# Patient Record
Sex: Female | Born: 1960 | Race: Black or African American | Hispanic: No | Marital: Married | Smoking: Never smoker
Health system: Southern US, Community
[De-identification: ages and names within clinical notes are randomized; demographics above are authoritative.]

## PROBLEM LIST (undated history)

## (undated) DIAGNOSIS — K219 Gastro-esophageal reflux disease without esophagitis: Secondary | ICD-10-CM

## (undated) DIAGNOSIS — H18609 Keratoconus, unspecified, unspecified eye: Secondary | ICD-10-CM

## (undated) DIAGNOSIS — I10 Essential (primary) hypertension: Secondary | ICD-10-CM

## (undated) DIAGNOSIS — E7800 Pure hypercholesterolemia, unspecified: Secondary | ICD-10-CM

## (undated) DIAGNOSIS — B019 Varicella without complication: Secondary | ICD-10-CM

## (undated) DIAGNOSIS — H269 Unspecified cataract: Secondary | ICD-10-CM

## (undated) HISTORY — DX: Unspecified cataract: H26.9

## (undated) HISTORY — PX: CORNEAL TRANSPLANT: SHX108

## (undated) HISTORY — DX: Varicella without complication: B01.9

## (undated) HISTORY — DX: Gastro-esophageal reflux disease without esophagitis: K21.9

## (undated) HISTORY — PX: BREAST SURGERY: SHX581

## (undated) HISTORY — PX: EYE SURGERY: SHX253

## (undated) HISTORY — DX: Keratoconus, unspecified, unspecified eye: H18.609

---

## 2012-02-09 ENCOUNTER — Emergency Department (HOSPITAL_COMMUNITY)
Admission: EM | Admit: 2012-02-09 | Discharge: 2012-02-09 | Disposition: A | Discharge status: Home / Self Care | Payer: BC Managed Care – PPO | Attending: Emergency Medicine | Admitting: Emergency Medicine

## 2012-02-09 ENCOUNTER — Emergency Department (HOSPITAL_COMMUNITY): Payer: BC Managed Care – PPO

## 2012-02-09 ENCOUNTER — Encounter (HOSPITAL_COMMUNITY): Payer: Self-pay | Admitting: Emergency Medicine

## 2012-02-09 DIAGNOSIS — I10 Essential (primary) hypertension: Secondary | ICD-10-CM | POA: Insufficient documentation

## 2012-02-09 DIAGNOSIS — S0520XA Ocular laceration and rupture with prolapse or loss of intraocular tissue, unspecified eye, initial encounter: Secondary | ICD-10-CM | POA: Insufficient documentation

## 2012-02-09 DIAGNOSIS — E7800 Pure hypercholesterolemia, unspecified: Secondary | ICD-10-CM | POA: Insufficient documentation

## 2012-02-09 DIAGNOSIS — T07XXXA Unspecified multiple injuries, initial encounter: Secondary | ICD-10-CM

## 2012-02-09 DIAGNOSIS — IMO0002 Reserved for concepts with insufficient information to code with codable children: Secondary | ICD-10-CM | POA: Insufficient documentation

## 2012-02-09 DIAGNOSIS — Y92410 Unspecified street and highway as the place of occurrence of the external cause: Secondary | ICD-10-CM | POA: Insufficient documentation

## 2012-02-09 DIAGNOSIS — Z23 Encounter for immunization: Secondary | ICD-10-CM | POA: Insufficient documentation

## 2012-02-09 DIAGNOSIS — S0530XA Ocular laceration without prolapse or loss of intraocular tissue, unspecified eye, initial encounter: Secondary | ICD-10-CM

## 2012-02-09 DIAGNOSIS — S0510XA Contusion of eyeball and orbital tissues, unspecified eye, initial encounter: Secondary | ICD-10-CM | POA: Insufficient documentation

## 2012-02-09 DIAGNOSIS — Y939 Activity, unspecified: Secondary | ICD-10-CM | POA: Insufficient documentation

## 2012-02-09 DIAGNOSIS — S0512XA Contusion of eyeball and orbital tissues, left eye, initial encounter: Secondary | ICD-10-CM

## 2012-02-09 HISTORY — DX: Pure hypercholesterolemia, unspecified: E78.00

## 2012-02-09 HISTORY — DX: Essential (primary) hypertension: I10

## 2012-02-09 MED ORDER — ONDANSETRON 8 MG PO TBDP
8.0000 mg | ORAL_TABLET | Freq: Once | ORAL | Status: AC
  Administered 2012-02-09: 8 mg via ORAL
  Filled 2012-02-09: qty 1

## 2012-02-09 MED ORDER — MOXIFLOXACIN HCL IN NACL 400 MG/250ML IV SOLN
400.0000 mg | Freq: Once | INTRAVENOUS | Status: AC
  Administered 2012-02-09: 400 mg via INTRAVENOUS
  Filled 2012-02-09: qty 250

## 2012-02-09 MED ORDER — TETANUS-DIPHTH-ACELL PERTUSSIS 5-2.5-18.5 LF-MCG/0.5 IM SUSP
0.5000 mL | Freq: Once | INTRAMUSCULAR | Status: AC
  Administered 2012-02-09: 0.5 mL via INTRAMUSCULAR
  Filled 2012-02-09: qty 0.5

## 2012-02-09 MED ORDER — LABETALOL HCL 5 MG/ML IV SOLN
10.0000 mg | Freq: Once | INTRAVENOUS | Status: AC
  Administered 2012-02-09: 10 mg via INTRAVENOUS
  Filled 2012-02-09: qty 4

## 2012-02-09 MED ORDER — NON FORMULARY: 400.0000 mg | Freq: Once | Status: DC

## 2012-02-09 MED ORDER — HYDROMORPHONE HCL PF 1 MG/ML IJ SOLN
1.0000 mg | Freq: Once | INTRAMUSCULAR | Status: AC
  Administered 2012-02-09: 1 mg via INTRAMUSCULAR
  Filled 2012-02-09: qty 1

## 2012-02-09 NOTE — ED Provider Notes (Signed)
Medical screening examination/treatment/procedure(s) were conducted as a shared visit with non-physician practitioner(s) and myself.  I personally evaluated the patient during the encounter  Pt with rupture globe --she requests to be transferred to [facility]   Leota Jacobsen, MD 02/09/12 1934

## 2012-02-09 NOTE — ED Notes (Signed)
Hard eye shield placed to LT eye by Dr. Lucita Ferrara
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
## 2012-02-09 NOTE — ED Notes (Signed)
MD Stonecipher at bedside.
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
## 2012-02-09 NOTE — ED Notes (Signed)
Pt's friend, Geoffery Spruce [phone] has been updated on pt status.
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
## 2012-02-11 NOTE — ED Provider Notes (Signed)
Medical screening examination/treatment/procedure(s) were performed by non-physician practitioner and as supervising physician I was immediately available for consultation/collaboration.  Leota Jacobsen, MD 02/11/12 2216

## 2013-06-10 ENCOUNTER — Ambulatory Visit: Payer: BC Managed Care – PPO | Admitting: Cardiovascular Disease

## 2013-07-03 DIAGNOSIS — I10 Essential (primary) hypertension: Secondary | ICD-10-CM | POA: Insufficient documentation

## 2013-07-03 DIAGNOSIS — E7800 Pure hypercholesterolemia, unspecified: Secondary | ICD-10-CM | POA: Insufficient documentation

## 2013-07-03 DIAGNOSIS — E785 Hyperlipidemia, unspecified: Secondary | ICD-10-CM | POA: Insufficient documentation

## 2013-07-04 ENCOUNTER — Ambulatory Visit (INDEPENDENT_AMBULATORY_CARE_PROVIDER_SITE_OTHER): Payer: Medicaid Other | Admitting: Cardiovascular Disease

## 2013-07-04 ENCOUNTER — Encounter: Payer: Self-pay | Admitting: Cardiovascular Disease

## 2013-07-04 VITALS — BP 130/88 | HR 82 | Ht 62.0 in | Wt 145.0 lb

## 2013-07-04 DIAGNOSIS — I447 Left bundle-branch block, unspecified: Secondary | ICD-10-CM

## 2013-07-04 DIAGNOSIS — E7800 Pure hypercholesterolemia, unspecified: Secondary | ICD-10-CM

## 2013-07-04 DIAGNOSIS — I10 Essential (primary) hypertension: Secondary | ICD-10-CM

## 2013-07-04 DIAGNOSIS — R079 Chest pain, unspecified: Secondary | ICD-10-CM

## 2013-07-04 NOTE — Assessment & Plan Note (Signed)
Persistent Comes on with mental and physical stress  With LBBB needs adenosine myovue to r/o CAD

## 2013-07-04 NOTE — Patient Instructions (Signed)
Your physician wants you to follow-up in:  [place] will receive a reminder letter in the mail two months in advance. If you don't receive a letter, please call our office to schedule the follow-up appointment. Your physician recommends that you continue on your current medications as directed. Please refer to the Current Medication list given to you today. Your physician has requested that you have an adenosine myoview. For further information please visit [email or web address]. Please follow instruction sheet, as given. Your physician has requested that you have an echocardiogram. Echocardiography is a painless test that uses sound waves to create images of your heart. It provides your doctor with information about the size and shape of your heart and how well your heart's chambers and valves are working. This procedure takes approximately one hour. There are no restrictions for this procedure.

## 2013-07-04 NOTE — Assessment & Plan Note (Signed)
Seems chronic  Check echo to r/o DCM

## 2013-07-04 NOTE — Progress Notes (Signed)
Patient ID: Latoya Thompson, female   DOB: 08-29-1960, 53 y.o.   MRN: [id number]   53 yo of [name].  Referred for SSCP and LBBB.  She is legally blind She was evaluated in 2009 in [place] for ? Same with normal cath.  Has Rx HTN.  SSCP is intermitant  Possibly monthly.  Comes on with exertion or mental stress.  Lasts minutes. Goes away on its own Central crampy pain radiates to shoulders.  No rest pain Not positional or pleuritic.  Patient indicates stable pattern for a long time No dyspnea or history of DCM  LBBB "old" with no palpitations AV block or syncope  She needs to arrange transportation due to her poor vision.     ROS: Denies fever, malais, weight loss, blurry vision, decreased visual acuity, cough, sputum, SOB, hemoptysis, pleuritic pain, palpitaitons, heartburn, abdominal pain, melena, lower extremity edema, claudication, or rash.  All other systems reviewed and negative   General: Affect appropriate Healthy:  appears stated age 62: no lens in left eye  Neck supple with no adenopathy JVP normal no bruits no thyromegaly Lungs clear with no wheezing and good diaphragmatic motion Heart:  S1/S2 no murmur,rub, gallop or click PMI normal Abdomen: benighn, BS positve, no tenderness, no AAA no bruit.  No HSM or HJR Distal pulses intact with no bruits No edema Neuro non-focal Skin warm and dry No muscular weakness  Medications Current Outpatient Prescriptions  Medication Sig Dispense Refill  . enalapril (VASOTEC) 20 MG tablet Take 20 mg by mouth daily.      . Iron TABS Take by mouth.      . Tetrahydrozoline HCl (EYE DROPS OP) Apply to eye.      . verapamil (CALAN) 120 MG tablet Take 120 mg by mouth 3 (three) times daily.      . vitamin C (ASCORBIC ACID) 500 MG tablet Take 500 mg by mouth daily.       No current facility-administered medications for this visit.    Allergies Codeine  Family History: No family history on file.  Social History: History   Social  History  . Marital Status: Single    Spouse Name: N/A    Number of Children: N/A  . Years of Education: N/A   Occupational History  . Not on file.   Social History Main Topics  . Smoking status: Never Smoker   . Smokeless tobacco: Not on file  . Alcohol Use: Not on file  . Drug Use: Not on file  . Sexual Activity: Not on file   Other Topics Concern  . Not on file   Social History Narrative  . No narrative on file    Electrocardiogram:  SR rate 83 LBBB   Assessment and Plan

## 2013-07-04 NOTE — Assessment & Plan Note (Signed)
Well controlled.  Continue current medications and low sodium Dash type diet.    

## 2013-07-04 NOTE — Assessment & Plan Note (Signed)
Cholesterol is at goal.  Continue current dose of statin and diet Rx.  No myalgias or side effects.  F/U  LFT's in 6 months. No results found for this basename: Barrera  Labs done by Dr Pricilla Handler

## 2013-07-21 ENCOUNTER — Ambulatory Visit (HOSPITAL_COMMUNITY): Payer: Medicaid Other | Attending: Cardiology | Admitting: Radiology

## 2013-07-21 ENCOUNTER — Ambulatory Visit (HOSPITAL_BASED_OUTPATIENT_CLINIC_OR_DEPARTMENT_OTHER): Payer: Medicaid Other | Admitting: Radiology

## 2013-07-21 VITALS — BP 148/98 | HR 66 | Ht 62.0 in | Wt 144.0 lb

## 2013-07-21 DIAGNOSIS — I447 Left bundle-branch block, unspecified: Secondary | ICD-10-CM | POA: Insufficient documentation

## 2013-07-21 DIAGNOSIS — R079 Chest pain, unspecified: Secondary | ICD-10-CM

## 2013-07-21 DIAGNOSIS — I10 Essential (primary) hypertension: Secondary | ICD-10-CM | POA: Diagnosis not present

## 2013-07-21 DIAGNOSIS — R072 Precordial pain: Secondary | ICD-10-CM

## 2013-07-21 MED ORDER — ADENOSINE (DIAGNOSTIC) 3 MG/ML IV SOLN
0.5600 mg/kg | Freq: Once | INTRAVENOUS | Status: AC
  Administered 2013-07-21: 36.6 mg via INTRAVENOUS

## 2013-07-21 MED ORDER — TECHNETIUM TC 99M SESTAMIBI GENERIC - CARDIOLITE
11.0000 | Freq: Once | INTRAVENOUS | Status: AC | PRN
  Administered 2013-07-21: 11 via INTRAVENOUS

## 2013-07-21 MED ORDER — TECHNETIUM TC 99M SESTAMIBI GENERIC - CARDIOLITE
33.0000 | Freq: Once | INTRAVENOUS | Status: AC | PRN
  Administered 2013-07-21: 33 via INTRAVENOUS

## 2013-07-21 NOTE — Progress Notes (Signed)
[place] [facility] NUCLEAR MED [place], [place] [address] [phone]    Cardiology Nuclear Med Study  Latoya Thompson is a 53 y.o. female     MRN : [id number]     DOB: 02-21-1961  Procedure Date: 07/21/2013  Nuclear Med Background Indication for Stress Test:  Evaluation for Ischemia History:  No known CAD, ?Cath in [place] (ok per pt), Echo 07/2013 (pending) Cardiac Risk Factors: Hypertension, LBBB and Lipids  Symptoms:  Chest Pain   Nuclear Pre-Procedure Caffeine/Decaff Intake:  None > 12 hrs NPO After: 5:30pm   Lungs:  clear O2 Sat: 98% on room air. IV 0.9% NS with Angio Cath:  22g  IV Site: L Antecubital x 1, tolerated well IV Started by:  Irven Baltimore, RN  Chest Size (in):  36 Cup Size: DD  Height: 5' 2"  (1.575 m)  Weight:  144 lb (65.318 kg)  BMI:  Body mass index is 26.33 kg/(m^2). Tech Comments:  Patient took Enalapril, and Verapamil this am per patient Irven Baltimore, RN.    Nuclear Med Study 1 or 2 day study: 1 day  Stress Test Type:  Adenosine  Reading MD: N/A  Order Authorizing Provider:  Jenkins Rouge, MD  Resting Radionuclide: Technetium 47mSestamibi  Resting Radionuclide Dose: 11.0 mCi   Stress Radionuclide:  Technetium 940mestamibi  Stress Radionuclide Dose: 33.0 mCi           Stress Protocol Rest HR: 66 Stress HR: 90  Rest BP: 148/98 Stress BP: 129/87  Exercise Time (min): n/a METS: n/a           Dose of Adenosine (mg):  36.7 mg Dose of Lexiscan: n/a mg  Dose of Atropine (mg): n/a Dose of Dobutamine: n/a mcg/kg/min (at max HR)  Stress Test Technologist: ShGlade LloydBS-ES  Nuclear Technologist:  StCharlton AmorCNMT     Rest Procedure:  Myocardial perfusion imaging was performed at rest 45 minutes following the intravenous administration of Technetium 9929mstamibi. Rest ECG: NSR-LBBB  Stress Procedure:  The patient received IV adenosine at 140 mcg/kg/min for 4 minutes.  Technetium 23m29mtamibi was injected at the 2  minute mark and quantitative spect images were obtained after a 45 minute delay.  During the infusion of Adenosine, the patient complained of feeling "funny" and SOB.  These symptoms completely resolved in recovery.  Stress ECG: Uninteretable due to baseline LBBB  QPS Raw Data Images:  Normal; no motion artifact; normal heart/lung ratio. Stress Images:  Normal homogeneous uptake in all areas of the myocardium. Rest Images:  Normal homogeneous uptake in all areas of the myocardium. Subtraction (SDS):  No evidence of ischemia. Transient Ischemic Dilatation (Normal <1.22):  0.96 Lung/Heart Ratio (Normal <0.45):  0.28  Quantitative Gated Spect Images QGS EDV:  n/a ml QGS ESV:  n/a ml  Impression Exercise Capacity:  Adenosine study with no exercise. BP Response:  Normal blood pressure response. Clinical Symptoms:  No chest pain. ECG Impression:  Baseline:  LBBB.  EKG uninterpretable due to LBBB at rest and stress. Comparison with Prior Nuclear Study: No images to compare  Overall Impression:  Low risk stress nuclear study. No ischemia or scar. LBBB is present.  LV Ejection Fraction: Study not gated.  LV Wall Motion:  Study is not gated.   [name]

## 2013-07-21 NOTE — Progress Notes (Signed)
Echocardiogram performed.  

## 2013-07-22 ENCOUNTER — Telehealth: Payer: Self-pay | Admitting: *Deleted

## 2013-07-22 NOTE — Telephone Encounter (Signed)
PT AWARE OF MYOVIEW  RESULTS./CY   Dierdre Forth More Detail >>      Josue Hector, MD      Sent: Tue Jul 22, 2013 10:24 AM      To: Richmond Campbell, LPN              Message      Normal myovue               Results   Myocardial Perfusion Imaging (Order [id number])          Result Information      Status Provider Status        Final result (07/22/2013  9:06 AM) Reviewed                Nuclear Med Result Note      Darlin Coco, MD at 07/21/2013  8:43 AM      Status: [facility] [address] [place], [place] [address] [phone]        Cardiology Nuclear Med Study   Latoya Thompson is a 53 y.o. female     MRN : [id number]     DOB: 1960-10-04   Procedure Date: 07/21/2013   Nuclear Med Background Indication for Stress Test:  Evaluation for Ischemia History:  No known CAD, ?Cath in [place] (ok per pt), Echo 07/2013 (pending) Cardiac Risk Factors: Hypertension, LBBB and Lipids   Symptoms:  Chest Pain     Nuclear Pre-Procedure Caffeine/Decaff Intake:  None > 12 hrs  NPO After: 5:30pm    Lungs:  clear O2 Sat: 98% on room air.  IV 0.9% NS with Angio Cath:  22g   IV Site: L Antecubital x 1, tolerated well  IV Started by:  Irven Baltimore, RN   Chest Size (in):  36  Cup Size: DD   Height: 5' 2"  (1.575 m)   Weight:  144 lb (65.318 kg)   BMI:  Body mass index is 26.33 kg/(m^2).  Tech Comments:  Patient took Enalapril, and Verapamil this am per patient Irven Baltimore, RN.        Nuclear Med Study 1 or 2 day study: 1 day   Stress Test Type:  Adenosine   Reading MD: N/A   Order Authorizing Provider:  Jenkins Rouge, MD   Resting Radionuclide: Technetium 29mSestamibi   Resting Radionuclide Dose: 11.0 mCi    Stress Radionuclide:  Technetium 967mestamibi   Stress Radionuclide Dose: 33.0 mCi              Stress Protocol Rest HR: 66  Stress HR: 90   Rest BP: 148/98  Stress BP: 129/87   Exercise Time  (min): n/a  METS: n/a                 Dose of Adenosine (mg):  36.7 mg  Dose of Lexiscan: n/a mg   Dose of Atropine (mg): n/a  Dose of Dobutamine: n/a mcg/kg/min (at max HR)   Stress Test Technologist: ShGlade LloydBS-ES   Nuclear Technologist:  StCharlton AmorCNMT         Rest Procedure:  Myocardial perfusion imaging was performed at rest 45 minutes following the intravenous administration of Technetium 9918mstamibi. Rest ECG: NSR-LBBB   Stress Procedure:  The patient received IV adenosine at 140 mcg/kg/min for 4 minutes.  Technetium 90m69mtamibi was injected at the 2 minute mark  and quantitative spect images were obtained after a 45 minute delay.  During the infusion of Adenosine, the patient complained of feeling "funny" and SOB.  These symptoms completely resolved in recovery.   Stress ECG: Uninteretable due to baseline LBBB   QPS Raw Data Images:  Normal; no motion artifact; normal heart/lung ratio. Stress Images:  Normal homogeneous uptake in all areas of the myocardium. Rest Images:  Normal homogeneous uptake in all areas of the myocardium. Subtraction (SDS):  No evidence of ischemia. Transient Ischemic Dilatation (Normal <1.22):  0.96 Lung/Heart Ratio (Normal <0.45):  0.28   Quantitative Gated Spect Images QGS EDV:  n/a ml QGS ESV:  n/a ml   Impression Exercise Capacity:  Adenosine study with no exercise. BP Response:  Normal blood pressure response. Clinical Symptoms:  No chest pain. ECG Impression:  Baseline:  LBBB.  EKG uninterpretable due to LBBB at rest and stress. Comparison with Prior Nuclear Study: No images to compare   Overall Impression:  Low risk stress nuclear study. No ischemia or scar. LBBB is present.   LV Ejection Fraction: Study not gated.  LV Wall Motion:  Study is not gated.     Darlin Coco MD                     Revision History       Date/Time User Action      > 07/21/2013  5:37 PM Darlin Coco, MD  Sign        07/21/2013  2:23 PM Ines Bloomer Sign at close encounter        07/21/2013 12:00 PM Veronia Beets Sign at close encounter        07/21/2013  8:53 AM Patsy Tora Perches, RN Sign at close encounter                         Myocardial Perfusion Imaging  Status: Final result     Visible to patient: This result is not viewable by the patient.     Next appt: None     Dx: LBBB (left bundle branch block); Ches...              Last Resulted: 07/22/13  9:06 AM Order Details View Encounter Lab and Collection Details Routing Result History                 Reviewed by List      Josue Hector, MD on 07/22/2013 10:24 AM           Order   Myocardial Perfusion Imaging [[id number]] (Order [id number])          Order Providers      Authorizing Encounter Billing      Collier Salina Deal Josue Hector                    Original Order      Ordered On Ordered By        Fri Jul 04, 2013  9:49 AM Richmond Campbell, LPN                       Associated Diagnoses      LBBB (left bundle branch block) [426.3]  - Primary         Chest pain [786.50]                   Order Questions  Question Answer Comment      Where should this test be performed [facility])        Type of stress Adenosine        Patient weight in lbs 145                  Appointments for this Order      07/21/2013  8:45 AM  - 15 min [facility] Nuclear Med               Additional Information      Associated Agricultural consultant and Order Details

## 2014-07-01 ENCOUNTER — Telehealth: Payer: Self-pay | Admitting: *Deleted

## 2014-07-01 NOTE — Telephone Encounter (Signed)
LM TO CALL BACK  PT  DUE FOR YEARLY  APPT  WITH DR Johnsie Cancel   IN April   WILL  AWAIT  PT'S RETURN CALL./CY

## 2014-07-08 NOTE — Telephone Encounter (Signed)
REMINDER SENT  TO PT .Latoya Thompson

## 2014-08-25 ENCOUNTER — Ambulatory Visit: Payer: PPO | Admitting: Podiatry

## 2014-09-02 ENCOUNTER — Ambulatory Visit: Payer: PPO | Admitting: Podiatry

## 2014-09-07 ENCOUNTER — Ambulatory Visit (INDEPENDENT_AMBULATORY_CARE_PROVIDER_SITE_OTHER): Payer: PPO | Admitting: Podiatry

## 2014-09-07 ENCOUNTER — Encounter: Payer: Self-pay | Admitting: Podiatry

## 2014-09-07 VITALS — BP 128/85 | HR 60 | Resp 15

## 2014-09-07 DIAGNOSIS — L600 Ingrowing nail: Secondary | ICD-10-CM

## 2014-09-07 NOTE — Progress Notes (Signed)
Subjective:     Patient ID: Latoya Thompson, female   DOB: 11/06/1960, 54 y.o.   MRN: [id number]  HPI patient presents with a thick damaged second nail left that she's had removed previously and it regrew and continues to give her trouble and she cannot cut it and it's sore   Review of Systems  All other systems reviewed and are negative.      Objective:   Physical Exam  Constitutional: She is oriented to person, place, and time.  Cardiovascular: Intact distal pulses.   Musculoskeletal: Normal range of motion.  Neurological: She is oriented to person, place, and time.  Skin: Skin is warm.  Nursing note and vitals reviewed.  neurovascular status intact muscle strength adequate range of motion within normal limits. Patient's noted to have a damaged second nail left that's an abnormal position is hard and painful distally when palpated. Patient's noted to have good digital perfusion and is well oriented 3     Assessment:     Damaged second nail left with incurvated bed and pain when palpated    Plan:     H&P and condition reviewed. At this point I recommended removal of the nail and I explained the surgery and risk and patient wants procedure. Understands risk and wants procedure and today I infiltrated 60 mg Xylocaine Marcaine mixture removed the second nail exposed matrix and applied phenol for applications 30 seconds followed by alcohol lavage and sterile dressing. Gave instructions on soaks and reappoint

## 2014-09-07 NOTE — Patient Instructions (Signed)

## 2014-09-07 NOTE — Progress Notes (Signed)
   Subjective:    Patient ID: Latoya Thompson, female    DOB: [birth year], 54 y.o.   MRN: [id number]  HPI Pt presents with left 2nd toe nail fungus, she has tried having the entire nail removed but nail grew back thick and discolored again. Has not tried any otc topicals   Review of Systems  All other systems reviewed and are negative.      Objective:   Physical Exam        Assessment & Plan:

## 2014-09-09 ENCOUNTER — Telehealth: Payer: Self-pay | Admitting: *Deleted

## 2014-09-09 NOTE — Telephone Encounter (Signed)
Entered in error

## 2014-09-09 NOTE — Telephone Encounter (Signed)
Pt asked how many days to soak toes.  I left a message informing pt to soak for 4-6 weeks or until the area got a dry hard scab.

## 2014-09-17 ENCOUNTER — Ambulatory Visit: Payer: PPO | Admitting: Podiatry

## 2014-11-11 ENCOUNTER — Ambulatory Visit: Payer: Self-pay | Admitting: Cardiovascular Disease

## 2015-02-17 ENCOUNTER — Ambulatory Visit (INDEPENDENT_AMBULATORY_CARE_PROVIDER_SITE_OTHER): Payer: PPO | Admitting: Podiatry

## 2015-02-17 ENCOUNTER — Encounter: Payer: Self-pay | Admitting: Podiatry

## 2015-02-17 DIAGNOSIS — L600 Ingrowing nail: Secondary | ICD-10-CM | POA: Diagnosis not present

## 2015-02-17 NOTE — Patient Instructions (Signed)

## 2015-02-18 NOTE — Progress Notes (Signed)
Subjective:     Patient ID: Latoya Thompson, female   DOB: May 17, 1960, 54 y.o.   MRN: [id number]  HPI patient presents with painful ingrown toenail left big toe medial border and states that she's tried to trim it herself without relief   Review of Systems  All other systems reviewed and are negative.      Objective:   Physical Exam  Constitutional: She is oriented to person, place, and time.  Cardiovascular: Intact distal pulses.   Musculoskeletal: Normal range of motion.  Neurological: She is oriented to person, place, and time.  Skin: Skin is warm.  Nursing note and vitals reviewed.  neurovascular status intact muscle strength adequate range of motion within normal limits with patient noted to have an incurvated left hallux medial border that's painful when pressed and she states she cannot trim it herself with distal redness noted but no drainage. Good digital perfusion noted well oriented 3     Assessment:     Ingrown toenail deformity left hallux medial border with pain    Plan:     H&P reviewed and condition discussed and recommended removal of the nail border. Explained procedure and risk and she wants procedure and today I infiltrated 60 mg Xylocaine Marcaine mixture and under sterile conditions remove the medial border exposing the matrix and applying phenol 3 applications 30 seconds followed by alcohol lavage and sterile dressing. He instructions on soaks and reappoint

## 2015-02-22 ENCOUNTER — Telehealth: Payer: Self-pay | Admitting: *Deleted

## 2015-02-22 NOTE — Telephone Encounter (Signed)
Called patient at [phone]  (Home #) to check to see how they were doing from their ingrown toenail procedure that was performed on Wednesday, February 17, 2015. Pt stated, "Doing better, but still tender". Pt is soaking toe with some relief. I advised patient to take ibuprofen if they were having any pain. Pt stated they understood.

## 2015-03-01 ENCOUNTER — Telehealth: Payer: Self-pay | Admitting: *Deleted

## 2015-03-01 NOTE — Telephone Encounter (Signed)
Pt states she had left 1st toenail procedure two weeks ago and is having increase of pain and was wondering if she could come in.  I left message instructing pt to change from betadine or antibacterial soaks to epsom salt 1/2 C to 1 Qt water 2 times daily and cover area with lightly covered Neosporin bandaid, and could air dry if resting and not in enclosed shoes.  Told pt to make an appt.

## 2015-03-02 ENCOUNTER — Ambulatory Visit (INDEPENDENT_AMBULATORY_CARE_PROVIDER_SITE_OTHER): Payer: PPO | Admitting: Podiatry

## 2015-03-02 ENCOUNTER — Encounter: Payer: Self-pay | Admitting: Podiatry

## 2015-03-02 VITALS — BP 129/89 | HR 76 | Resp 12

## 2015-03-02 DIAGNOSIS — Z09 Encounter for follow-up examination after completed treatment for conditions other than malignant neoplasm: Secondary | ICD-10-CM

## 2015-03-02 MED ORDER — CEPHALEXIN 500 MG PO CAPS: 500.0000 mg | ORAL_CAPSULE | Freq: Four times a day (QID) | ORAL | Status: DC

## 2015-03-02 NOTE — Progress Notes (Signed)
Subjective:     Patient ID: Latoya Thompson, female   DOB: [birth year], 54 y.o.   MRN: [id number]  HPIThis patient presents to the office saying she is experiencing intense pain inside border left big toe.  She says she had surgery two weeks ago and has been soaking her toe twice a day for 2 weeks.  She says there is severe pain and she is having difficulty wearing her shoes.  She presents to the office for evaluation and treatment.   Review of Systems     Objective:   Physical Exam GENERAL APPEARANCE: Alert, conversant. Appropriately groomed. No acute distress.  VASCULAR: Pedal pulses palpable at  [facility] and PT bilateral.  Capillary refill time is immediate to all digits,  Normal temperature gradient.  Digital hair growth is present bilateral  NEUROLOGIC: sensation is normal to 5.07 monofilament at 5/5 sites bilateral.  Light touch is intact bilateral, Muscle strength normal.  MUSCULOSKELETAL: acceptable muscle strength, tone and stability bilateral.  Intrinsic muscluature intact bilateral.  Rectus appearance of foot and digits noted bilateral.   DERMATOLOGIC: skin color, texture, and turgor are within normal limits.  No preulcerative lesions or ulcers  are seen, no interdigital maceration noted.  No open lesions present. . No drainage noted.  NAILS  Pain redness and swelling along the medial border left great toenail.  There is swelling with dark discoloration at the proximal nail fold.      Assessment:     S/p nail surgery.    Plan:     ROV  Debride necrotic tissue along medial border left great toe.  Neosporin/DSD.  Prescribed cephalexin  # 28  1 QID.  RTC prn  Told her to stay out of work until Friday.   Gardiner Barefoot DPM

## 2015-03-19 DIAGNOSIS — I10 Essential (primary) hypertension: Secondary | ICD-10-CM | POA: Diagnosis not present

## 2015-03-19 DIAGNOSIS — K219 Gastro-esophageal reflux disease without esophagitis: Secondary | ICD-10-CM | POA: Diagnosis not present

## 2015-04-14 ENCOUNTER — Telehealth: Payer: Self-pay | Admitting: *Deleted

## 2015-04-14 NOTE — Telephone Encounter (Signed)
Pt asked if she could get a pedicure or trim her toenails after the ingrown procedure before Christmas.  I told pt she could trim her toenails, and the we did not recommend pedicures if there were any scabbing, open wounds, bleeding or drainage of the toes or feet.

## 2015-05-03 ENCOUNTER — Ambulatory Visit: Payer: Self-pay | Admitting: Cardiovascular Disease

## 2015-05-03 DIAGNOSIS — E669 Obesity, unspecified: Secondary | ICD-10-CM | POA: Diagnosis not present

## 2015-05-03 DIAGNOSIS — G8929 Other chronic pain: Secondary | ICD-10-CM | POA: Diagnosis not present

## 2015-05-03 DIAGNOSIS — K219 Gastro-esophageal reflux disease without esophagitis: Secondary | ICD-10-CM | POA: Diagnosis not present

## 2015-05-03 DIAGNOSIS — M545 Low back pain: Secondary | ICD-10-CM | POA: Diagnosis not present

## 2015-05-22 DIAGNOSIS — M79601 Pain in right arm: Secondary | ICD-10-CM | POA: Diagnosis not present

## 2015-05-22 DIAGNOSIS — M62838 Other muscle spasm: Secondary | ICD-10-CM | POA: Diagnosis not present

## 2015-05-22 DIAGNOSIS — M25511 Pain in right shoulder: Secondary | ICD-10-CM | POA: Diagnosis not present

## 2015-05-25 NOTE — Progress Notes (Signed)
Patient ID: Latoya Thompson, female   DOB: [birth year], 55 y.o.   MRN: [id number]   55 y.o. of Latoya Thompson.  Referred for SSCP and LBBB in 2015 .  She is legally blind She was evaluated in 2009 in [place] for ? Same with normal cath.  Has Rx HTN.  SSCP is intermitant Possibly monthly.  Comes on with exertion or mental stress.  Lasts minutes. Goes away on its own Central crampy pain radiates to shoulders.  No rest pain Not positional or pleuritic.  Patient indicates stable pattern for a long time No dyspnea or history of DCM  LBBB "old" with no palpitations AV block or syncope  She needs to arrange transportation due to her poor vision.    Echo 07/21/13 reviewed Study Conclusions  - Left ventricle: The cavity size was normal. There was mild focal basal hypertrophy of the septum. Systolic function was mildly reduced. The estimated ejection fraction was in the range of 45% to 50%. Diffuse hypokinesis. Doppler parameters are consistent with abnormal left ventricular relaxation (grade 1 diastolic dysfunction). - Ventricular septum: Septal motion showed abnormal function and dyssynergy. These changes are consistent with a left bundle branch block. - Mitral valve: Mild regurgitation.  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  All other systems reviewed and negative   General: Affect appropriate Healthy:  appears stated age 55: no lens in left eye  Neck supple with no adenopathy JVP normal no bruits no thyromegaly Lungs clear with no wheezing and good diaphragmatic motion Heart:  S1/S2 no murmur,rub, gallop or click PMI  normal Abdomen: benighn, BS positve, no tenderness, no AAA no bruit.  No HSM or HJR Distal pulses intact with no bruits No edema Neuro non-focal Skin warm and dry No muscular weakness  Medications Current Outpatient Prescriptions  Medication Sig Dispense Refill  . acetaminophen (TYLENOL) 500 MG tablet Take 1,000 mg by mouth daily as needed. For pain    . cyclobenzaprine (FLEXERIL) 5 MG tablet Take 1 tablet by mouth 3 (three) times daily as needed. For muscle spasms    . enalapril (VASOTEC) 20 MG tablet Take 20 mg by mouth daily.    Marland Kitchen ibuprofen (ADVIL,MOTRIN) 800 MG tablet Take 1 tablet by mouth daily as needed. For pain    . lovastatin (MEVACOR) 10 MG tablet Take 10 mg by mouth at bedtime.    . pantoprazole (PROTONIX) 40 MG tablet Take 40 mg by mouth daily.    . prednisoLONE acetate (PRED FORTE) 1 % ophthalmic suspension Apply 1 drop to eye 2 (two) times daily.    . verapamil (CALAN) 120 MG tablet Take 120 mg by mouth daily.    . vitamin C (ASCORBIC ACID) 500 MG tablet Take 500 mg by mouth daily.     No current facility-administered medications for this visit.    Allergies Codeine  Family History: History reviewed. No pertinent family history.  Social History: Social History   Social History  . Marital Status: Single    Spouse Name: N/A  . Number of Children: N/A  . Years of Education: N/A   Occupational History  . Not on file.   Social History Main  Topics  . Smoking status: Never Smoker   . Smokeless tobacco: Not on file  . Alcohol Use: Not on file  . Drug Use: Not on file  . Sexual Activity: Not on file   Other Topics Concern  . Not on file   Social History Narrative    Electrocardiogram:  SR rate 83 LBBB   05/26/15  SR rate 71 LBBB no change  Assessment and Plan  HTN: Well controlled.  Continue current medications and low sodium Dash type diet.   LBBB: chronic no change in ECG no AV block yearly ECG Chest Pain:  Resolved normal myovue 2015 Cholesterol  on statin labs with primary  DCM:  Mild f/u echo for EF Glucose Intolerance:  F/u primary for A1c low carb diet discussed Cornea:  F/u Opthalmology for contact fitting Still unable to drive    Latoya Thompson

## 2015-05-26 ENCOUNTER — Encounter: Payer: Self-pay | Admitting: Cardiovascular Disease

## 2015-05-26 ENCOUNTER — Ambulatory Visit (INDEPENDENT_AMBULATORY_CARE_PROVIDER_SITE_OTHER): Payer: BLUE CROSS/BLUE SHIELD | Admitting: Cardiovascular Disease

## 2015-05-26 VITALS — BP 130/90 | HR 71 | Ht 63.0 in | Wt 178.0 lb

## 2015-05-26 DIAGNOSIS — I447 Left bundle-branch block, unspecified: Secondary | ICD-10-CM | POA: Diagnosis not present

## 2015-05-26 DIAGNOSIS — I429 Cardiomyopathy, unspecified: Secondary | ICD-10-CM

## 2015-05-26 NOTE — Patient Instructions (Signed)

## 2015-06-17 DIAGNOSIS — T86841 Corneal transplant failure: Secondary | ICD-10-CM | POA: Diagnosis not present

## 2015-06-17 DIAGNOSIS — H18603 Keratoconus, unspecified, bilateral: Secondary | ICD-10-CM | POA: Diagnosis not present

## 2015-06-17 DIAGNOSIS — H52212 Irregular astigmatism, left eye: Secondary | ICD-10-CM | POA: Diagnosis not present

## 2015-06-17 DIAGNOSIS — Y830 Surgical operation with transplant of whole organ as the cause of abnormal reaction of the patient, or of later complication, without mention of misadventure at the time of the procedure: Secondary | ICD-10-CM | POA: Diagnosis not present

## 2015-06-17 DIAGNOSIS — H40052 Ocular hypertension, left eye: Secondary | ICD-10-CM | POA: Diagnosis not present

## 2015-07-01 DIAGNOSIS — G5601 Carpal tunnel syndrome, right upper limb: Secondary | ICD-10-CM | POA: Diagnosis not present

## 2015-08-27 DIAGNOSIS — R05 Cough: Secondary | ICD-10-CM | POA: Diagnosis not present

## 2015-08-27 DIAGNOSIS — J302 Other seasonal allergic rhinitis: Secondary | ICD-10-CM | POA: Diagnosis not present

## 2015-08-29 DIAGNOSIS — R52 Pain, unspecified: Secondary | ICD-10-CM | POA: Diagnosis not present

## 2015-08-29 DIAGNOSIS — J0140 Acute pansinusitis, unspecified: Secondary | ICD-10-CM | POA: Diagnosis not present

## 2015-08-29 DIAGNOSIS — R509 Fever, unspecified: Secondary | ICD-10-CM | POA: Diagnosis not present

## 2015-09-02 ENCOUNTER — Emergency Department (HOSPITAL_COMMUNITY)
Admission: EM | Admit: 2015-09-02 | Discharge: 2015-09-03 | Disposition: A | Discharge status: Home / Self Care | Payer: BLUE CROSS/BLUE SHIELD | Attending: Emergency Medicine | Admitting: Emergency Medicine

## 2015-09-02 ENCOUNTER — Encounter (HOSPITAL_COMMUNITY): Payer: Self-pay | Admitting: Nurse Practitioner

## 2015-09-02 ENCOUNTER — Emergency Department (HOSPITAL_COMMUNITY): Payer: BLUE CROSS/BLUE SHIELD

## 2015-09-02 ENCOUNTER — Other Ambulatory Visit: Payer: Self-pay

## 2015-09-02 DIAGNOSIS — B349 Viral infection, unspecified: Secondary | ICD-10-CM | POA: Insufficient documentation

## 2015-09-02 DIAGNOSIS — R059 Cough, unspecified: Secondary | ICD-10-CM

## 2015-09-02 DIAGNOSIS — I10 Essential (primary) hypertension: Secondary | ICD-10-CM | POA: Insufficient documentation

## 2015-09-02 DIAGNOSIS — Z7951 Long term (current) use of inhaled steroids: Secondary | ICD-10-CM | POA: Diagnosis not present

## 2015-09-02 DIAGNOSIS — R61 Generalized hyperhidrosis: Secondary | ICD-10-CM | POA: Insufficient documentation

## 2015-09-02 DIAGNOSIS — Z79899 Other long term (current) drug therapy: Secondary | ICD-10-CM | POA: Diagnosis not present

## 2015-09-02 DIAGNOSIS — R072 Precordial pain: Secondary | ICD-10-CM | POA: Diagnosis not present

## 2015-09-02 DIAGNOSIS — R05 Cough: Secondary | ICD-10-CM

## 2015-09-02 LAB — CBC
HEMATOCRIT: 42.2 % (ref 36.0–46.0)
HEMOGLOBIN: 14.4 g/dL (ref 12.0–15.0)
MCH: 27.5 pg (ref 26.0–34.0)
MCHC: 34.1 g/dL (ref 30.0–36.0)
MCV: 80.5 fL (ref 78.0–100.0)
Platelets: 168 10*3/uL (ref 150–400)
RBC: 5.24 MIL/uL — AB (ref 3.87–5.11)
RDW: 14.8 % (ref 11.5–15.5)
WBC: 5.9 10*3/uL (ref 4.0–10.5)

## 2015-09-02 LAB — BASIC METABOLIC PANEL
Anion gap: 8 (ref 5–15)
BUN: 10 mg/dL (ref 6–20)
CHLORIDE: 106 mmol/L (ref 101–111)
CO2: 23 mmol/L (ref 22–32)
Calcium: 8.8 mg/dL — ABNORMAL LOW (ref 8.9–10.3)
Creatinine, Ser: 0.95 mg/dL (ref 0.44–1.00)
GFR calc non Af Amer: >60 mL/min (ref >60)
Glucose, Bld: 101 mg/dL — ABNORMAL HIGH (ref 65–99)
POTASSIUM: 3.5 mmol/L (ref 3.5–5.1)
SODIUM: 137 mmol/L (ref 135–145)

## 2015-09-02 LAB — I-STAT TROPONIN, ED: Troponin i, poc: 0 ng/mL (ref 0.00–0.08)

## 2015-09-02 NOTE — ED Notes (Signed)
Pt reports chest pain, sustained fever and chills that has been ongoing for the last 7 days. Has been seen at urgent care for URI and symptoms have not resolved.

## 2015-09-03 ENCOUNTER — Encounter (HOSPITAL_COMMUNITY): Payer: Self-pay | Admitting: Emergency Medicine

## 2015-09-03 DIAGNOSIS — B349 Viral infection, unspecified: Secondary | ICD-10-CM | POA: Diagnosis not present

## 2015-09-03 LAB — I-STAT TROPONIN, ED: Troponin i, poc: 0 ng/mL (ref 0.00–0.08)

## 2015-09-03 MED ORDER — IBUPROFEN 800 MG PO TABS
800.0000 mg | ORAL_TABLET | Freq: Once | ORAL | Status: AC
  Administered 2015-09-03: 800 mg via ORAL
  Filled 2015-09-03: qty 1

## 2015-09-03 MED ORDER — IBUPROFEN 800 MG PO TABS
800.0000 mg | ORAL_TABLET | Freq: Three times a day (TID) | ORAL | Status: DC
Start: 2015-09-03 — End: 2016-05-01

## 2015-09-03 MED ORDER — BENZONATATE 100 MG PO CAPS
200.0000 mg | ORAL_CAPSULE | Freq: Once | ORAL | Status: AC
  Administered 2015-09-03: 200 mg via ORAL
  Filled 2015-09-03: qty 2

## 2015-09-03 NOTE — Discharge Instructions (Signed)
Cool Mist Vaporizers  Vaporizers may help relieve the symptoms of a cough and cold. They add moisture to the air, which helps mucus to become thinner and less sticky. This makes it easier to breathe and cough up secretions. Cool mist vaporizers do not cause serious burns like hot mist vaporizers, which may also be called steamers or humidifiers. Vaporizers have not been proven to help with colds. You should not use a vaporizer if you are allergic to mold.  HOME CARE INSTRUCTIONS  · Follow the package instructions for the vaporizer.  · Do not use anything other than distilled water in the vaporizer.  · Do not run the vaporizer all of the time. This can cause mold or bacteria to grow in the vaporizer.  · Clean the vaporizer after each time it is used.  · Clean and dry the vaporizer well before storing it.  · Stop using the vaporizer if worsening respiratory symptoms develop.     This information is not intended to replace advice given to you by your health care provider. Make sure you discuss any questions you have with your health care provider.     Document Released: 11/25/2003 Document Revised: 03/04/2013 Document Reviewed: 07/17/2012  Elsevier Interactive Patient Education ©2016 [employer]

## 2015-09-03 NOTE — ED Provider Notes (Addendum)
CSN: [id number]     Arrival date & time 09/02/15  2037 History  By signing my name below, I, Irene Pap, attest that this documentation has been prepared under the direction and in the presence of Jade Burkard, MD. Electronically Signed: Irene Pap, ED Scribe. 09/03/2015. 12:37 AM.    Chief Complaint  Patient presents with  . Chest Pain  . Cough   Patient is a 55 y.o. female presenting with chest pain and cough. The history is provided by the patient. No language interpreter was used.  Chest Pain Pain location:  Substernal area Pain quality: aching   Pain radiates to:  Does not radiate Pain radiates to the back: no   Pain severity:  Mild Onset quality:  Gradual Duration:  1 week Timing:  Constant Progression:  Worsening Chronicity:  New Context: not breathing   Relieved by:  Nothing Worsened by:  Nothing tried Ineffective treatments:  None tried Associated symptoms: cough, diaphoresis, fever and headache   Associated symptoms: no nausea and not vomiting   Cough Associated symptoms: chest pain, chills, diaphoresis, fever and headaches   Associated symptoms: no rash and no sore throat   HPI Comments: Tyshawn Keel is a 55 y.o. Female with a hx of HTN who presents to the Emergency Department complaining of intermittent chest pain onset 7 days ago. Pt reports associated waxing and waning fever tmax 102 F, chills, and diaphoresis. She reports headache secondary to cough. Pt was seen at [facility] last Friday and diagnosed with allergies. She continued to have fever and chills that were only temporarily relieved with Tylenol. She was seen at [facility] again on Sunday and diagnosed with a sinus infection. She was discharged with antibiotics to no relief. She denies recent tick bites, sick contacts, recent surgery, recent long travel, leg swelling, vomiting, diarrhea, nausea, dysuria, or rash.   Past Medical History  Diagnosis Date  . Hypertension   . Hypercholesteremia    Past  Surgical History  Procedure Laterality Date  . Corneal transplant     History reviewed. No pertinent family history. Social History  Substance Use Topics  . Smoking status: Never Smoker   . Smokeless tobacco: None  . Alcohol Use: None   OB History    No data available     Review of Systems  Constitutional: Positive for fever, chills and diaphoresis.  HENT: Negative for sore throat.   Respiratory: Positive for cough.   Cardiovascular: Positive for chest pain. Negative for leg swelling.  Gastrointestinal: Negative for nausea, vomiting and diarrhea.  Genitourinary: Negative for dysuria.  Skin: Negative for rash.  Neurological: Positive for headaches.  All other systems reviewed and are negative.  Allergies  Codeine  Home Medications   Prior to Admission medications   Medication Sig Start Date End Date Taking? Authorizing Provider  acetaminophen (TYLENOL) 500 MG tablet Take 1,000 mg by mouth daily as needed. For pain    Historical Provider, MD  enalapril (VASOTEC) 20 MG tablet Take 20 mg by mouth daily.    Historical Provider, MD  ibuprofen (ADVIL,MOTRIN) 800 MG tablet Take 1 tablet by mouth daily as needed. For pain    Historical Provider, MD  lovastatin (MEVACOR) 10 MG tablet Take 10 mg by mouth at bedtime.    Historical Provider, MD  pantoprazole (PROTONIX) 40 MG tablet Take 40 mg by mouth daily. 03/19/15 03/18/16  Historical Provider, MD  prednisoLONE acetate (PRED FORTE) 1 % ophthalmic suspension Apply 1 drop to eye 2 (two) times daily. 11/05/14  Historical Provider, MD  verapamil (CALAN) 120 MG tablet Take 120 mg by mouth daily.    Historical Provider, MD  vitamin C (ASCORBIC ACID) 500 MG tablet Take 500 mg by mouth daily.    Historical Provider, MD   BP 120/82 mmHg  Pulse 96  Temp(Src) 99.3 F (37.4 C) (Oral)  Resp 18  SpO2 95% Physical Exam  Constitutional: She is oriented to person, place, and time. She appears well-developed and well-nourished. No distress.  HENT:   Head: Normocephalic and atraumatic.  Mouth/Throat: Oropharynx is clear and moist. No oropharyngeal exudate.  Trachea midline  Eyes: Conjunctivae and EOM are normal. Pupils are equal, round, and reactive to light.  Neck: Trachea normal and normal range of motion. Neck supple. No JVD present. Carotid bruit is not present. No tracheal deviation present.  Cardiovascular: Normal rate and regular rhythm.  Exam reveals no gallop and no friction rub.   No murmur heard. Pulmonary/Chest: Effort normal and breath sounds normal. No stridor. No respiratory distress. She has no wheezes. She has no rales.  Abdominal: Soft. Bowel sounds are normal. She exhibits no mass. There is no tenderness. There is no rebound and no guarding.  Musculoskeletal: Normal range of motion. She exhibits no edema.  Lymphadenopathy:    She has no cervical adenopathy.  Neurological: She is alert and oriented to person, place, and time. She has normal reflexes. No cranial nerve deficit. She exhibits normal muscle tone. Coordination normal.  Cranial nerves 2-12 intact  Skin: Skin is warm and dry. No rash noted. She is not diaphoretic. No erythema. No pallor.  No splinter hemorrhages no janeway lesions no osler nodes.   No rashes   Psychiatric: She has a normal mood and affect. Her behavior is normal.  Nursing note and vitals reviewed.   ED Course  Procedures (including critical care time) DIAGNOSTIC STUDIES: Oxygen Saturation is 95% on RA, adequate by my interpretation.    COORDINATION OF CARE: 1:05 AM-Discussed treatment plan which includes labs and chest x-ray with pt at bedside and pt agreed to plan.    Labs Review Labs Reviewed  BASIC METABOLIC PANEL - Abnormal; Notable for the following:    Glucose, Bld 101 (*)    Calcium 8.8 (*)    All other components within normal limits  CBC - Abnormal; Notable for the following:    RBC 5.24 (*)    All other components within normal limits  Randolm Idol, ED    Imaging  Review Dg Chest 2 View  09/02/2015  CLINICAL DATA:  55 year old female with chest pain EXAM: CHEST  2 VIEW COMPARISON:  None. FINDINGS: Two views of the chest demonstrate minimal bibasilar atelectatic changes of the lungs. There is no focal consolidation, pleural effusion, or pneumothorax. The cardiac silhouette is within normal limits. No acute osseous pathology. IMPRESSION: No active cardiopulmonary disease. Electronically Signed   By: Anner Crete M.D.   On: 09/02/2015 22:41   I have personally reviewed and evaluated these images and lab results as part of my medical decision-making.   EKG Interpretation None      MDM   Filed Vitals:   09/02/15 2138  BP: 120/82  Pulse: 96  Temp: 99.3 F (37.4 C)  Resp: 18   Medications - No data to display  Results for orders placed or performed during the hospital encounter of 67/54/49  Basic metabolic panel  Result Value Ref Range   Sodium 137 135 - 145 mmol/L   Potassium 3.5 3.5 - 5.1  mmol/L   Chloride 106 101 - 111 mmol/L   CO2 23 22 - 32 mmol/L   Glucose, Bld 101 (H) 65 - 99 mg/dL   BUN 10 6 - 20 mg/dL   Creatinine, Ser 0.95 0.44 - 1.00 mg/dL   Calcium 8.8 (L) 8.9 - 10.3 mg/dL   GFR calc non Af Amer >60 >60 mL/min   GFR calc Af Amer >60 >60 mL/min   Anion gap 8 5 - 15  CBC  Result Value Ref Range   WBC 5.9 4.0 - 10.5 K/uL   RBC 5.24 (H) 3.87 - 5.11 MIL/uL   Hemoglobin 14.4 12.0 - 15.0 g/dL   HCT 42.2 36.0 - 46.0 %   MCV 80.5 78.0 - 100.0 fL   MCH 27.5 26.0 - 34.0 pg   MCHC 34.1 30.0 - 36.0 g/dL   RDW 14.8 11.5 - 15.5 %   Platelets 168 150 - 400 K/uL  I-stat troponin, ED  Result Value Ref Range   Troponin i, poc 0.00 0.00 - 0.08 ng/mL   Comment 3           Dg Chest 2 View  09/02/2015  CLINICAL DATA:  55 year old female with chest pain EXAM: CHEST  2 VIEW COMPARISON:  None. FINDINGS: Two views of the chest demonstrate minimal bibasilar atelectatic changes of the lungs. There is no focal consolidation, pleural effusion,  or pneumothorax. The cardiac silhouette is within normal limits. No acute osseous pathology. IMPRESSION: No active cardiopulmonary disease. Electronically Signed   By: Anner Crete M.D.   On: 09/02/2015 22:41    Final diagnoses:  None    Filed Vitals:   09/02/15 2138  BP: 120/82  Pulse: 96  Temp: 99.3 F (37.4 C)  Resp: 18   Results for orders placed or performed during the hospital encounter of 28/36/62  Basic metabolic panel  Result Value Ref Range   Sodium 137 135 - 145 mmol/L   Potassium 3.5 3.5 - 5.1 mmol/L   Chloride 106 101 - 111 mmol/L   CO2 23 22 - 32 mmol/L   Glucose, Bld 101 (H) 65 - 99 mg/dL   BUN 10 6 - 20 mg/dL   Creatinine, Ser 0.95 0.44 - 1.00 mg/dL   Calcium 8.8 (L) 8.9 - 10.3 mg/dL   GFR calc non Af Amer >60 >60 mL/min   GFR calc Af Amer >60 >60 mL/min   Anion gap 8 5 - 15  CBC  Result Value Ref Range   WBC 5.9 4.0 - 10.5 K/uL   RBC 5.24 (H) 3.87 - 5.11 MIL/uL   Hemoglobin 14.4 12.0 - 15.0 g/dL   HCT 42.2 36.0 - 46.0 %   MCV 80.5 78.0 - 100.0 fL   MCH 27.5 26.0 - 34.0 pg   MCHC 34.1 30.0 - 36.0 g/dL   RDW 14.8 11.5 - 15.5 %   Platelets 168 150 - 400 K/uL  I-stat troponin, ED  Result Value Ref Range   Troponin i, poc 0.00 0.00 - 0.08 ng/mL   Comment 3          I-stat troponin, ED  Result Value Ref Range   Troponin i, poc 0.00 0.00 - 0.08 ng/mL   Comment 3           Dg Chest 2 View  09/02/2015  CLINICAL DATA:  55 year old female with chest pain EXAM: CHEST  2 VIEW COMPARISON:  None. FINDINGS: Two views of the chest demonstrate minimal bibasilar atelectatic changes of the lungs.  There is no focal consolidation, pleural effusion, or pneumothorax. The cardiac silhouette is within normal limits. No acute osseous pathology. IMPRESSION: No active cardiopulmonary disease. Electronically Signed   By: Anner Crete M.D.   On: 09/02/2015 22:41    EKG Interpretation  Date/Time:  Thursday September 02 2015 21:59:55 EDT Ventricular Rate:  93 PR Interval:     QRS Duration: 141 QT Interval:  394 QTC Calculation: 491 R Axis:   8 Text Interpretation:  Sinus rhythm Left bundle branch block Confirmed by [facility]  MD, Lonny Eisen ([id number]) on 09/03/2015 1:09:50 AM      LBBB seen on or before 01/2012 Well appearing with benign vitals and exam.  No travel nor tick exposure.  No camping.  Already on antibiotics for sinusitis.  Still having fevers.  A.  Has not been on antibiotics long enough if it is a bacterial infections.  B. I suspect this is an always has been a viral infection.    Alternate tylenol and ibuprofen.  Follow up with your PMD for recheck and further testing.  Strict return precautions    I personally performed the services described in this documentation, which was scribed in my presence. The recorded information has been reviewed and is accurate.      Veatrice Kells, MD 09/03/15 0240  Cigi Bega, MD 09/03/15 0246

## 2015-09-07 DIAGNOSIS — R509 Fever, unspecified: Secondary | ICD-10-CM | POA: Diagnosis not present

## 2015-09-07 DIAGNOSIS — R748 Abnormal levels of other serum enzymes: Secondary | ICD-10-CM | POA: Diagnosis not present

## 2015-09-07 DIAGNOSIS — R1011 Right upper quadrant pain: Secondary | ICD-10-CM | POA: Diagnosis not present

## 2015-09-07 DIAGNOSIS — R7989 Other specified abnormal findings of blood chemistry: Secondary | ICD-10-CM | POA: Diagnosis not present

## 2015-09-10 DIAGNOSIS — R1011 Right upper quadrant pain: Secondary | ICD-10-CM | POA: Diagnosis not present

## 2015-09-10 DIAGNOSIS — D1803 Hemangioma of intra-abdominal structures: Secondary | ICD-10-CM | POA: Diagnosis not present

## 2015-09-10 DIAGNOSIS — N393 Stress incontinence (female) (male): Secondary | ICD-10-CM | POA: Diagnosis not present

## 2015-09-16 DIAGNOSIS — R1011 Right upper quadrant pain: Secondary | ICD-10-CM | POA: Diagnosis not present

## 2015-09-16 DIAGNOSIS — R748 Abnormal levels of other serum enzymes: Secondary | ICD-10-CM | POA: Diagnosis not present

## 2015-09-16 DIAGNOSIS — N393 Stress incontinence (female) (male): Secondary | ICD-10-CM | POA: Diagnosis not present

## 2015-09-16 DIAGNOSIS — D1803 Hemangioma of intra-abdominal structures: Secondary | ICD-10-CM | POA: Diagnosis not present

## 2015-09-20 DIAGNOSIS — R7989 Other specified abnormal findings of blood chemistry: Secondary | ICD-10-CM | POA: Diagnosis not present

## 2015-09-20 DIAGNOSIS — Z1211 Encounter for screening for malignant neoplasm of colon: Secondary | ICD-10-CM | POA: Diagnosis not present

## 2015-09-20 DIAGNOSIS — K769 Liver disease, unspecified: Secondary | ICD-10-CM | POA: Diagnosis not present

## 2015-09-21 DIAGNOSIS — D1803 Hemangioma of intra-abdominal structures: Secondary | ICD-10-CM | POA: Diagnosis not present

## 2015-09-21 DIAGNOSIS — K219 Gastro-esophageal reflux disease without esophagitis: Secondary | ICD-10-CM | POA: Diagnosis not present

## 2015-09-28 DIAGNOSIS — E785 Hyperlipidemia, unspecified: Secondary | ICD-10-CM | POA: Diagnosis not present

## 2015-09-28 DIAGNOSIS — K219 Gastro-esophageal reflux disease without esophagitis: Secondary | ICD-10-CM | POA: Diagnosis not present

## 2015-10-04 DIAGNOSIS — Z0000 Encounter for general adult medical examination without abnormal findings: Secondary | ICD-10-CM | POA: Diagnosis not present

## 2015-10-04 DIAGNOSIS — K6289 Other specified diseases of anus and rectum: Secondary | ICD-10-CM | POA: Diagnosis not present

## 2015-10-04 DIAGNOSIS — D120 Benign neoplasm of cecum: Secondary | ICD-10-CM | POA: Diagnosis not present

## 2015-10-04 DIAGNOSIS — K900 Celiac disease: Secondary | ICD-10-CM | POA: Diagnosis not present

## 2015-10-04 DIAGNOSIS — Z1211 Encounter for screening for malignant neoplasm of colon: Secondary | ICD-10-CM | POA: Diagnosis not present

## 2015-10-04 DIAGNOSIS — D125 Benign neoplasm of sigmoid colon: Secondary | ICD-10-CM | POA: Diagnosis not present

## 2015-10-04 DIAGNOSIS — D126 Benign neoplasm of colon, unspecified: Secondary | ICD-10-CM | POA: Diagnosis not present

## 2015-10-14 DIAGNOSIS — H52212 Irregular astigmatism, left eye: Secondary | ICD-10-CM | POA: Diagnosis not present

## 2015-10-14 DIAGNOSIS — Z947 Corneal transplant status: Secondary | ICD-10-CM | POA: Diagnosis not present

## 2015-10-14 DIAGNOSIS — H18613 Keratoconus, stable, bilateral: Secondary | ICD-10-CM | POA: Diagnosis not present

## 2015-11-30 DIAGNOSIS — D1809 Hemangioma of other sites: Secondary | ICD-10-CM | POA: Diagnosis not present

## 2015-11-30 DIAGNOSIS — K769 Liver disease, unspecified: Secondary | ICD-10-CM | POA: Diagnosis not present

## 2015-11-30 DIAGNOSIS — N281 Cyst of kidney, acquired: Secondary | ICD-10-CM | POA: Diagnosis not present

## 2015-12-22 DIAGNOSIS — H18603 Keratoconus, unspecified, bilateral: Secondary | ICD-10-CM | POA: Diagnosis not present

## 2016-01-03 DIAGNOSIS — K219 Gastro-esophageal reflux disease without esophagitis: Secondary | ICD-10-CM | POA: Diagnosis not present

## 2016-01-03 DIAGNOSIS — R32 Unspecified urinary incontinence: Secondary | ICD-10-CM | POA: Diagnosis not present

## 2016-01-03 DIAGNOSIS — K7581 Nonalcoholic steatohepatitis (NASH): Secondary | ICD-10-CM | POA: Diagnosis not present

## 2016-01-25 ENCOUNTER — Telehealth: Payer: Self-pay | Admitting: Cardiovascular Disease

## 2016-01-25 NOTE — Telephone Encounter (Signed)
Closed encounter °

## 2016-01-28 ENCOUNTER — Other Ambulatory Visit (HOSPITAL_COMMUNITY): Payer: BLUE CROSS/BLUE SHIELD

## 2016-02-22 DIAGNOSIS — N3946 Mixed incontinence: Secondary | ICD-10-CM | POA: Diagnosis not present

## 2016-04-13 DIAGNOSIS — Z9889 Other specified postprocedural states: Secondary | ICD-10-CM | POA: Diagnosis not present

## 2016-04-13 DIAGNOSIS — H52211 Irregular astigmatism, right eye: Secondary | ICD-10-CM | POA: Diagnosis not present

## 2016-04-13 DIAGNOSIS — H18603 Keratoconus, unspecified, bilateral: Secondary | ICD-10-CM | POA: Diagnosis not present

## 2016-04-20 ENCOUNTER — Ambulatory Visit: Payer: BLUE CROSS/BLUE SHIELD | Admitting: Cardiovascular Disease

## 2016-04-20 ENCOUNTER — Other Ambulatory Visit (HOSPITAL_COMMUNITY): Payer: BLUE CROSS/BLUE SHIELD

## 2016-04-24 ENCOUNTER — Encounter: Payer: Self-pay | Admitting: Physician Assistant

## 2016-04-29 NOTE — Progress Notes (Signed)
Cardiology Office Note    Date:  05/01/2016   ID:  Ruben Pyka, DOB 01/19/1961, MRN [id number]  PCP:  Darden Amber, PA  Cardiologist:  Dr. Johnsie Cancel  CC: annual follow up   History of Present Illness:  Latoya Thompson is a 56 y.o. female with a history of LBBB, legal blindness s/p corneal transplant, HTN, mild LV dysfunction, HLD, and glucose intolerance who presents to clinic for follow up.   She has a history of SSCP. 2D ECHO in 2015 showed EF 45-50%, G1DD, mild MR. Myoview at that time with low risk with no ischemia. She was last seen by Dr. Johnsie Cancel in the office in 05/2015. 2D ECHO was ordered to assess LV function but never completed (done today).  Today she presents to clinic for follow up. Recently she has noted some worsening SOB with activity (like exercising or walking up stairs) or when lying flat. No LE edema, abdominal distension. Has gained weight. Sometimes she gets dizzy when bending over and standing up but no syncope. No palpitations. No blood in stool or urine. She recently bought a house which was stressful. She hasn't been exercising as much recently because of this.    Past Medical History:  Diagnosis Date  . Hypercholesteremia   . Hypertension     Past Surgical History:  Procedure Laterality Date  . CORNEAL TRANSPLANT      Current Medications: Outpatient Medications Prior to Visit  Medication Sig Dispense Refill  . enalapril (VASOTEC) 20 MG tablet Take 20 mg by mouth daily.    Marland Kitchen lovastatin (MEVACOR) 10 MG tablet Take 10 mg by mouth at bedtime.    . montelukast (SINGULAIR) 10 MG tablet Take 10 mg by mouth at bedtime.    . pantoprazole (PROTONIX) 40 MG tablet Take 40 mg by mouth daily.    . prednisoLONE acetate (PRED FORTE) 1 % ophthalmic suspension Apply 1 drop to eye 2 (two) times daily.    . verapamil (CALAN) 120 MG tablet Take 120 mg by mouth daily.    . vitamin C (ASCORBIC ACID) 500 MG tablet Take 500 mg by mouth daily.    Marland Kitchen acetaminophen (TYLENOL) 500  MG tablet Take 1,000 mg by mouth daily as needed. For pain    . amoxicillin-clavulanate (AUGMENTIN) 875-125 MG tablet Take 1 tablet by mouth 2 (two) times daily.    . fluticasone (FLONASE) 50 MCG/ACT nasal spray Place 2 sprays into both nostrils daily.    Marland Kitchen HYDROcodone-homatropine (HYCODAN) 5-1.5 MG/5ML syrup Take 5 mLs by mouth 2 (two) times daily as needed for cough.    Marland Kitchen ibuprofen (ADVIL,MOTRIN) 800 MG tablet Take 1 tablet by mouth daily as needed. For pain    . ibuprofen (ADVIL,MOTRIN) 800 MG tablet Take 1 tablet (800 mg total) by mouth 3 (three) times daily. (Patient not taking: Reported on 05/01/2016) 21 tablet 0   No facility-administered medications prior to visit.      Allergies:   Codeine   Social History   Social History  . Marital status: Married    Spouse name: N/A  . Number of children: N/A  . Years of education: N/A   Social History Main Topics  . Smoking status: Never Smoker  . Smokeless tobacco: Never Used  . Alcohol use No  . Drug use: No  . Sexual activity: Not Asked   Other Topics Concern  . None   Social History Narrative  . None     Family History:  The patient's mother had heart  problems. Her M aunt had a MI in late 39s.    ROS:   Please see the history of present illness.    ROS All other systems reviewed and are negative.   PHYSICAL EXAM:   VS:  BP 134/72   Pulse 70   Ht 5' 3"  (1.6 m)   Wt 180 lb (81.6 kg)   BMI 31.89 kg/m    GEN: Well nourished, well developed, in no acute distress  HEENT: normal  Neck: no JVD, carotid bruits, or masses Cardiac: RRR; no murmurs, rubs, or gallops,no edema  Respiratory:  clear to auscultation bilaterally, normal work of breathing GI: soft, nontender, nondistended, + BS MS: no deformity or atrophy  Skin: warm and dry, no rash Neuro:  Alert and Oriented x 3, Strength and sensation are intact Psych: euthymic mood, full affect    Wt Readings from Last 3 Encounters:  05/01/16 180 lb (81.6 kg)  05/26/15  178 lb (80.7 kg)  07/21/13 144 lb (65.3 kg)      Studies/Labs Reviewed:   EKG:  EKG is ordered today.  The ekg ordered today demonstrates NSR, HR 70, LAD, LBBB  Recent Labs: 09/02/2015: BUN 10; Creatinine, Ser 0.95; Hemoglobin 14.4; Platelets 168; Potassium 3.5; Sodium 137   Lipid Panel No results found for: CHOL, TRIG, HDL, CHOLHDL, VLDL, LDLCALC, LDLDIRECT  Additional studies/ records that were reviewed today include:  Echo 07/21/13 reviewed Study Conclusions - Left ventricle: The cavity size was normal. There was mild focal basal hypertrophy of the septum. Systolic function was mildly reduced. The estimated ejection fraction was in the range of 45% to 50%. Diffuse hypokinesis. Doppler parameters are consistent with abnormal left ventricular relaxation (grade 1 diastolic dysfunction). - Ventricular septum: Septal motion showed abnormal function and dyssynergy. These changes are consistent with a left bundle branch block. - Mitral valve: Mild regurgitation.  07/22/13 Myoview: Normal non ischemic myovue     ASSESSMENT & PLAN:   SOB: does not appear volume overloaded. Will check a BNP to assess for occult CHF. 2D ECHO pending. If both normal, I will do stress test for further work up  HTN: BP well controlled today. continue ARB, BMET done today at PCP, will have faxed here.   LBBB: stable.   LV dysfunction: 2D ECHO completed today but not read yet  HLD: continue statin. Followed by PCP. Labs to be faxed   Medication Adjustments/Labs and Tests Ordered: Current medicines are reviewed at length with the patient today.  Concerns regarding medicines are outlined above.  Medication changes, Labs and Tests ordered today are listed in the Patient Instructions below. Patient Instructions  Medication Instructions:  Your physician recommends that you continue on your current medications as directed. Please refer to the Current Medication list given to you  today.   Labwork: TODAY:  BNP   Testing/Procedures: None ordered  Follow-Up: Your physician wants you to follow-up in: [place], PA-C You will receive a reminder letter in the mail two months in advance. If you don't receive a letter, please call our office to schedule the follow-up appointment.    Any Other Special Instructions Will Be Listed Below (If Applicable).   If you need a refill on your cardiac medications before your next appointment, please call your pharmacy.      Signed, Angelena Form, PA-C  05/01/2016 3:07 PM    [facility] [address], [place], [place]  [address] Phone: [phone]; Fax: [phone]

## 2016-05-01 ENCOUNTER — Other Ambulatory Visit: Payer: Self-pay

## 2016-05-01 ENCOUNTER — Encounter: Payer: Self-pay | Admitting: Physician Assistant

## 2016-05-01 ENCOUNTER — Encounter (INDEPENDENT_AMBULATORY_CARE_PROVIDER_SITE_OTHER): Payer: Self-pay

## 2016-05-01 ENCOUNTER — Ambulatory Visit (INDEPENDENT_AMBULATORY_CARE_PROVIDER_SITE_OTHER): Payer: BLUE CROSS/BLUE SHIELD | Admitting: Physician Assistant

## 2016-05-01 ENCOUNTER — Ambulatory Visit (HOSPITAL_COMMUNITY): Payer: BLUE CROSS/BLUE SHIELD | Attending: Cardiology

## 2016-05-01 VITALS — BP 134/72 | HR 70 | Ht 63.0 in | Wt 180.0 lb

## 2016-05-01 DIAGNOSIS — J309 Allergic rhinitis, unspecified: Secondary | ICD-10-CM | POA: Diagnosis not present

## 2016-05-01 DIAGNOSIS — R7303 Prediabetes: Secondary | ICD-10-CM | POA: Diagnosis not present

## 2016-05-01 DIAGNOSIS — R0602 Shortness of breath: Secondary | ICD-10-CM | POA: Diagnosis not present

## 2016-05-01 DIAGNOSIS — I071 Rheumatic tricuspid insufficiency: Secondary | ICD-10-CM | POA: Insufficient documentation

## 2016-05-01 DIAGNOSIS — I429 Cardiomyopathy, unspecified: Secondary | ICD-10-CM | POA: Diagnosis not present

## 2016-05-01 DIAGNOSIS — R069 Unspecified abnormalities of breathing: Secondary | ICD-10-CM | POA: Diagnosis not present

## 2016-05-01 DIAGNOSIS — I447 Left bundle-branch block, unspecified: Secondary | ICD-10-CM | POA: Diagnosis not present

## 2016-05-01 DIAGNOSIS — E669 Obesity, unspecified: Secondary | ICD-10-CM | POA: Diagnosis not present

## 2016-05-01 DIAGNOSIS — E785 Hyperlipidemia, unspecified: Secondary | ICD-10-CM

## 2016-05-01 DIAGNOSIS — I10 Essential (primary) hypertension: Secondary | ICD-10-CM | POA: Diagnosis not present

## 2016-05-01 DIAGNOSIS — Z01818 Encounter for other preprocedural examination: Secondary | ICD-10-CM

## 2016-05-01 DIAGNOSIS — M722 Plantar fascial fibromatosis: Secondary | ICD-10-CM | POA: Diagnosis not present

## 2016-05-01 DIAGNOSIS — H6123 Impacted cerumen, bilateral: Secondary | ICD-10-CM | POA: Diagnosis not present

## 2016-05-01 LAB — ECHOCARDIOGRAM COMPLETE

## 2016-05-01 MED ORDER — PERFLUTREN LIPID MICROSPHERE
1.0000 mL | INTRAVENOUS | Status: AC | PRN
  Administered 2016-05-01: 2 mL via INTRAVENOUS

## 2016-05-01 NOTE — Patient Instructions (Addendum)
Medication Instructions:  Your physician recommends that you continue on your current medications as directed. Please refer to the Current Medication list given to you today.   Labwork: TODAY:  BNP   Testing/Procedures: None ordered  Follow-Up: Your physician wants you to follow-up in: [place], PA-C You will receive a reminder letter in the mail two months in advance. If you don't receive a letter, please call our office to schedule the follow-up appointment.    Any Other Special Instructions Will Be Listed Below (If Applicable).   If you need a refill on your cardiac medications before your next appointment, please call your pharmacy.

## 2016-05-02 ENCOUNTER — Telehealth: Payer: Self-pay | Admitting: *Deleted

## 2016-05-02 DIAGNOSIS — H6123 Impacted cerumen, bilateral: Secondary | ICD-10-CM | POA: Diagnosis not present

## 2016-05-02 DIAGNOSIS — R0602 Shortness of breath: Secondary | ICD-10-CM

## 2016-05-02 DIAGNOSIS — I10 Essential (primary) hypertension: Secondary | ICD-10-CM | POA: Diagnosis not present

## 2016-05-02 LAB — PRO B NATRIURETIC PEPTIDE: NT-Pro BNP: 24 pg/mL (ref 0–287)

## 2016-05-02 NOTE — Telephone Encounter (Signed)
-----   Message from Eileen Stanford, PA-C sent at 05/02/2016  6:14 AM EST ----- BNP totally normal and heart still very mildly weak with no changes from previous. Lets get her set up for a ETT myoview. If this is normal, SOB not coming from her heart.

## 2016-05-02 NOTE — Telephone Encounter (Signed)
Pt has been made aware of her lab results and that we recommend ex myoview for sob. Pt agreeable with this plan and verbalized understanding. She is aware we will order the ex myoview and someone from our scheduling dept will contact her to get it scheduled Pt will pick up instruction sheet at front desk.

## 2016-05-04 DIAGNOSIS — I10 Essential (primary) hypertension: Secondary | ICD-10-CM | POA: Diagnosis not present

## 2016-05-04 DIAGNOSIS — J101 Influenza due to other identified influenza virus with other respiratory manifestations: Secondary | ICD-10-CM | POA: Diagnosis not present

## 2016-05-04 DIAGNOSIS — H6691 Otitis media, unspecified, right ear: Secondary | ICD-10-CM | POA: Diagnosis not present

## 2016-05-07 ENCOUNTER — Emergency Department (HOSPITAL_COMMUNITY)
Admission: EM | Admit: 2016-05-07 | Discharge: 2016-05-07 | Disposition: A | Discharge status: Home / Self Care | Payer: BLUE CROSS/BLUE SHIELD | Attending: Emergency Medicine | Admitting: Emergency Medicine

## 2016-05-07 ENCOUNTER — Encounter (HOSPITAL_COMMUNITY): Payer: Self-pay | Admitting: Emergency Medicine

## 2016-05-07 DIAGNOSIS — B0221 Postherpetic geniculate ganglionitis: Secondary | ICD-10-CM | POA: Diagnosis not present

## 2016-05-07 DIAGNOSIS — Z79899 Other long term (current) drug therapy: Secondary | ICD-10-CM | POA: Insufficient documentation

## 2016-05-07 DIAGNOSIS — R29810 Facial weakness: Secondary | ICD-10-CM | POA: Diagnosis not present

## 2016-05-07 DIAGNOSIS — H02402 Unspecified ptosis of left eyelid: Secondary | ICD-10-CM | POA: Diagnosis not present

## 2016-05-07 DIAGNOSIS — R51 Headache: Secondary | ICD-10-CM | POA: Diagnosis not present

## 2016-05-07 DIAGNOSIS — I10 Essential (primary) hypertension: Secondary | ICD-10-CM | POA: Insufficient documentation

## 2016-05-07 MED ORDER — PREDNISONE 20 MG PO TABS
60.0000 mg | ORAL_TABLET | Freq: Once | ORAL | Status: AC
  Administered 2016-05-07: 60 mg via ORAL
  Filled 2016-05-07: qty 3

## 2016-05-07 MED ORDER — VALACYCLOVIR HCL 1 G PO TABS: 1000.0000 mg | ORAL_TABLET | Freq: Three times a day (TID) | ORAL | 0 refills | Status: AC

## 2016-05-07 MED ORDER — PREDNISONE 10 MG PO TABS: 20.0000 mg | ORAL_TABLET | Freq: Two times a day (BID) | ORAL | 0 refills | Status: DC

## 2016-05-07 MED ORDER — VALACYCLOVIR HCL 500 MG PO TABS
1000.0000 mg | ORAL_TABLET | Freq: Once | ORAL | Status: AC
  Administered 2016-05-07: 1000 mg via ORAL
  Filled 2016-05-07: qty 2

## 2016-05-07 MED ORDER — ACETAMINOPHEN 325 MG PO TABS
650.0000 mg | ORAL_TABLET | Freq: Once | ORAL | Status: AC
  Administered 2016-05-07: 650 mg via ORAL
  Filled 2016-05-07: qty 2

## 2016-05-07 NOTE — ED Provider Notes (Signed)
[facility] DEPT Provider Note   CSN: [id number] Arrival date & time: 05/07/16  1431     History   Chief Complaint Chief Complaint  Patient presents with  . Facial Swelling  . Facial Pain  . Eye Problem  . Otalgia    HPI Latoya Thompson is a 56 y.o. female.   She complains of right-sided headache, facial swelling, and ear pain for 3 days.  She was seen by her PCP and placed on both an oral antibiotic and antiviral medication.  She continues to have the same symptoms.  Yesterday she contacted a nurse referral who encouraged her to come here for evaluation of a stroke.  Today she went to an urgent care who again recommended that she come to be seen for a stroke.  She is able to eat but notices that that was drained from the right side of her mouth.  She states that food taste normally.  She denies trouble walking.  Her headache is mild.  No prior similar problems.  No history of shingles.  She does have a history of chickenpox as a child.  She denies chest pain shortness of breath nausea vomiting fever or chills.  There are no other no modifying factors.  HPI  Past Medical History:  Diagnosis Date  . Hypercholesteremia   . Hypertension     Patient Active Problem List   Diagnosis Date Noted  . LBBB (left bundle branch block) 07/04/2013  . Chest pain 07/04/2013  . Hypertension   . Hypercholesteremia     Past Surgical History:  Procedure Laterality Date  . CORNEAL TRANSPLANT      OB History    No data available       Home Medications    Prior to Admission medications   Medication Sig Start Date End Date Taking? Authorizing Provider  enalapril (VASOTEC) 20 MG tablet Take 20 mg by mouth daily.    Historical Provider, MD  lovastatin (MEVACOR) 10 MG tablet Take 10 mg by mouth at bedtime.    Historical Provider, MD  montelukast (SINGULAIR) 10 MG tablet Take 10 mg by mouth at bedtime.    Historical Provider, MD  pantoprazole (PROTONIX) 40 MG tablet Take 40 mg by mouth  daily. 03/19/15 05/01/16  Historical Provider, MD  prednisoLONE acetate (PRED FORTE) 1 % ophthalmic suspension Apply 1 drop to eye 2 (two) times daily. 11/05/14   Historical Provider, MD  verapamil (CALAN) 120 MG tablet Take 120 mg by mouth daily.    Historical Provider, MD  vitamin C (ASCORBIC ACID) 500 MG tablet Take 500 mg by mouth daily.    Historical Provider, MD  vitamin E 400 UNIT capsule Take 400 Units by mouth daily.    Historical Provider, MD    Family History No family history on file.  Social History Social History  Substance Use Topics  . Smoking status: Never Smoker  . Smokeless tobacco: Never Used  . Alcohol use No     Allergies   Codeine   Review of Systems Review of Systems  All other systems reviewed and are negative.    Physical Exam Updated Vital Signs BP 148/93 (BP Location: Left Arm)   Pulse 88   Temp 98.5 F (36.9 C) (Oral)   Resp 16   Ht 5' 3"  (1.6 m)   Wt 180 lb (81.6 kg)   SpO2 100%   BMI 31.89 kg/m   Physical Exam  Constitutional: She is oriented to person, place, and time. She appears well-developed  and well-nourished. No distress.  HENT:  Head: Normocephalic and atraumatic.  Right TM opaque and somewhat distorted.  Eyes: Conjunctivae and EOM are normal. Pupils are equal, round, and reactive to light.  Neck: Normal range of motion and phonation normal. Neck supple.  Cardiovascular: Normal rate and regular rhythm.   Pulmonary/Chest: Effort normal and breath sounds normal. She exhibits no tenderness.  Abdominal: Soft. She exhibits no distension. There is no tenderness. There is no guarding.  Musculoskeletal: Normal range of motion.  Neurological: She is alert and oriented to person, place, and time. She exhibits normal muscle tone.  No dysarthria or aphasia or nystagmus.  Moderate right facial droop including forehead cheek and upper neck.  She is able to almost completely close the right eye voluntarily.  No discoordination.  No pronator  drift.  Normal strength and sensation arms and legs bilaterally.  Skin: Skin is warm and dry.  Vesicular rash on a red base right ear inner helix, confluent area approximately 1.5 x 3 cm.  Scattered redness without clear vesicles of the right tympanic membrane.  No rash visualized on the right scalp.  Psychiatric: She has a normal mood and affect. Her behavior is normal. Judgment and thought content normal.  Nursing note and vitals reviewed.    ED Treatments / Results  Labs (all labs ordered are listed, but only abnormal results are displayed) Labs Reviewed - No data to display  EKG  EKG Interpretation None       Radiology No results found.  Procedures Procedures (including critical care time)  Medications Ordered in ED Medications  valACYclovir (VALTREX) tablet 1,000 mg (not administered)  predniSONE (DELTASONE) tablet 60 mg (not administered)     Initial Impression / Assessment and Plan / ED Course  I have reviewed the triage vital signs and the nursing notes.  Pertinent labs & imaging results that were available during my care of the patient were reviewed by me and considered in my medical decision making (see chart for details).     Medications  valACYclovir (VALTREX) tablet 1,000 mg (not administered)  predniSONE (DELTASONE) tablet 60 mg (not administered)    Patient Vitals for the past 24 hrs:  BP Temp Temp src Pulse Resp SpO2 Height Weight  05/07/16 1438 - - - - - - 5' 3"  (1.6 m) 180 lb (81.6 kg)  05/07/16 1437 148/93 98.5 F (36.9 C) Oral 88 16 100 % - -    At D/C Reevaluation with update and discussion. After initial assessment and treatment, an updated evaluation reveals no change in clinical status.  Findings discussed with patient and husband, all questions were answered. Nikesh Teschner L    Final Clinical Impressions(s) / ED Diagnoses   Final diagnoses:  None   Right facial weakness with right ear rash, consistent with shingles and Ramsay-Hunt  syndrome.  Doubt bacterial sinusitis.  Doubt influenza.  Doubt CVA, or TIA.  Patient is nontoxic.  She is able to get her right eye nearly fully closed.  Nursing Notes Reviewed/ Care Coordinated Applicable Imaging Reviewed Interpretation of Laboratory Data incorporated into ED treatment  The patient appears reasonably screened and/or stabilized for discharge and I doubt any other medical condition or other [facility] requiring further screening, evaluation, or treatment in the ED at this time prior to discharge.  Plan: Home Medications-stop amoxicillin and Tamiflu, ibuprofen for pain; Home Treatments-rest, fluids; return here if the recommended treatment, does not improve the symptoms; Recommended follow up-PCP checkup in 1 week consider neurology referral for  persistent right facial weakness    New Prescriptions New Prescriptions   No medications on file     Daleen Bo, MD 05/07/16 1718

## 2016-05-07 NOTE — ED Triage Notes (Signed)
Since Thursday

## 2016-05-07 NOTE — ED Triage Notes (Signed)
Pt. Stated, I went to doctor on Tuesday for an ear infection and a bunch of ear wax in my ear. Given tamiflu and said I had the flu.  Since then My face has started swelling n the right side and my balance feels off.

## 2016-05-07 NOTE — Discharge Instructions (Signed)
The pain in your ear and right facial weakness appears to be secondary to a shingles activation in the right facial nerve.  We are starting medications Valtrex and prednisone to hopefully speed recovery and limit the amount of long-term symptoms which you have.  There is no reason to continue taking the Tamiflu and amoxicillin at this time  It is important to follow-up with your primary care doctor in 1 week to see how you are doing.

## 2016-05-07 NOTE — ED Notes (Signed)
ED Provider at bedside. 

## 2016-05-17 DIAGNOSIS — Z947 Corneal transplant status: Secondary | ICD-10-CM | POA: Diagnosis not present

## 2016-05-17 NOTE — Progress Notes (Signed)
HPI:   [name] is a 56 y.o. female, who is here today with her husband to establish care.  Former PCP: Dr Latoya Thompson. Last preventive routine visit: 2-3 years ago.   She was evaluated in the ER recently Dx with Ramsay Hunt syndrome right side. According to pt a week earlier she was diagnosed with influenza (05/04/16), she was still having intense right earache. Treated with Prednisone and Valtrex. In general symptoms are improving. Still some numbness on right side of her tongue,mild decreased taste, and mild intermittent shooting right earache. Hearing is "better"after ear lavage done when symptoms first started.  Occasionally dizziness, spinning sensation. Dull headache right parietal and frontal, improved.She was also having occipital headache and this has resolved. No visual changes, nausea, or vomiting.  Chronic medical problems: HTN,keratoconus (follows with ophthalmologists),HLD, LBBB, and GERD among some. She followed with cornea specialist yesterday.   Also Hx of fatty liver,she follows with GI,Dr  Latoya Thompson seen 12/2015.She is supposed to follow annually. HCV NR 08/17/14 HIV NR HBsAg negative.  08/2015: Alk Phosphatase 183 (185,190,166) AST 119 (40,19,26) ALT 164 (86,76,72)  She denies hx of alcohol abuse.   Hypertension:   Currently on Enalapril 20 mg daily and Verapamil 120 mg daily.   Home BP's: 120's/70's.  She is taking medications as instructed, no side effects reported.  She has not noted unusual headache, visual changes, exertional chest pain, dyspnea, or edema.  05/01/16: Lab Results  Component Value    CREATININE 0.95    BUN 14    NA 146    K 4.2    CL 107    CO2 21      HLD:  She is currently on Lovastatin 20 mg daily. She is tolerating medication well, no side effects reported.  She has not been consistent with a healthy diet and she is not exercising regularly.  05/01/16: TC 216,HDL 45,TG 78, and LDL 155.    Review  of Systems  Constitutional: Negative for activity change, appetite change, fatigue, fever and unexpected weight change.  HENT: Positive for ear pain. Negative for facial swelling, mouth sores, nosebleeds, sore throat, trouble swallowing and voice change.   Eyes: Negative for pain, redness and visual disturbance.  Respiratory: Negative for cough, shortness of breath and wheezing.   Cardiovascular: Negative for chest pain, palpitations and leg swelling.  Gastrointestinal: Negative for abdominal pain, nausea and vomiting.       Negative for changes in bowel habits.  Genitourinary: Negative for decreased urine volume and hematuria.  Musculoskeletal: Negative for gait problem and myalgias.  Skin: Negative for rash.  Neurological: Positive for weakness (R facial) and numbness. Negative for seizures, syncope and headaches.  Psychiatric/Behavioral: Negative for confusion and sleep disturbance. The patient is not nervous/anxious.       Current Outpatient Prescriptions on File Prior to Visit  Medication Sig Dispense Refill  . enalapril (VASOTEC) 20 MG tablet Take 20 mg by mouth daily.    . montelukast (SINGULAIR) 10 MG tablet Take 10 mg by mouth at bedtime.    . prednisoLONE acetate (PRED FORTE) 1 % ophthalmic suspension Place 1 drop into the left eye daily.     . verapamil (CALAN) 120 MG tablet Take 120 mg by mouth daily.    . vitamin C (ASCORBIC ACID) 500 MG tablet Take 500 mg by mouth daily.    . vitamin E 400 UNIT capsule Take 400 Units by mouth daily.     No current facility-administered medications  on file prior to visit.      Past Medical History:  Diagnosis Date  . Chicken pox   . Hypercholesteremia   . Hypertension   . Keratoconus    Left   Allergies  Allergen Reactions  . Codeine Itching    Family History  Problem Relation Age of Onset  . Hyperlipidemia Mother   . Heart disease Mother   . Stroke Mother   . Hypertension Mother   . Diabetes Mother   . Hyperlipidemia  Father   . Heart disease Father   . Stroke Father   . Hypertension Father   . Diabetes Father     Social History   Social History  . Marital status: Married    Spouse name: N/A  . Number of children: N/A  . Years of education: N/A   Social History Main Topics  . Smoking status: Never Smoker  . Smokeless tobacco: Never Used  . Alcohol use No  . Drug use: No  . Sexual activity: Not Asked   Other Topics Concern  . None   Social History Narrative  . None    Vitals:   05/18/16 1402  BP: 128/70  Pulse: 82  Resp: 12   O2 sat at RA 96% Body mass index is 32.97 kg/m.  Physical Exam  Nursing note and vitals reviewed. Constitutional: She is oriented to person, place, and time. She appears well-developed. No distress.  HENT:  Head: Atraumatic.  Right Ear: Tympanic membrane is not erythematous and not bulging.  Left Ear: Tympanic membrane, external ear and ear canal normal.  Mouth/Throat: Oropharynx is clear and moist and mucous membranes are normal.  Right ear canal mild local erythema, no edema,I do not appreciate papular or vesicular lesions.No tenderness upon movement of auricula. No periauricular rash appreciated.  Eyes: Conjunctivae and EOM are normal. Left eye exhibits discharge (epiphora). No scleral icterus. Right pupil is round and reactive.  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 Right eye lid with minimal weakness, able to close and open eye.  Cardiovascular: Normal rate and regular rhythm.   No murmur heard. Pulses:      Dorsalis pedis pulses are 2+ on the right side, and 2+ on the left side.  Respiratory: Effort normal and breath sounds normal. No respiratory distress.  GI: Soft. She exhibits no mass. There is no hepatomegaly. There is no tenderness.  Musculoskeletal: She exhibits no edema.  Lymphadenopathy:    She has no cervical adenopathy.  Neurological: She is alert and oriented to person, place, and time. She has normal  strength. A cranial nerve deficit (R facial nerve distribution,rest negative) is present. Coordination and gait normal.  Skin: Skin is warm. No rash noted. No erythema.  Psychiatric: She has a normal mood and affect.  Well groomed, good eye contact.      ASSESSMENT AND PLAN:   Latoya Thompson was seen today for establish care.  Diagnoses and all orders for this visit:   Herpes zoster oticus  Improving. We discussed Dx criteria and prognosis. Monitor for warning signs. F/U as needed.  NASH (nonalcoholic steatohepatitis)  Further recommendations will be given according to labs results. Continue currently treatment. Keep f/u appt with GI.   -     Hepatic function panel  Essential hypertension  Adequately controlled. No changes in current management. DASH diet recommended.  F/U in 6 months, before if needed.  Hypercholesteremia  Stable. No changes in current management. F/U in 12 months.   BMI 32.0-32.9,adult  We discussed benefits of wt loss as well as adverse effects of obesity. Consistency with healthy diet and physical activity recommended. Daily brisk walking for 15-30 min as tolerated.    Jackston Oaxaca G. Martinique, MD  [facility] [place] office.

## 2016-05-18 ENCOUNTER — Ambulatory Visit (INDEPENDENT_AMBULATORY_CARE_PROVIDER_SITE_OTHER): Payer: BLUE CROSS/BLUE SHIELD | Admitting: Family Medicine

## 2016-05-18 ENCOUNTER — Encounter: Payer: Self-pay | Admitting: Family Medicine

## 2016-05-18 VITALS — BP 128/70 | HR 82 | Resp 12 | Ht 63.0 in | Wt 186.1 lb

## 2016-05-18 DIAGNOSIS — Z6832 Body mass index (BMI) 32.0-32.9, adult: Secondary | ICD-10-CM | POA: Diagnosis not present

## 2016-05-18 DIAGNOSIS — K7581 Nonalcoholic steatohepatitis (NASH): Secondary | ICD-10-CM | POA: Insufficient documentation

## 2016-05-18 DIAGNOSIS — E663 Overweight: Secondary | ICD-10-CM | POA: Insufficient documentation

## 2016-05-18 DIAGNOSIS — E7800 Pure hypercholesterolemia, unspecified: Secondary | ICD-10-CM | POA: Diagnosis not present

## 2016-05-18 DIAGNOSIS — I10 Essential (primary) hypertension: Secondary | ICD-10-CM | POA: Diagnosis not present

## 2016-05-18 DIAGNOSIS — B0221 Postherpetic geniculate ganglionitis: Secondary | ICD-10-CM

## 2016-05-18 LAB — HEPATIC FUNCTION PANEL
ALBUMIN: 4 g/dL (ref 3.5–5.2)
ALT: 15 U/L (ref 0–35)
AST: 15 U/L (ref 0–37)
Alkaline Phosphatase: 155 U/L — ABNORMAL HIGH (ref 39–117)
BILIRUBIN TOTAL: 0.4 mg/dL (ref 0.2–1.2)
Bilirubin, Direct: 0.1 mg/dL (ref 0.0–0.3)
Total Protein: 6.9 g/dL (ref 6.0–8.3)

## 2016-05-18 NOTE — Patient Instructions (Addendum)
A few things to remember from today's visit:   Essential hypertension  NASH (nonalcoholic steatohepatitis) - Plan: Hepatic function panel  Hypercholesteremia   Please be sure medication list is accurate. If a new problem present, please set up appointment sooner than planned today.

## 2016-05-18 NOTE — Progress Notes (Signed)
Pre visit review using our clinic review tool, if applicable. No additional management support is needed unless otherwise documented below in the visit note. 

## 2016-06-01 ENCOUNTER — Telehealth (HOSPITAL_COMMUNITY): Payer: Self-pay

## 2016-06-01 NOTE — Telephone Encounter (Signed)
Encounter complete. 

## 2016-06-06 ENCOUNTER — Inpatient Hospital Stay (HOSPITAL_COMMUNITY): Admission: RE | Admit: 2016-06-06 | Payer: BLUE CROSS/BLUE SHIELD | Source: Ambulatory Visit

## 2016-06-07 ENCOUNTER — Telehealth (HOSPITAL_COMMUNITY): Payer: Self-pay | Admitting: Physician Assistant

## 2016-06-07 NOTE — Telephone Encounter (Signed)
Pt cancelled on 3/27. Latoya Thompson Date/time: 06/07/2016 10:09 AM   Comment: Called pt and spoke with her and she voiced that she can not take anymore time off of work and will call back at a later date.   Context: [place] Schedule Orders/Appt Requests Outcome: Completed  Phone number: [phone] Phone Type: Mobile  Comm. type: Telephone Call type: Outgoing  Contact: Corliss Blacker A Relation to patient: Self  Letter:       She will be removed until she is able to find an appropriate date to get off of work.

## 2016-07-11 ENCOUNTER — Telehealth: Payer: Self-pay

## 2016-07-11 NOTE — Telephone Encounter (Signed)
Patient called to state that since this morning she has been very dizzy, nauseous, chest pain and elevated BP at 152/95. She felt so bad that she had to leave work. She is concerned about her symptoms. Advised pt that she needs to be seen in ED as s/s may be cardiac related given health history. Pt agreed and does have someone to drive her to ED.   Dr. Martinique - FYI. Thanks!

## 2016-07-13 ENCOUNTER — Emergency Department (HOSPITAL_COMMUNITY)
Admission: EM | Admit: 2016-07-13 | Discharge: 2016-07-13 | Disposition: A | Discharge status: Home / Self Care | Payer: BLUE CROSS/BLUE SHIELD | Attending: Emergency Medicine | Admitting: Emergency Medicine

## 2016-07-13 ENCOUNTER — Emergency Department (HOSPITAL_COMMUNITY): Payer: BLUE CROSS/BLUE SHIELD

## 2016-07-13 ENCOUNTER — Other Ambulatory Visit: Payer: Self-pay

## 2016-07-13 DIAGNOSIS — R0789 Other chest pain: Secondary | ICD-10-CM | POA: Insufficient documentation

## 2016-07-13 DIAGNOSIS — I10 Essential (primary) hypertension: Secondary | ICD-10-CM | POA: Diagnosis not present

## 2016-07-13 DIAGNOSIS — R42 Dizziness and giddiness: Secondary | ICD-10-CM

## 2016-07-13 DIAGNOSIS — Z79899 Other long term (current) drug therapy: Secondary | ICD-10-CM | POA: Diagnosis not present

## 2016-07-13 DIAGNOSIS — R079 Chest pain, unspecified: Secondary | ICD-10-CM | POA: Diagnosis not present

## 2016-07-13 LAB — CBC
HCT: 45.8 % (ref 36.0–46.0)
Hemoglobin: 15.5 g/dL — ABNORMAL HIGH (ref 12.0–15.0)
MCH: 28.2 pg (ref 26.0–34.0)
MCHC: 33.8 g/dL (ref 30.0–36.0)
MCV: 83.4 fL (ref 78.0–100.0)
PLATELETS: 195 10*3/uL (ref 150–400)
RBC: 5.49 MIL/uL — AB (ref 3.87–5.11)
RDW: 15.4 % (ref 11.5–15.5)
WBC: 7.1 10*3/uL (ref 4.0–10.5)

## 2016-07-13 LAB — BASIC METABOLIC PANEL
Anion gap: 7 (ref 5–15)
BUN: 12 mg/dL (ref 6–20)
CALCIUM: 9.4 mg/dL (ref 8.9–10.3)
CO2: 27 mmol/L (ref 22–32)
CREATININE: 0.98 mg/dL (ref 0.44–1.00)
Chloride: 107 mmol/L (ref 101–111)
Glucose, Bld: 91 mg/dL (ref 65–99)
Potassium: 3.9 mmol/L (ref 3.5–5.1)
SODIUM: 141 mmol/L (ref 135–145)

## 2016-07-13 LAB — I-STAT TROPONIN, ED
TROPONIN I, POC: 0 ng/mL (ref 0.00–0.08)
Troponin i, poc: 0 ng/mL (ref 0.00–0.08)

## 2016-07-13 MED ORDER — MECLIZINE HCL 25 MG PO TABS: 25.0000 mg | ORAL_TABLET | Freq: Three times a day (TID) | ORAL | 0 refills | Status: DC | PRN

## 2016-07-13 NOTE — ED Triage Notes (Signed)
Pt reports sternal chest pain that radiates to her back associated with dizziness, headache and nausea that started two days ago. She states she recently started working out.

## 2016-07-13 NOTE — ED Notes (Signed)
Patient Alert and oriented X4. Stable and ambulatory. Patient verbalized understanding of the discharge instructions.  Patient belongings were taken by the patient.  

## 2016-07-13 NOTE — ED Provider Notes (Signed)
[facility] DEPT Provider Note   CSN: [id number] Arrival date & time: 07/13/16  7412     History   Chief Complaint Chief Complaint  Patient presents with  . Chest Pain  . Dizziness    HPI Latoya Thompson is a 56 y.o. female.  HPI complains of anterior chest pain, nonradiating onset 2 days ago accompanied by dizziness meaning sensation of room spinning mild headache and nausea. Chest pain occurs when dizziness occurs. It is not exacerbated by walking or exertion. No shortness of breath. No visual changes noted trouble with speech or gait. No other associated symptoms. Chest pain is intermittent and lasts 30 minutes at a time, Past Medical History:  Diagnosis Date  . Chicken pox   . Hypercholesteremia   . Hypertension   . Keratoconus    Left    Patient Active Problem List   Diagnosis Date Noted  . NASH (nonalcoholic steatohepatitis) 05/18/2016  . BMI 32.0-32.9,adult 05/18/2016  . 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 LBBB (left bundle branch block) 07/04/2013  . Chest pain 07/04/2013  . Hypertension   . Hypercholesteremia     Past Surgical History:  Procedure Laterality Date  . BREAST SURGERY     biopsy  . CORNEAL TRANSPLANT      OB History    No data available       Home Medications    Prior to Admission medications   Medication Sig Start Date End Date Taking? Authorizing Provider  enalapril (VASOTEC) 20 MG tablet Take 20 mg by mouth daily.    Historical Provider, MD  lovastatin (MEVACOR) 20 MG tablet Take 20 mg by mouth daily.    Historical Provider, MD  montelukast (SINGULAIR) 10 MG tablet Take 10 mg by mouth at bedtime.    Historical Provider, MD  pantoprazole (PROTONIX) 20 MG tablet Take 20 mg by mouth daily.    Historical Provider, MD  prednisoLONE acetate (PRED FORTE) 1 % ophthalmic suspension Place 1 drop into the left eye daily.  11/05/14   Historical Provider, MD  verapamil (CALAN) 120 MG tablet Take 120 mg by mouth daily.    Historical  Provider, MD  vitamin C (ASCORBIC ACID) 500 MG tablet Take 500 mg by mouth daily.    Historical Provider, MD  vitamin E 400 UNIT capsule Take 400 Units by mouth daily.    Historical Provider, MD    Family History Family History  Problem Relation Age of Onset  . Hyperlipidemia Mother   . Heart disease Mother   . Stroke Mother   . Hypertension Mother   . Diabetes Mother   . Hyperlipidemia Father   . Heart disease Father   . Stroke Father   . Hypertension Father   . Diabetes Father    No history of MI in parents or immediate family members per patient  Social History Social History  Substance Use Topics  . Smoking status: Never Smoker  . Smokeless tobacco: Never Used  . Alcohol use No     Allergies   Codeine   Review of Systems Review of Systems  Constitutional: Negative.   HENT: Negative.   Respiratory: Positive for chest tightness.   Cardiovascular: Negative.   Gastrointestinal: Positive for nausea.  Musculoskeletal: Negative.   Skin: Negative.   Neurological: Positive for dizziness.  Psychiatric/Behavioral: Negative.   All other systems reviewed and are negative.    Physical Exam Updated Vital Signs BP (!) 145/82 (BP Location: Left Arm)   Pulse 73   Temp  98.1 F (36.7 C) (Oral)   Resp 17   SpO2 96%   Physical Exam  Constitutional: She is oriented to person, place, and time. She appears well-developed and well-nourished. No distress.  HENT:  Head: Normocephalic and atraumatic.  Eyes: Conjunctivae are normal. Pupils are equal, round, and reactive to light.  Neck: Neck supple. No tracheal deviation present. No thyromegaly present.  Cardiovascular: Normal rate and regular rhythm.   No murmur heard. Pulmonary/Chest: Effort normal and breath sounds normal.  Abdominal: Soft. Bowel sounds are normal. She exhibits no distension. There is no tenderness.  Musculoskeletal: Normal range of motion. She exhibits no edema or tenderness.  Neurological: She is alert  and oriented to person, place, and time. Coordination normal.  Gait normal Romberg normal pronator drift normal finger to nose normal nerves II through XII grossly intact  Skin: Skin is warm and dry. No rash noted.  Psychiatric: She has a normal mood and affect.  Nursing note and vitals reviewed.    ED Treatments / Results  Labs (all labs ordered are listed, but only abnormal results are displayed) Labs Reviewed  CBC - Abnormal; Notable for the following:       Result Value   RBC 5.49 (*)    Hemoglobin 15.5 (*)    All other components within normal limits  BASIC METABOLIC PANEL  I-STAT TROPOININ, ED    EKG  EKG Interpretation  Date/Time:  Thursday Jul 13 2016 17:49:32 EDT Ventricular Rate:  73 PR Interval:  176 QRS Duration: 142 QT Interval:  442 QTC Calculation: 486 R Axis:   174 Text Interpretation:  Normal sinus rhythm Right axis deviation Non-specific intra-ventricular conduction block Cannot rule out Anteroseptal infarct , age undetermined T wave abnormality, consider inferolateral ischemia Abnormal ECG No significant change since last tracing Confirmed by Winfred Leeds  MD, Jaquann Guarisco [phone]) on 07/13/2016 8:14:15 PM      Results for orders placed or performed during the hospital encounter of 55/73/22  Basic metabolic panel  Result Value Ref Range   Sodium 141 135 - 145 mmol/L   Potassium 3.9 3.5 - 5.1 mmol/L   Chloride 107 101 - 111 mmol/L   CO2 27 22 - 32 mmol/L   Glucose, Bld 91 65 - 99 mg/dL   BUN 12 6 - 20 mg/dL   Creatinine, Ser 0.98 0.44 - 1.00 mg/dL   Calcium 9.4 8.9 - 10.3 mg/dL   GFR calc non Af Amer >60 >60 mL/min   GFR calc Af Amer >60 >60 mL/min   Anion gap 7 5 - 15  CBC  Result Value Ref Range   WBC 7.1 4.0 - 10.5 K/uL   RBC 5.49 (H) 3.87 - 5.11 MIL/uL   Hemoglobin 15.5 (H) 12.0 - 15.0 g/dL   HCT 45.8 36.0 - 46.0 %   MCV 83.4 78.0 - 100.0 fL   MCH 28.2 26.0 - 34.0 pg   MCHC 33.8 30.0 - 36.0 g/dL   RDW 15.4 11.5 - 15.5 %   Platelets 195 150 - 400 K/uL   I-stat troponin, ED  Result Value Ref Range   Troponin i, poc 0.00 0.00 - 0.08 ng/mL   Comment 3          I-stat troponin, ED  Result Value Ref Range   Troponin i, poc 0.00 0.00 - 0.08 ng/mL   Comment 3           Dg Chest 2 View  Result Date: 07/13/2016 CLINICAL DATA:  56 y/o  F;  chest pain. EXAM: CHEST  2 VIEW COMPARISON:  09/02/2015 chest radiograph. FINDINGS: Stable heart size and mediastinal contours are within normal limits. Both lungs are clear. Mild reverse S curvature of the spine. IMPRESSION: No active cardiopulmonary disease. Electronically Signed   By: Kristine Garbe M.D.   On: 07/13/2016 18:52   Chest xray viewed by me Radiology Dg Chest 2 View  Result Date: 07/13/2016 CLINICAL DATA:  56 y/o  F; chest pain. EXAM: CHEST  2 VIEW COMPARISON:  09/02/2015 chest radiograph. FINDINGS: Stable heart size and mediastinal contours are within normal limits. Both lungs are clear. Mild reverse S curvature of the spine. IMPRESSION: No active cardiopulmonary disease. Electronically Signed   By: Kristine Garbe M.D.   On: 07/13/2016 18:52    Procedures Procedures (including critical care time)  Medications Ordered in ED Medications - No data to display   Initial Impression / Assessment and Plan / ED Course  I have reviewed the triage vital signs and the nursing notes.  Pertinent labs & imaging results that were available during my care of the patient were reviewed by me and considered in my medical decision making (see chart for details).     11:10 PM patient resting comfortably. Atypical sounding story for acute coronary syndrome. Heart score equals 3. Vertigo likely peripheral in etiology with nonfocal neurologic exam Plan prescription meclizine. Follow-up with Dr. Martinique, patient's PMD if vertigo persists in 2 or 3 days. Follow-up with [facility] to schedule stress test   Final Clinical Impressions(s) / ED Diagnoses  Diagnosis #1 vertigo  #2  atypical chest pain  Final diagnoses:  None    New Prescriptions New Prescriptions   No medications on file     Orlie Dakin, MD 07/13/16 2359

## 2016-07-13 NOTE — Discharge Instructions (Signed)
Take the medication prescribed as needed for dizziness. No driving while dizzy or while taking the medication prescribed. Call Dr. Martinique to arrange to be seen in the office if you continue to have dizziness by next week. Call Dr. Kyla Balzarine office tomorrow to schedule a stress test. Return if concern for any reason

## 2016-07-20 DIAGNOSIS — H18603 Keratoconus, unspecified, bilateral: Secondary | ICD-10-CM | POA: Diagnosis not present

## 2016-07-20 DIAGNOSIS — H52211 Irregular astigmatism, right eye: Secondary | ICD-10-CM | POA: Diagnosis not present

## 2016-07-20 DIAGNOSIS — T85612A Breakdown (mechanical) of permanent sutures, initial encounter: Secondary | ICD-10-CM | POA: Diagnosis not present

## 2016-07-20 DIAGNOSIS — Z947 Corneal transplant status: Secondary | ICD-10-CM | POA: Diagnosis not present

## 2016-07-24 NOTE — Progress Notes (Signed)
HPI:   [name] is a 56 y.o. female, who is here today to follow on recent ER visit.   She was seen on 07/13/16 because 2 days of dizziness and chest pain. Chest pain has resolved. Still having dizzy spells, sensation of  room moving, while she stands up, feels like she is walking toward the sides. She has no symptoms while she is in bed or with head movements.  Mild associated headache and nausea. She denies associated visual changes, hearing loss, tinnitus, dyspnea, palpitation, abdominal pain, vomiting, or focal weakness.  Meclizine has helped some, wonders if headache is caused by this medication. She also states that she had headache with dizziness. Frontal dull headache, mild.  She was treated for Zoster infection in 05/2016, which affected right ear; she was reporting occasional dizziness last OV (05/18/16).  CXR negative for cardiopulmonary disease.   Lab Results  Component Value Date   WBC 7.1 07/13/2016   HGB 15.5 (H) 07/13/2016   HCT 45.8 07/13/2016   MCV 83.4 07/13/2016   PLT 195 07/13/2016   Lab Results  Component Value Date   CREATININE 0.98 07/13/2016   BUN 12 07/13/2016   NA 141 07/13/2016   K 3.9 07/13/2016   CL 107 07/13/2016   CO2 27 07/13/2016   07/2013 myocardiac perfusion imaging negative. She follows with Dr Latoya Thompson, cardiologists. Hx of LBBB, HTN,and HLD.  She denies alcohol abuse, drinks alcohol during weekends, a glass of wine.  She wonders if her abnormal LFT's is causing symptoms.  Lab Results  Component Value Date   ALT 15 05/18/2016   AST 15 05/18/2016   ALKPHOS 155 (H) 05/18/2016   BILITOT 0.4 05/18/2016    Denies abdominal pain, nausea, vomiting, changes in bowel habits, color changes in stool,jaundance, or abnormal wt loss.   Review of Systems  Constitutional: Negative for activity change, appetite change, fatigue, fever and unexpected weight change.  HENT: Negative for congestion, hearing loss, mouth sores,  nosebleeds, sore throat, tinnitus and trouble swallowing.   Eyes: Negative for photophobia and pain.  Respiratory: Negative for cough, shortness of breath and wheezing.   Cardiovascular: Negative for chest pain, palpitations and leg swelling.  Gastrointestinal: Positive for nausea. Negative for abdominal pain and vomiting.       No changes in bowel habits.  Endocrine: Negative for polydipsia and polyphagia.  Genitourinary: Negative for decreased urine volume and dysuria.  Musculoskeletal: Positive for gait problem. Negative for myalgias.  Skin: Negative for color change and rash.  Allergic/Immunologic: Positive for environmental allergies.  Neurological: Positive for dizziness and headaches. Negative for tremors, syncope, speech difficulty, weakness and numbness.  Hematological: Negative for adenopathy. Does not bruise/bleed easily.  Psychiatric/Behavioral: Negative for confusion and sleep disturbance. The patient is not nervous/anxious.       Current Outpatient Prescriptions on File Prior to Visit  Medication Sig Dispense Refill  . enalapril (VASOTEC) 20 MG tablet Take 20 mg by mouth daily.    Marland Kitchen lovastatin (MEVACOR) 20 MG tablet Take 20 mg by mouth daily.    . montelukast (SINGULAIR) 10 MG tablet Take 10 mg by mouth at bedtime.    . pantoprazole (PROTONIX) 20 MG tablet Take 20 mg by mouth daily.    . prednisoLONE acetate (PRED FORTE) 1 % ophthalmic suspension Place 1 drop into the left eye daily.     . verapamil (CALAN) 120 MG tablet Take 120 mg by mouth daily.    . vitamin C (ASCORBIC ACID)  500 MG tablet Take 500 mg by mouth daily.    . vitamin E 400 UNIT capsule Take 400 Units by mouth daily.     No current facility-administered medications on file prior to visit.      Past Medical History:  Diagnosis Date  . Chicken pox   . Hypercholesteremia   . Hypertension   . Keratoconus    Left   Allergies  Allergen Reactions  . Codeine Itching    Social History   Social  History  . Marital status: Married    Spouse name: N/A  . Number of children: N/A  . Years of education: N/A   Social History Main Topics  . Smoking status: Never Smoker  . Smokeless tobacco: Never Used  . Alcohol use No  . Drug use: No  . Sexual activity: Not Asked   Other Topics Concern  . None   Social History Narrative  . None    Vitals:   07/25/16 1532  BP: 120/82  Pulse: 82  Resp: 12   Body mass index is 32.99 kg/m.   Physical Exam  Nursing note and vitals reviewed. Constitutional: She is oriented to person, place, and time. She appears well-developed. She does not appear ill. No distress.  HENT:  Head: Atraumatic.  Right Ear: Hearing, tympanic membrane, external ear and ear canal normal.  Left Ear: Hearing, tympanic membrane, external ear and ear canal normal.  Mouth/Throat: Oropharynx is clear and moist and mucous membranes are normal.  Apley maneuver neg Nystagmus neg  Eyes: Conjunctivae are normal. Left pupil is not round and not reactive.  Neck: Carotid bruit is not present. No tracheal deviation present. No thyromegaly present.  Cardiovascular: Normal rate and regular rhythm.   No murmur heard. Pulses:      Dorsalis pedis pulses are 2+ on the right side, and 2+ on the left side.  Respiratory: Effort normal and breath sounds normal. No respiratory distress.  GI: Soft. She exhibits no mass. There is no hepatomegaly. There is no tenderness.  Musculoskeletal: She exhibits no edema or tenderness.  Lymphadenopathy:    She has no cervical adenopathy.  Neurological: She is alert and oriented to person, place, and time. She has normal strength. No cranial nerve deficit or sensory deficit. Coordination and gait normal.  Reflex Scores:      Patellar reflexes are 2+ on the right side and 2+ on the left side. Romberg ? Pos towards right. Pronator drift negative.  Skin: Skin is warm. No rash noted. No cyanosis or erythema.  Psychiatric: She has a normal mood and  affect. Cognition and memory are normal.  Well groomed, good eye contact.    ASSESSMENT AND PLAN:  Latoya Thompson was seen today for hospitalization follow-up.  Diagnoses and all orders for this visit:  Dizziness, nonspecific  We discussed possible causes of dizziness including cardiac,neurologic,vertigo among some. Because on examination today question of Romberg test positive, tilted towards right and lost balance. Rest of neurologic examination normal, neuro evaluation was recommended. Fall precautions discussed. Some side effects of Meclizine discussed. Instructed about warning signs.  -     Ambulatory referral to Neurology  -     meclizine (ANTIVERT) 25 MG tablet; Take 1 tablet (25 mg total) by mouth 3 (three) times daily as needed for dizziness or nausea.  Ramsay Hunt auricular syndrome  05/2016, explained that this could also cause some vestibular abnormalities. On examination skin lesions or TM abnormalities were appreciated. No hearing loss or tinnitus.  Chest pain, unspecified type  Resolved. Instructed about warning signs. Keep next appt with cardiologists.   NASH (nonalcoholic steatohepatitis)  I do not think dizziness she is reporting today is related to this problem. We reviewed prior hepatic labs, alk phosphatase still elevated but improved. 08/2015 alk phos 183 (185),AST 119, and ALT 164 (38). Continue Vit E and wt loss as well as low fat diet      Shalaine Payson G. Martinique, MD  [facility]. [place] office.

## 2016-07-25 ENCOUNTER — Encounter: Payer: Self-pay | Admitting: Family Medicine

## 2016-07-25 ENCOUNTER — Ambulatory Visit (INDEPENDENT_AMBULATORY_CARE_PROVIDER_SITE_OTHER): Payer: BLUE CROSS/BLUE SHIELD | Admitting: Family Medicine

## 2016-07-25 VITALS — BP 120/82 | HR 82 | Resp 12 | Ht 63.0 in | Wt 186.2 lb

## 2016-07-25 DIAGNOSIS — R42 Dizziness and giddiness: Secondary | ICD-10-CM | POA: Diagnosis not present

## 2016-07-25 DIAGNOSIS — B0221 Postherpetic geniculate ganglionitis: Secondary | ICD-10-CM

## 2016-07-25 DIAGNOSIS — K7581 Nonalcoholic steatohepatitis (NASH): Secondary | ICD-10-CM

## 2016-07-25 DIAGNOSIS — R079 Chest pain, unspecified: Secondary | ICD-10-CM

## 2016-07-25 MED ORDER — MECLIZINE HCL 25 MG PO TABS: 25.0000 mg | ORAL_TABLET | Freq: Three times a day (TID) | ORAL | 0 refills | Status: DC | PRN

## 2016-07-25 MED ORDER — MECLIZINE HCL 25 MG PO TABS: 25.0000 mg | ORAL_TABLET | Freq: Three times a day (TID) | ORAL | 0 refills | Status: AC | PRN

## 2016-07-25 NOTE — Patient Instructions (Signed)
A few things to remember from today's visit:   Dizziness, nonspecific - Plan: Ambulatory referral to Neurology, meclizine (ANTIVERT) 25 MG tablet  Fall precautions.   Dizziness is a perception of movement, it is sometimes difficult to describe and can be  caused by different problems, most benign but others can be life threaten.  Vertigo is the most common cause of dizziness, usually related with inner ear and can be associated with nausea, vomiting, and unbalance sensation. It can be complicated by falls due to lose of balance; so fall precautions are very important.    Sometimes blood work is ordered to evaluate for other possible causes.  Dizziness can also be caused by certain medications, dehydration, migraines, and strokes.  Medication prescribed for vertigo, Meclizine, causes drowsiness/sleepiness, so frequently I recommended taking it at bedtime.   Seek immediate medical attention if: New severe headache, dobble vision, fever (100 F or more), associated numbness/tingling, focal weakness, persistent vomiting, not able to walk, or sudden worsening symptoms.  Please be sure medication list is accurate. If a new problem present, please set up appointment sooner than planned today.

## 2016-07-27 ENCOUNTER — Encounter: Payer: Self-pay | Admitting: Neurology

## 2016-10-09 ENCOUNTER — Other Ambulatory Visit: Payer: Self-pay

## 2016-10-09 MED ORDER — MONTELUKAST SODIUM 10 MG PO TABS: 10.0000 mg | ORAL_TABLET | Freq: Every day | ORAL | 2 refills | Status: DC

## 2016-10-09 MED ORDER — PANTOPRAZOLE SODIUM 20 MG PO TBEC: 20.0000 mg | DELAYED_RELEASE_TABLET | Freq: Every day | ORAL | 1 refills | Status: DC

## 2016-10-10 ENCOUNTER — Encounter: Payer: Self-pay | Admitting: Neurology

## 2016-10-16 ENCOUNTER — Ambulatory Visit: Payer: PPO | Admitting: Neurology

## 2016-10-26 ENCOUNTER — Ambulatory Visit: Payer: PPO | Admitting: Neurology

## 2016-10-31 DIAGNOSIS — H5712 Ocular pain, left eye: Secondary | ICD-10-CM | POA: Diagnosis not present

## 2016-10-31 DIAGNOSIS — T1502XA Foreign body in cornea, left eye, initial encounter: Secondary | ICD-10-CM | POA: Diagnosis not present

## 2016-11-03 DIAGNOSIS — T1502XA Foreign body in cornea, left eye, initial encounter: Secondary | ICD-10-CM | POA: Diagnosis not present

## 2016-11-14 NOTE — Progress Notes (Signed)
HPI:   Latoya Thompson is a 56 y.o. female, who is here today to follow on HTN.  Last seen on 07/25/16  for hospital follow-up.  Hypertension:    Currently on Enalapril 20 mg and Verapamil 120 mg daily.  Home BP readings: 120's/80's. No side effects reported. Last eye exam earlier this year, Hx of left eye keratoconus, blind.  She is following low salt diet.  She has not noted unusual headache, visual changes, exertional chest pain, dyspnea,  focal weakness, or edema.   Lab Results  Component Value Date   CREATININE 0.98 07/13/2016   BUN 12 07/13/2016   NA 141 07/13/2016   K 3.9 07/13/2016   CL 107 07/13/2016   CO2 27 07/13/2016    She has an appt with neuro because vertigo, which has resolved.   She does not have concerns today.  Hx of NASH. She denies abdominal pain, nausea,or vomiting.  05/2016 transaminases normal, still mildly elevated alk phosphatase. Hep B and A vaccination was recommended, she has not had neither one. She had acute hep work up in 2016 but I could not find HBVsAb. She is not sure about prior vaccination.  HLD: She is on Mevacor 20 mg daily 04/2016 TC 216,LDL 155, HDL 45,and TG 78.  She is following low fat diet. Tolerating medication well, no side effects reported.  She has been exercising regularly (3 times per week walking treadmill) and eating healthier. She has noted wt loss and feeling more energetic.   Review of Systems  Constitutional: Negative for activity change, appetite change, fatigue and fever.  HENT: Negative for mouth sores, nosebleeds, sore throat and trouble swallowing.   Eyes: Negative for redness and visual disturbance.  Respiratory: Negative for cough, shortness of breath and wheezing.   Cardiovascular: Negative for chest pain, palpitations and leg swelling.  Gastrointestinal: Negative for abdominal pain, nausea and vomiting.       Negative for changes in bowel habits.  Endocrine: Negative for cold  intolerance, heat intolerance, polydipsia, polyphagia and polyuria.  Genitourinary: Negative for decreased urine volume and hematuria.  Musculoskeletal: Negative for gait problem and myalgias.  Neurological: Negative for dizziness, syncope, weakness and headaches.  Psychiatric/Behavioral: Negative for confusion. The patient is not nervous/anxious.       Current Outpatient Prescriptions on File Prior to Visit  Medication Sig Dispense Refill  . aspirin EC 81 MG tablet Take 81 mg by mouth daily.    . montelukast (SINGULAIR) 10 MG tablet Take 1 tablet (10 mg total) by mouth at bedtime. 90 tablet 2  . pantoprazole (PROTONIX) 20 MG tablet Take 1 tablet (20 mg total) by mouth daily. 180 tablet 1  . prednisoLONE acetate (PRED FORTE) 1 % ophthalmic suspension Place 1 drop into the left eye daily.     . vitamin C (ASCORBIC ACID) 500 MG tablet Take 500 mg by mouth daily.    . vitamin E 400 UNIT capsule Take 400 Units by mouth daily.     No current facility-administered medications on file prior to visit.      Past Medical History:  Diagnosis Date  . Chicken pox   . Hypercholesteremia   . Hypertension   . Keratoconus    Left   Allergies  Allergen Reactions  . Codeine Itching    Social History   Social History  . Marital status: Married    Spouse name: N/A  . Number of children: N/A  . Years of education: N/A  Social History Main Topics  . Smoking status: Never Smoker  . Smokeless tobacco: Never Used  . Alcohol use No  . Drug use: No  . Sexual activity: Not Asked   Other Topics Concern  . None   Social History Narrative  . None    Vitals:   11/15/16 1503  BP: 118/80  Pulse: 87  Resp: 12  SpO2: 97%   Body mass index is 29.58 kg/m.  Wt Readings from Last 3 Encounters:  11/15/16 167 lb (75.8 kg)  07/25/16 186 lb 4 oz (84.5 kg)  05/18/16 186 lb 2 oz (84.4 kg)     Physical Exam  Nursing note and vitals reviewed. Constitutional: She is oriented to person,  place, and time. She appears well-developed. No distress.  HENT:  Head: Normocephalic and atraumatic.  Mouth/Throat: Oropharynx is clear and moist and mucous membranes are normal.  Eyes: Conjunctivae are normal. Left pupil is not round and not reactive. Pupils are unequal.  Cardiovascular: Normal rate and regular rhythm.   No murmur heard. Pulses:      Dorsalis pedis pulses are 2+ on the right side, and 2+ on the left side.  Respiratory: Effort normal and breath sounds normal. No respiratory distress.  GI: Soft. She exhibits no mass. There is no hepatomegaly. There is no tenderness.  Musculoskeletal: She exhibits no edema or tenderness.  Lymphadenopathy:    She has no cervical adenopathy.  Neurological: She is alert and oriented to person, place, and time. She has normal strength. Coordination and gait normal.  Skin: Skin is warm. No erythema.  Psychiatric: She has a normal mood and affect.  Well groomed, good eye contact.    ASSESSMENT AND PLAN:   Latoya Thompson was seen today for follow-up.  Diagnoses and all orders for this visit:  Lab Results  Component Value Date   ALT 16 11/15/2016   AST 22 11/15/2016   ALKPHOS 164 (H) 11/15/2016   BILITOT 0.5 11/15/2016   Lab Results  Component Value Date   CREATININE 0.99 11/15/2016   BUN 14 11/15/2016   NA 141 11/15/2016   K 4.0 11/15/2016   CL 107 11/15/2016   CO2 26 11/15/2016    Essential hypertension  Adequately controlled. No changes in current management. DASH-low salt diet recommended. Eye exam current. F/U in 6 months, before if needed.  -     Comprehensive metabolic panel -     enalapril (VASOTEC) 20 MG tablet; Take 1 tablet (20 mg total) by mouth daily. -     verapamil (CALAN) 120 MG tablet; Take 1 tablet (120 mg total) by mouth daily.  Overweight with body mass index (BMI) 25.0-29.9  Prior BMI 32, lost about 9 lb sine 07/2016. We discussed benefits of wt loss as well as adverse effects of obesity. Consistency  with healthy diet and physical activity encouraged.   NASH (nonalcoholic steatohepatitis)  Further recommendations will be given according to lab results.  -     Comprehensive metabolic panel -     Cancel: Hepatitis B surface antibody -     Hep B Surface Antibody  Hypercholesteremia  Will plan in checking FLP next OV, she is not fasting today. No changes in Lovastatin dose. Continue low fat diet.  -     lovastatin (MEVACOR) 20 MG tablet; Take 1 tablet (20 mg total) by mouth daily.    Vertigo has resolved, still recommend keeping neuro appt on 12/06/16.   -Latoya. ARDYS HATAWAY was advised to return sooner  than planned today if new concerns arise.       Amarisa Wilinski G. Martinique, MD  [facility]. [place] office.

## 2016-11-15 ENCOUNTER — Ambulatory Visit (INDEPENDENT_AMBULATORY_CARE_PROVIDER_SITE_OTHER): Payer: BLUE CROSS/BLUE SHIELD | Admitting: Family Medicine

## 2016-11-15 ENCOUNTER — Ambulatory Visit: Payer: PPO | Admitting: Family Medicine

## 2016-11-15 ENCOUNTER — Encounter: Payer: Self-pay | Admitting: Family Medicine

## 2016-11-15 VITALS — BP 118/80 | HR 87 | Resp 12 | Ht 63.0 in | Wt 167.0 lb

## 2016-11-15 DIAGNOSIS — E663 Overweight: Secondary | ICD-10-CM | POA: Diagnosis not present

## 2016-11-15 DIAGNOSIS — I10 Essential (primary) hypertension: Secondary | ICD-10-CM | POA: Diagnosis not present

## 2016-11-15 DIAGNOSIS — K7581 Nonalcoholic steatohepatitis (NASH): Secondary | ICD-10-CM | POA: Diagnosis not present

## 2016-11-15 DIAGNOSIS — E7800 Pure hypercholesterolemia, unspecified: Secondary | ICD-10-CM | POA: Diagnosis not present

## 2016-11-15 MED ORDER — LOVASTATIN 20 MG PO TABS: 20.0000 mg | ORAL_TABLET | Freq: Every day | ORAL | 3 refills | Status: DC

## 2016-11-15 MED ORDER — ENALAPRIL MALEATE 20 MG PO TABS: 20.0000 mg | ORAL_TABLET | Freq: Every day | ORAL | 2 refills | Status: DC

## 2016-11-15 MED ORDER — VERAPAMIL HCL 120 MG PO TABS: 120.0000 mg | ORAL_TABLET | Freq: Every day | ORAL | 2 refills | Status: DC

## 2016-11-15 NOTE — Patient Instructions (Signed)
A few things to remember from today's visit:   Essential hypertension - Plan: Comprehensive metabolic panel  BMI 93.2-41.9,RVACQ  NASH (nonalcoholic steatohepatitis) - Plan: Comprehensive metabolic panel, Hepatitis B surface antibody   Please be sure medication list is accurate. If a new problem present, please set up appointment sooner than planned today.

## 2016-11-16 LAB — COMPREHENSIVE METABOLIC PANEL
ALK PHOS: 164 U/L — AB (ref 39–117)
ALT: 16 U/L (ref 0–35)
AST: 22 U/L (ref 0–37)
Albumin: 4.2 g/dL (ref 3.5–5.2)
BILIRUBIN TOTAL: 0.5 mg/dL (ref 0.2–1.2)
BUN: 14 mg/dL (ref 6–23)
CALCIUM: 9.7 mg/dL (ref 8.4–10.5)
CO2: 26 mEq/L (ref 19–32)
Chloride: 107 mEq/L (ref 96–112)
Creatinine, Ser: 0.99 mg/dL (ref 0.40–1.20)
GFR: 74.49 mL/min (ref >60.00)
GLUCOSE: 93 mg/dL (ref 70–99)
POTASSIUM: 4 meq/L (ref 3.5–5.1)
Sodium: 141 mEq/L (ref 135–145)
TOTAL PROTEIN: 7.1 g/dL (ref 6.0–8.3)

## 2016-11-16 LAB — HEPATITIS B SURFACE ANTIBODY,QUALITATIVE: Hep B S Ab: BORDERLINE — AB

## 2016-11-20 ENCOUNTER — Ambulatory Visit: Payer: PPO | Admitting: Family Medicine

## 2016-11-29 ENCOUNTER — Ambulatory Visit (INDEPENDENT_AMBULATORY_CARE_PROVIDER_SITE_OTHER): Payer: BLUE CROSS/BLUE SHIELD | Admitting: *Deleted

## 2016-11-29 DIAGNOSIS — Z111 Encounter for screening for respiratory tuberculosis: Secondary | ICD-10-CM

## 2016-11-29 DIAGNOSIS — Z23 Encounter for immunization: Secondary | ICD-10-CM | POA: Diagnosis not present

## 2016-11-29 NOTE — Patient Instructions (Signed)
Patient was advised to return to office Friday 12/01/2016 to have PPD read.  Patient also has form given to Dr. Martinique, she will pick up Friday as well.

## 2016-12-01 ENCOUNTER — Ambulatory Visit: Payer: BLUE CROSS/BLUE SHIELD

## 2016-12-01 LAB — TB SKIN TEST
INDURATION: 0 mm
TB Skin Test: NEGATIVE

## 2016-12-06 ENCOUNTER — Ambulatory Visit: Payer: PPO | Admitting: Neurology

## 2017-03-12 ENCOUNTER — Other Ambulatory Visit: Payer: Self-pay | Admitting: *Deleted

## 2017-03-12 DIAGNOSIS — I10 Essential (primary) hypertension: Secondary | ICD-10-CM

## 2017-03-12 MED ORDER — VERAPAMIL HCL 120 MG PO TABS: 120.0000 mg | ORAL_TABLET | Freq: Every day | ORAL | 2 refills | Status: DC

## 2017-03-14 ENCOUNTER — Ambulatory Visit (INDEPENDENT_AMBULATORY_CARE_PROVIDER_SITE_OTHER): Payer: PPO | Admitting: Neurology

## 2017-03-14 ENCOUNTER — Encounter: Payer: Self-pay | Admitting: Neurology

## 2017-03-14 VITALS — Ht 63.0 in | Wt 156.2 lb

## 2017-03-14 DIAGNOSIS — H81399 Other peripheral vertigo, unspecified ear: Secondary | ICD-10-CM

## 2017-03-14 NOTE — Patient Instructions (Signed)
I think the vertigo was peripheral, either crystals in the inner ear or symptoms of the nerve in the ear due to the [place].  I do not think it was a stroke or anything involving the brain.  No further workup is needed.  Follow up as needed.

## 2017-03-14 NOTE — Progress Notes (Signed)
NEUROLOGY CONSULTATION NOTE  Latoya Thompson MRN: [id number] DOB: 18-Sep-1960  Referring provider: Dr. Martinique Primary care provider: Dr. Martinique  Reason for consult:  dizziness  HISTORY OF PRESENT ILLNESS: Latoya Thompson is a 57 year old female with hypertension, hyperlipidemia, blindness in left eye, left bundle branch block and history of Ramsay-Hunt syndrome who presents for dizziness.  History supplemented by ED and PCP notes.  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  Soon afterward, she developed dizziness, described as spinning sensation.  It was severe, lasting just a couple of minutes.  It was not associated with nausea, vomiting, double vision, slurred speech, tinnitus, hearing loss or hyperacusis or unilateral numbness or weakness.  She would feel unsteady on her feet.  It would occur spontaneously while just standing but it was not positional.  It occurred twice a week for about 3 months and then resolved.  She hasn't had a spell in about 6 months.  She took meclizine for it, which helped somewhat with symptoms.  11/15/16 CMP:  Na 141, K 4, Cl 107, CO2 26, glucose 93, BUN 14, Cr 0.99, t bili 0.5, ALP 164, AST 22, ALT 16.  PAST MEDICAL HISTORY: Past Medical History:  Diagnosis Date  . Chicken pox   . Hypercholesteremia   . Hypertension   . Keratoconus    Left    PAST SURGICAL HISTORY: Past Surgical History:  Procedure Laterality Date  . BREAST SURGERY     biopsy  . CORNEAL TRANSPLANT      MEDICATIONS: Current Outpatient Medications on File Prior to Visit  Medication Sig Dispense Refill  . aspirin EC 81 MG tablet Take 81 mg by mouth daily.    . enalapril (VASOTEC) 20 MG tablet Take 1 tablet (20 mg total) by mouth daily. 90 tablet 2  . lovastatin (MEVACOR) 20 MG tablet Take 1 tablet (20 mg total) by mouth daily. 90 tablet 3  . montelukast (SINGULAIR) 10 MG tablet Take 1 tablet (10 mg total)  by mouth at bedtime. 90 tablet 2  . pantoprazole (PROTONIX) 20 MG tablet Take 1 tablet (20 mg total) by mouth daily. 180 tablet 1  . prednisoLONE acetate (PRED FORTE) 1 % ophthalmic suspension Place 1 drop into the left eye daily.     . verapamil (CALAN) 120 MG tablet Take 1 tablet (120 mg total) by mouth daily. 90 tablet 2  . vitamin C (ASCORBIC ACID) 500 MG tablet Take 500 mg by mouth daily.    . vitamin E 400 UNIT capsule Take 400 Units by mouth daily.     No current facility-administered medications on file prior to visit.     ALLERGIES: Allergies  Allergen Reactions  . Codeine Itching    FAMILY HISTORY: Family History  Problem Relation Age of Onset  . Hyperlipidemia Mother   . Heart disease Mother   . Stroke Mother   . Hypertension Mother   . Diabetes Mother   . Hyperlipidemia Father   . Heart disease Father   . Stroke Father   . Hypertension Father   . Diabetes Father     SOCIAL HISTORY: Social History   Socioeconomic History  . Marital status: Married    Spouse name: Eddie Dibbles  . Number of children: 0  . Years of education: Not on file  . Highest education level: Bachelor's degree (e.g., BA, AB, BS)  Social Needs  . Financial resource strain: Not on file  .  Food insecurity - worry: Not on file  . Food insecurity - inability: Not on file  . Transportation needs - medical: Not on file  . Transportation needs - non-medical: Not on file  Occupational History  . Occupation: Educator  Tobacco Use  . Smoking status: Never Smoker  . Smokeless tobacco: Never Used  Substance and Sexual Activity  . Alcohol use: No  . Drug use: No  . Sexual activity: Not on file  Other Topics Concern  . Not on file  Social History Narrative   Married, lives with husband Eddie Dibbles in a 2 story home. Drinks 1/2 cup of coffee a day, rare soda or tea. Exercise regularly for at least 60 minutes.    REVIEW OF SYSTEMS: Constitutional: No fevers, chills, or sweats, no generalized fatigue,  change in appetite Eyes: No visual changes, double vision, eye pain Ear, nose and throat: No hearing loss, ear pain, nasal congestion, sore throat Cardiovascular: No chest pain, palpitations Respiratory:  No shortness of breath at rest or with exertion, wheezes GastrointestinaI: No nausea, vomiting, diarrhea, abdominal pain, fecal incontinence Genitourinary:  No dysuria, urinary retention or frequency Musculoskeletal:  No neck pain, back pain Integumentary: No rash, pruritus, skin lesions Neurological: as above Psychiatric: No depression, insomnia, anxiety Endocrine: No palpitations, fatigue, diaphoresis, mood swings, change in appetite, change in weight, increased thirst Hematologic/Lymphatic:  No purpura, petechiae. Allergic/Immunologic: no itchy/runny eyes, nasal congestion, recent allergic reactions, rashes  PHYSICAL EXAM: Vitals:   03/14/17 0809  SpO2: 98%   General: No acute distress.  Patient appears well-groomed.  Head:  Normocephalic/atraumatic Eyes:  fundi examined but not visualized Neck: supple, no paraspinal tenderness, full range of motion Back: No paraspinal tenderness Heart: regular rate and rhythm Lungs: Clear to auscultation bilaterally. Vascular: No carotid bruits. Neurological Exam: Mental status: alert and oriented to person, place, and time, recent and remote memory intact, fund of knowledge intact, attention and concentration intact, speech fluent and not dysarthric, language intact. Cranial nerves: CN I: not tested CN II: pupils equal, round and reactive to light, blind in left eye CN III, IV, VI:  full range of motion, no nystagmus, no ptosis CN V: facial sensation intact CN VII: Widened right palpebral fissure CN VIII: hearing intact CN IX, X: gag intact, uvula midline CN XI: sternocleidomastoid and trapezius muscles intact CN XII: tongue midline Bulk & Tone: normal, no fasciculations. Motor:  5/5 throughout Sensation: temperature and vibration  sensation intact. Deep Tendon Reflexes:  2+ throughout, toes downgoing.  Finger to nose testing:  Bilateral intention tremor.  Without dysmetria. Heel to shin:  Without dysmetria.  Gait:  Normal station and stride.  Able to turn and tandem walk. Romberg negative.  IMPRESSION: Right sided peripheral vertigo, likely sequelae of Ramsay Hunt syndrome, since resolved.   PLAN: No further workup warranted.  Thank you for allowing me to take part in the care of this patient.  Metta Clines, DO  CC:  Betty Martinique, MD

## 2017-03-16 ENCOUNTER — Ambulatory Visit: Payer: BLUE CROSS/BLUE SHIELD | Admitting: Neurology

## 2017-03-26 IMAGING — CR DG CHEST 2V
2 series · 2 of 2 positions shown · non-contrast
Comparison: None.

CLINICAL DATA: 55-year-old female with chest pain

EXAM:
CHEST  2 VIEW

[w chest pa]
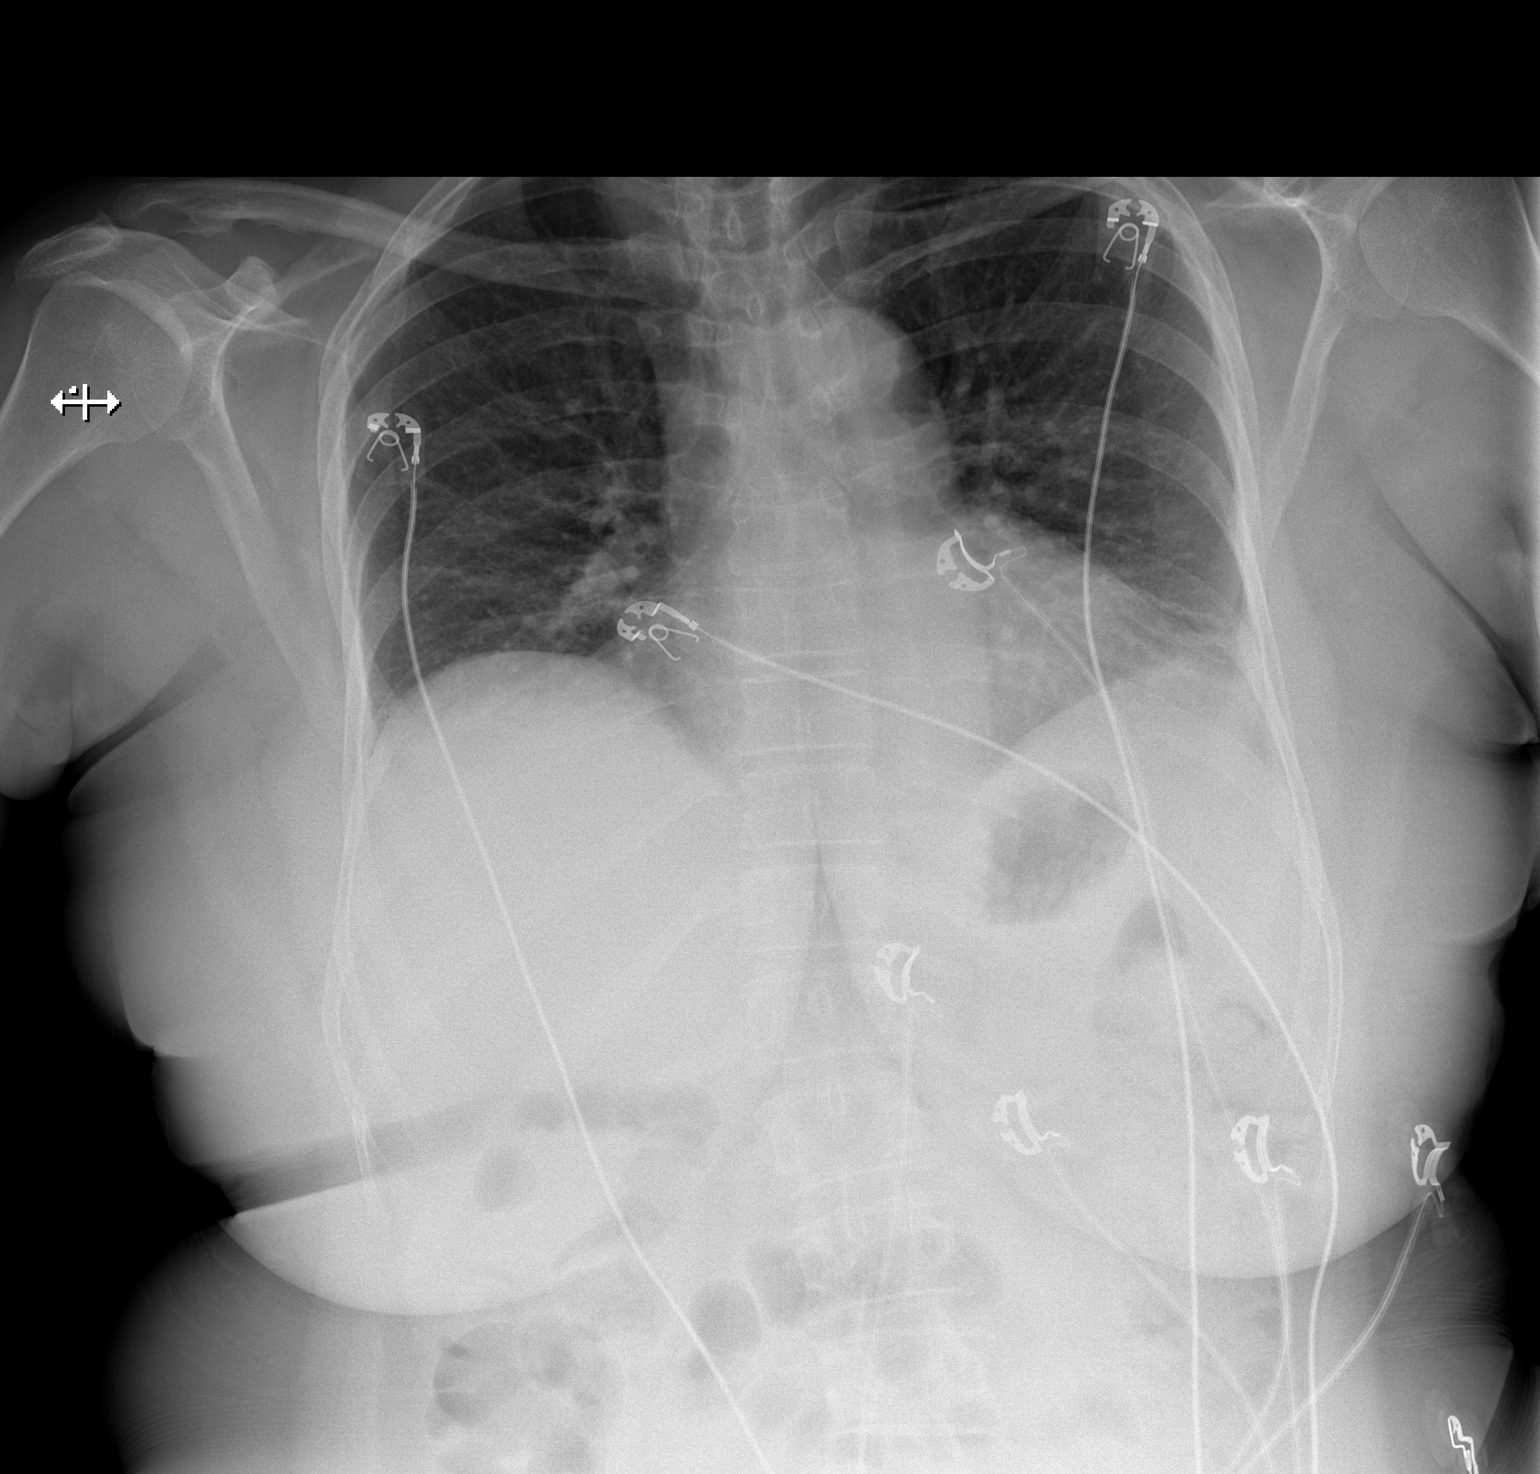

[w chest lat]
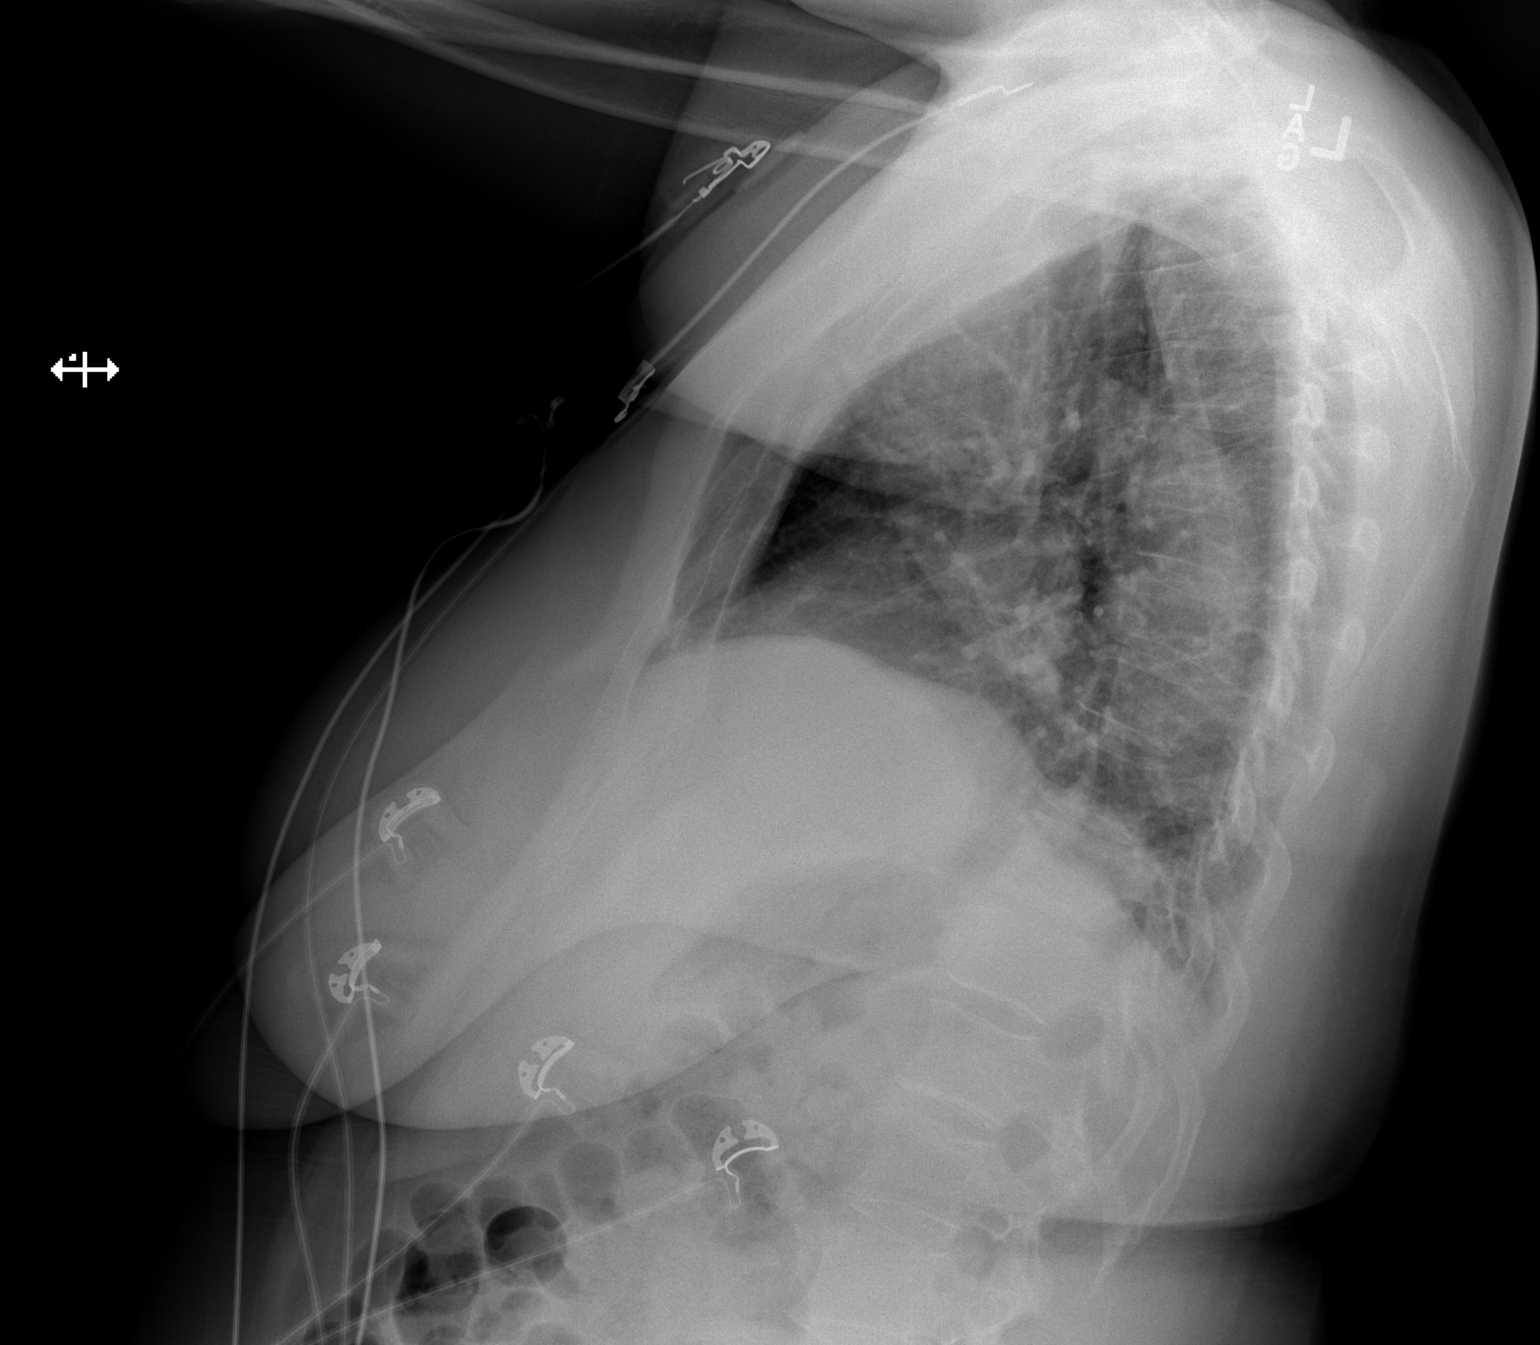

[2 of 2 positions shown; findings below may reference images not displayed]

FINDINGS: Two views of the chest demonstrate minimal bibasilar atelectatic
changes of the lungs. There is no focal consolidation, pleural
effusion, or pneumothorax. The cardiac silhouette is within normal
limits. No acute osseous pathology.
IMPRESSION: No active cardiopulmonary disease.

## 2017-04-04 ENCOUNTER — Encounter: Payer: Self-pay | Admitting: Family Medicine

## 2017-04-04 ENCOUNTER — Ambulatory Visit (INDEPENDENT_AMBULATORY_CARE_PROVIDER_SITE_OTHER): Payer: PPO | Admitting: Family Medicine

## 2017-04-04 VITALS — BP 120/76 | HR 76 | Temp 98.0°F | Resp 12 | Wt 157.4 lb

## 2017-04-04 DIAGNOSIS — M25561 Pain in right knee: Secondary | ICD-10-CM

## 2017-04-04 NOTE — Progress Notes (Signed)
ACUTE VISIT   HPI:  Chief Complaint  Patient presents with  . Right knee pain    has been going on for a while    [name] is a 57 y.o. female, who is here today complaining of 6 months of intermittent right knee pain, medial aspect. Pain is exacerbated by running on the treadmill and alleviated by rest.  She does not have any pain if she walks, going up or down stairs, or with knee ROM. Sometimes mild pain with palpation of area.  Achy pain, 5/10. She started going to the gym about 6 months ago, around the time pain started.  She denies any injury. She has no noted local edema or erythema and she has no limitation of range of motion. No prior history of knee pain and no pain of other joints.  She has not taking any OTC medication.   Review of Systems  Constitutional: Negative for chills, fatigue and fever.  Cardiovascular: Negative for leg swelling.  Musculoskeletal: Positive for arthralgias. Negative for back pain, gait problem and joint swelling.  Skin: Negative for rash and wound.  Neurological: Negative for weakness and numbness.      Current Outpatient Medications on File Prior to Visit  Medication Sig Dispense Refill  . aspirin EC 81 MG tablet Take 81 mg by mouth daily.    . enalapril (VASOTEC) 20 MG tablet Take 1 tablet (20 mg total) by mouth daily. 90 tablet 2  . lovastatin (MEVACOR) 20 MG tablet Take 1 tablet (20 mg total) by mouth daily. 90 tablet 3  . montelukast (SINGULAIR) 10 MG tablet Take 1 tablet (10 mg total) by mouth at bedtime. 90 tablet 2  . pantoprazole (PROTONIX) 20 MG tablet Take 1 tablet (20 mg total) by mouth daily. 180 tablet 1  . prednisoLONE acetate (PRED FORTE) 1 % ophthalmic suspension Place 1 drop into the left eye daily.     . verapamil (CALAN) 120 MG tablet Take 1 tablet (120 mg total) by mouth daily. 90 tablet 2  . vitamin C (ASCORBIC ACID) 500 MG tablet Take 500 mg by mouth daily.    . vitamin E 400 UNIT capsule Take  400 Units by mouth daily.     No current facility-administered medications on file prior to visit.      Past Medical History:  Diagnosis Date  . Chicken pox   . Hypercholesteremia   . Hypertension   . Keratoconus    Left   Allergies  Allergen Reactions  . Codeine Itching    Social History   Socioeconomic History  . Marital status: Married    Spouse name: Latoya Thompson  . Number of children: 0  . Years of education: None  . Highest education level: Bachelor's degree (e.g., BA, AB, BS)  Social Needs  . Financial resource strain: None  . Food insecurity - worry: None  . Food insecurity - inability: None  . Transportation needs - medical: None  . Transportation needs - non-medical: None  Occupational History  . Occupation: Educator  Tobacco Use  . Smoking status: Never Smoker  . Smokeless tobacco: Never Used  Substance and Sexual Activity  . Alcohol use: No  . Drug use: No  . Sexual activity: None  Other Topics Concern  . None  Social History Narrative   Married, lives with husband Latoya Thompson in a 2 story home. Drinks 1/2 cup of coffee a day, rare soda or tea. Exercise regularly for at least 60  minutes.    Vitals:   04/04/17 1625  BP: 120/76  Pulse: 76  Resp: 12  Temp: 98 F (36.7 C)  SpO2: 97%   Body mass index is 27.88 kg/m.   Physical Exam  Nursing note and vitals reviewed. Constitutional: She is oriented to person, place, and time. She appears well-developed and well-nourished. She does not appear ill. No distress.  HENT:  Head: Normocephalic and atraumatic.  Cardiovascular: Normal rate and regular rhythm.  Pulses:      Dorsalis pedis pulses are 2+ on the right side.       Posterior tibial pulses are 2+ on the right side.     Respiratory: Effort normal. No respiratory distress.  Musculoskeletal: She exhibits no edema.       Right lower leg: She exhibits no tenderness and no edema.  Right knee: on inspection no effusion, erythema, or deformities. Valgus and  varus stress normal, anterior and posterior drawer test negative. Patellar apprehension test negative. Mild tenderness upon palpation of medial interarticular line. Normal ROM, no pain elicited. Mild crepitus with movement.   Neurological: She is alert and oriented to person, place, and time. She has normal strength. Gait normal.  Skin: Skin is warm. No rash noted. No erythema.  Psychiatric: She has a normal mood and affect.  Well groomed, good eye contact.    ASSESSMENT AND PLAN:  [name] was seen today for right knee pain.  Diagnoses and all orders for this visit:  Right knee pain, unspecified chronicity   We discussed possible etiologies, including knee sprain on OA. She just has pain when she runs the treadmill, she does not have any issue walking the treadmill.So I recommend avoiding identify trigger factor. She can use OTC topical icy hot or Aspercreme. Since there is no history of trauma I do not think imaging is needed today. Follow-up as needed.   -[name] was advised to seek immediate medical attention if sudden worsening symptoms or to follow if they persist or if new concerns arise.       Latoya Thompson G. Martinique, MD  [facility]. [place] office.

## 2017-04-04 NOTE — Patient Instructions (Signed)
A few things to remember from today's visit:   Right knee pain, unspecified chronicity  Avoid trigger factors. Walk instead running.  Please be sure medication list is accurate. If a new problem present, please set up appointment sooner than planned today.

## 2017-05-15 ENCOUNTER — Ambulatory Visit: Payer: BLUE CROSS/BLUE SHIELD | Admitting: Family Medicine

## 2017-05-23 DIAGNOSIS — T1502XS Foreign body in cornea, left eye, sequela: Secondary | ICD-10-CM | POA: Diagnosis not present

## 2017-06-28 DIAGNOSIS — H52211 Irregular astigmatism, right eye: Secondary | ICD-10-CM | POA: Diagnosis not present

## 2017-06-28 DIAGNOSIS — H18603 Keratoconus, unspecified, bilateral: Secondary | ICD-10-CM | POA: Diagnosis not present

## 2017-06-28 DIAGNOSIS — Z9889 Other specified postprocedural states: Secondary | ICD-10-CM | POA: Diagnosis not present

## 2017-07-02 NOTE — Progress Notes (Signed)
HPI:   [name] is a 57 y.o. female, who is here today for 6 months follow up.   She was last seen on 04/04/17 for right knee pain.  She is reporting today that her knee pain has resolved.  Hypertension:   Currently on Enalapril 20 mg daily and Verapamil 120 mg daily.  BP at home 120's/70's.  Last eye exam 06/28/17.  She is taking medications as instructed, no side effects reported.  She has not noted unusual headache, visual changes, exertional chest pain, dyspnea,  focal weakness, or edema.   Lab Results  Component Value Date   CREATININE 0.99 11/15/2016   BUN 14 11/15/2016   NA 141 11/15/2016   K 4.0 11/15/2016   CL 107 11/15/2016   CO2 26 11/15/2016     Hyperlipidemia:  Currently on Lovastatin 20 mg daily.  Following a low fat diet: Yes.  She has not noted side effects with medication.      Elevated alk phosphatase: History of Latoya Thompson (nonalcoholic steatohepatitis), she was following with GI, hepatitis work-up negative.  She completed hepatitis B and a vaccination. Currently she is on OTC vitamin D.   Lab Results  Component Value Date   ALT 16 11/15/2016   AST 22 11/15/2016   ALKPHOS 164 (H) 11/15/2016   BILITOT 0.5 11/15/2016     Review of Systems  Constitutional: Negative for activity change, appetite change, fatigue and fever.  HENT: Negative for mouth sores, nosebleeds and trouble swallowing.   Eyes: Negative for redness and visual disturbance.  Respiratory: Negative for cough, shortness of breath and wheezing.   Cardiovascular: Negative for chest pain, palpitations and leg swelling.  Gastrointestinal: Negative for abdominal pain, nausea and vomiting.       Negative for changes in bowel habits.  Genitourinary: Negative for decreased urine volume and hematuria.  Musculoskeletal: Negative for gait problem and myalgias.  Skin: Negative for rash.  Neurological: Negative for syncope, weakness and headaches.     Current Outpatient  Medications on File Prior to Visit  Medication Sig Dispense Refill  . aspirin EC 81 MG tablet Take 81 mg by mouth daily.    . enalapril (VASOTEC) 20 MG tablet Take 1 tablet (20 mg total) by mouth daily. 90 tablet 2  . lovastatin (MEVACOR) 20 MG tablet Take 1 tablet (20 mg total) by mouth daily. 90 tablet 3  . montelukast (SINGULAIR) 10 MG tablet Take 1 tablet (10 mg total) by mouth at bedtime. 90 tablet 2  . pantoprazole (PROTONIX) 20 MG tablet Take 1 tablet (20 mg total) by mouth daily. 180 tablet 1  . prednisoLONE acetate (PRED FORTE) 1 % ophthalmic suspension Place 1 drop into the left eye daily.     . verapamil (CALAN) 120 MG tablet Take 1 tablet (120 mg total) by mouth daily. 90 tablet 2  . vitamin C (ASCORBIC ACID) 500 MG tablet Take 500 mg by mouth daily.    . vitamin E 400 UNIT capsule Take 400 Units by mouth daily.     No current facility-administered medications on file prior to visit.      Past Medical History:  Diagnosis Date  . Chicken pox   . Hypercholesteremia   . Hypertension   . Keratoconus    Left   Allergies  Allergen Reactions  . Codeine Itching    Social History   Socioeconomic History  . Marital status: Married    Spouse name: Eddie Dibbles  . Number of children:  0  . Years of education: Not on file  . Highest education level: Bachelor's degree (e.g., BA, AB, BS)  Occupational History  . Occupation: Tourist information centre manager  Social Needs  . Financial resource strain: Not on file  . Food insecurity:    Worry: Not on file    Inability: Not on file  . Transportation needs:    Medical: Not on file    Non-medical: Not on file  Tobacco Use  . Smoking status: Never Smoker  . Smokeless tobacco: Never Used  Substance and Sexual Activity  . Alcohol use: No  . Drug use: No  . Sexual activity: Not on file  Lifestyle  . Physical activity:    Days per week: Not on file    Minutes per session: Not on file  . Stress: Not on file  Relationships  . Social connections:    Talks  on phone: Not on file    Gets together: Not on file    Attends religious service: Not on file    Active member of club or organization: Not on file    Attends meetings of clubs or organizations: Not on file    Relationship status: Not on file  Other Topics Concern  . Not on file  Social History Narrative   Married, lives with husband Eddie Dibbles in a 2 story home. Drinks 1/2 cup of coffee a day, rare soda or tea. Exercise regularly for at least 60 minutes.    Vitals:   07/03/17 1032  BP: 124/76  Pulse: 81  Temp: 98.5 F (36.9 C)  SpO2: 97%   Body mass index is 27.3 kg/m.   Physical Exam  Nursing note reviewed. Constitutional: She is oriented to person, place, and time. She appears well-developed. No distress.  HENT:  Head: Normocephalic and atraumatic.  Mouth/Throat: Oropharynx is clear and moist and mucous membranes are normal.  Eyes: Conjunctivae are normal. Left pupil is not round and not reactive. Pupils are unequal.  Cardiovascular: Normal rate and regular rhythm.  No murmur heard. Pulses:      Dorsalis pedis pulses are 2+ on the right side, and 2+ on the left side.  Respiratory: Effort normal and breath sounds normal. No respiratory distress.  GI: Soft. She exhibits no mass. There is no hepatomegaly. There is no tenderness.  Musculoskeletal: She exhibits no edema.  Lymphadenopathy:    She has no cervical adenopathy.  Neurological: She is alert and oriented to person, place, and time. She has normal strength. Gait normal.  Skin: Skin is warm. No erythema.  Psychiatric: She has a normal mood and affect.  Well groomed, good eye contact.      ASSESSMENT AND PLAN:   [name] was seen today for 6 months follow-up.  Orders Placed This Encounter  Procedures  . Comprehensive metabolic panel  . Lipid panel    Hypertension Adequately controlled. No changes in current management. DASH-low salt diet to continue. Eye exam is current. F/U in 6 months, before  if needed.   Hypercholesteremia No changes in current management, will follow labs done today and will give further recommendations accordingly. F/U in 6-12 months.   NASH (nonalcoholic steatohepatitis) Continue Vit E OTC and Lovastatin. Hep vaccination completed. Further recommendations will be given according to labs results.    Elevated alkaline phosphatase level  I do not see a GGT done in the past but given her history of Latoya Thompson, I assume this is related to liver disease. We will continue following.    -  [name] was advised to return sooner than planned today if new concerns arise.       Amara Justen G. Martinique, MD  [facility]. [place] office.

## 2017-07-03 ENCOUNTER — Ambulatory Visit: Payer: PPO | Admitting: Family Medicine

## 2017-07-03 ENCOUNTER — Ambulatory Visit (INDEPENDENT_AMBULATORY_CARE_PROVIDER_SITE_OTHER): Payer: PPO | Admitting: Family Medicine

## 2017-07-03 ENCOUNTER — Encounter: Payer: Self-pay | Admitting: Family Medicine

## 2017-07-03 VITALS — BP 124/76 | HR 81 | Temp 98.5°F | Resp 12 | Ht 63.0 in | Wt 154.1 lb

## 2017-07-03 DIAGNOSIS — E7800 Pure hypercholesterolemia, unspecified: Secondary | ICD-10-CM | POA: Diagnosis not present

## 2017-07-03 DIAGNOSIS — I10 Essential (primary) hypertension: Secondary | ICD-10-CM | POA: Diagnosis not present

## 2017-07-03 DIAGNOSIS — R748 Abnormal levels of other serum enzymes: Secondary | ICD-10-CM | POA: Diagnosis not present

## 2017-07-03 DIAGNOSIS — K7581 Nonalcoholic steatohepatitis (NASH): Secondary | ICD-10-CM | POA: Diagnosis not present

## 2017-07-03 LAB — COMPREHENSIVE METABOLIC PANEL
ALK PHOS: 168 U/L — AB (ref 39–117)
ALT: 18 U/L (ref 0–35)
AST: 24 U/L (ref 0–37)
Albumin: 4.2 g/dL (ref 3.5–5.2)
BUN: 13 mg/dL (ref 6–23)
CO2: 30 meq/L (ref 19–32)
Calcium: 9.6 mg/dL (ref 8.4–10.5)
Chloride: 105 mEq/L (ref 96–112)
Creatinine, Ser: 0.93 mg/dL (ref 0.40–1.20)
GFR: 79.89 mL/min (ref >60.00)
GLUCOSE: 74 mg/dL (ref 70–99)
Potassium: 4.1 mEq/L (ref 3.5–5.1)
SODIUM: 140 meq/L (ref 135–145)
TOTAL PROTEIN: 7.4 g/dL (ref 6.0–8.3)
Total Bilirubin: 0.7 mg/dL (ref 0.2–1.2)

## 2017-07-03 LAB — LIPID PANEL
CHOL/HDL RATIO: 4
Cholesterol: 207 mg/dL — ABNORMAL HIGH (ref 0–200)
HDL: 51.1 mg/dL (ref >39.00)
LDL CALC: 137 mg/dL — AB (ref 0–99)
NONHDL: 155.98
Triglycerides: 96 mg/dL (ref 0.0–149.0)
VLDL: 19.2 mg/dL (ref 0.0–40.0)

## 2017-07-03 NOTE — Assessment & Plan Note (Signed)
Continue Vit E OTC and Lovastatin. Hep vaccination completed. Further recommendations will be given according to labs results.

## 2017-07-03 NOTE — Patient Instructions (Signed)
A few things to remember from today's visit:   Essential hypertension - Plan: Comprehensive metabolic panel  Hypercholesteremia - Plan: Comprehensive metabolic panel, Lipid panel  NASH (nonalcoholic steatohepatitis)  Elevated alkaline phosphatase level - Plan: Comprehensive metabolic panel  No changes today.    Please be sure medication list is accurate. If a new problem present, please set up appointment sooner than planned today.

## 2017-07-03 NOTE — Assessment & Plan Note (Signed)
No changes in current management, will follow labs done today and will give further recommendations accordingly. F/U in 6-12 months.

## 2017-07-03 NOTE — Assessment & Plan Note (Signed)
Adequately controlled. No changes in current management. DASH-low salt diet to continue. Eye exam is current. F/U in 6 months, before if needed.

## 2017-08-01 DIAGNOSIS — H18603 Keratoconus, unspecified, bilateral: Secondary | ICD-10-CM | POA: Diagnosis not present

## 2017-08-01 DIAGNOSIS — Z947 Corneal transplant status: Secondary | ICD-10-CM | POA: Diagnosis not present

## 2017-08-03 ENCOUNTER — Ambulatory Visit: Payer: PPO | Admitting: Family Medicine

## 2017-08-09 ENCOUNTER — Ambulatory Visit (INDEPENDENT_AMBULATORY_CARE_PROVIDER_SITE_OTHER): Payer: PPO | Admitting: Family Medicine

## 2017-08-09 ENCOUNTER — Encounter: Payer: Self-pay | Admitting: Family Medicine

## 2017-08-09 VITALS — BP 110/80 | HR 73 | Temp 98.3°F | Resp 12 | Wt 158.2 lb

## 2017-08-09 DIAGNOSIS — E663 Overweight: Secondary | ICD-10-CM

## 2017-08-09 DIAGNOSIS — H9311 Tinnitus, right ear: Secondary | ICD-10-CM | POA: Diagnosis not present

## 2017-08-09 DIAGNOSIS — R0989 Other specified symptoms and signs involving the circulatory and respiratory systems: Secondary | ICD-10-CM | POA: Diagnosis not present

## 2017-08-09 DIAGNOSIS — H6122 Impacted cerumen, left ear: Secondary | ICD-10-CM | POA: Diagnosis not present

## 2017-08-09 MED ORDER — LOVASTATIN 40 MG PO TABS: 40.0000 mg | ORAL_TABLET | Freq: Every day | ORAL | 2 refills | Status: DC

## 2017-08-09 NOTE — Progress Notes (Signed)
ume impa       ACUTE VISIT  HPI:  Chief Complaint  Patient presents with  . Otalgia    right  . Tinnitus    Latoya Thompson is a 57 y.o.female here today complaining of 2 weeks of intermittent right earache and tinnitus, "buzzing" noise. No recent URI or travel. She has not noted hearing loss in right ear. No history of trauma. She has not had tinnitus in 2 days.  She has not identified exacerbating or alleviating factors.   Otalgia   There is pain in the right ear. This is a new problem. The current episode started 1 to 4 weeks ago. The problem occurs every few hours. The problem has been waxing and waning. There has been no fever. The pain is mild. Pertinent negatives include no abdominal pain, coughing, ear discharge, headaches, hearing loss, neck pain, rash, rhinorrhea, sore throat or vomiting.    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  Occasionally after taking a shower she had left hearing loss for a few days. No associated earache or drainage.  Mild rhinorrhea and post nasal drainage.   No Hx of recent travel. No sick contact. No known insect bite.  Hx of allergies: Yes  OTC medications for this problem: None    -She is also concerning about weight variations despite of regular physical activity and a healthy diet. She has been losing weight consistently and her clothes fit better, still sometimes her weight goes up so she wonders if this is related to building muscle.    Review of Systems  Constitutional: Negative for activity change, appetite change, fatigue and fever.  HENT: Positive for ear pain and tinnitus. Negative for ear discharge, hearing loss, mouth sores, rhinorrhea, sore throat, trouble swallowing and voice change.   Eyes: Negative for redness and visual disturbance.  Respiratory: Negative for cough, shortness of breath and wheezing.   Gastrointestinal: Negative for abdominal pain, nausea and vomiting.       No changes in bowel habits.    Musculoskeletal: Negative for gait problem, myalgias and neck pain.  Skin: Negative for rash.  Allergic/Immunologic: Positive for environmental allergies.  Neurological: Negative for syncope, weakness and headaches.  Hematological: Negative for adenopathy. Does not bruise/bleed easily.      Current Outpatient Medications on File Prior to Visit  Medication Sig Dispense Refill  . aspirin EC 81 MG tablet Take 81 mg by mouth daily.    . enalapril (VASOTEC) 20 MG tablet Take 1 tablet (20 mg total) by mouth daily. 90 tablet 2  . montelukast (SINGULAIR) 10 MG tablet Take 1 tablet (10 mg total) by mouth at bedtime. 90 tablet 2  . pantoprazole (PROTONIX) 20 MG tablet Take 1 tablet (20 mg total) by mouth daily. 180 tablet 1  . prednisoLONE acetate (PRED FORTE) 1 % ophthalmic suspension Place 1 drop into the left eye daily.     . verapamil (CALAN) 120 MG tablet Take 1 tablet (120 mg total) by mouth daily. 90 tablet 2  . vitamin C (ASCORBIC ACID) 500 MG tablet Take 500 mg by mouth daily.    . vitamin E 400 UNIT capsule Take 400 Units by mouth daily.     No current facility-administered medications on file prior to visit.      Past Medical History:  Diagnosis Date  . Chicken pox   . Hypercholesteremia   . Hypertension   . Keratoconus    Left   Allergies  Allergen Reactions  . Codeine Itching  Social History   Socioeconomic History  . Marital status: Married    Spouse name: Eddie Dibbles  . Number of children: 0  . Years of education: Not on file  . Highest education level: Bachelor's degree (e.g., BA, AB, BS)  Occupational History  . Occupation: Tourist information centre manager  Social Needs  . Financial resource strain: Not on file  . Food insecurity:    Worry: Not on file    Inability: Not on file  . Transportation needs:    Medical: Not on file    Non-medical: Not on file  Tobacco Use  . Smoking status: Never Smoker  . Smokeless tobacco: Never Used  Substance and Sexual Activity  . Alcohol use:  No  . Drug use: No  . Sexual activity: Not on file  Lifestyle  . Physical activity:    Days per week: Not on file    Minutes per session: Not on file  . Stress: Not on file  Relationships  . Social connections:    Talks on phone: Not on file    Gets together: Not on file    Attends religious service: Not on file    Active member of club or organization: Not on file    Attends meetings of clubs or organizations: Not on file    Relationship status: Not on file  Other Topics Concern  . Not on file  Social History Narrative   Married, lives with husband Eddie Dibbles in a 2 story home. Drinks 1/2 cup of coffee a day, rare soda or tea. Exercise regularly for at least 60 minutes.    Vitals:   08/09/17 1012  BP: 110/80  Pulse: 73  Resp: 12  Temp: 98.3 F (36.8 C)  SpO2: 95%   Body mass index is 28.02 kg/m.   Wt Readings from Last 3 Encounters:  08/09/17 158 lb 3.2 oz (71.8 kg)  07/03/17 154 lb 2 oz (69.9 kg)  04/04/17 157 lb 6 oz (71.4 kg)    Physical Exam  Nursing note and vitals reviewed. Constitutional: She is oriented to person, place, and time. She appears well-developed. She does not appear ill. No distress.  HENT:  Head: Normocephalic and atraumatic.  Right Ear: External ear and ear canal normal. No mastoid tenderness. Tympanic membrane is not erythematous and not bulging. A middle ear effusion is present.  Left Ear: External ear normal.  Mouth/Throat: Oropharynx is clear and moist and mucous membranes are normal.  Cerumen excess left ear canal, cannot see TM.  Eyes: Conjunctivae are normal.  Neck: No muscular tenderness present. Carotid bruit is present. No edema and no erythema present.  Cardiovascular: Normal rate and regular rhythm.  No murmur heard. Respiratory: Effort normal and breath sounds normal. No respiratory distress.  Lymphadenopathy:       Head (right side): No submandibular adenopathy present.       Head (left side): No submandibular adenopathy present.     She has no cervical adenopathy.  Neurological: She is alert and oriented to person, place, and time. She has normal strength. Gait normal.  Skin: Skin is warm. No rash noted. No erythema.  Psychiatric: She has a normal mood and affect.  Well groomed, good eye contact.     ASSESSMENT AND PLAN:   Ms. Reesha was seen today for otalgia and tinnitus.  Diagnoses and all orders for this visit:  Tinnitus of right ear  New onset,unilateral. For now we decided to hold on ENT evaluation since she has not had any for 2  days.  Autoinflation maneuvers may help with small right ear effusion.  Instructed about warning signs.  -     VAS US CAROTID; Future  Hearing loss of left ear due to cerumen impaction  Intermittent,not symptomatic now but still could like ear lavage. No complications,she tolerated procedure well. TM with no erythema.  Carotid bruit, unspecified laterality  Bilateral. Continue Lovastatin. Further recommendations will be given according to [place] results.  -     VAS US CAROTID; Future   Overweight (BMI 25.0-29.9)  BMI decreased from 32 to 28. Continue healthy diet and regular exercise.  Other orders -     lovastatin (MEVACOR) 40 MG tablet; Take 1 tablet (40 mg total) by mouth at bedtime.     Betty G. [place], MD  [facility]. [place] office.

## 2017-08-09 NOTE — Patient Instructions (Signed)
A few things to remember from today's visit:   Hearing loss of left ear due to cerumen impaction  Tinnitus of right ear - Plan: VAS US CAROTID  Carotid bruit, unspecified laterality - Plan: VAS US CAROTID  Tinnitus Tinnitus refers to hearing a sound when there is no actual source for that sound. This is often described as ringing in the ears. However, people with this condition may hear a variety of noises. A person may hear the sound in one ear or in both ears. The sounds of tinnitus can be soft, loud, or somewhere in between. Tinnitus can last for a few seconds or can be constant for days. It may go away without treatment and come back at various times. When tinnitus is constant or happens often, it can lead to other problems, such as trouble sleeping and trouble concentrating. Almost everyone experiences tinnitus at some point. Tinnitus that is long-lasting (chronic) or comes back often is a problem that may require medical attention. What are the causes? The cause of tinnitus is often not known. In some cases, it can result from other problems or conditions, including:  Exposure to loud noises from machinery, music, or other sources.  Hearing loss.  Ear or sinus infections.  Earwax buildup.  A foreign object in the ear.  Use of certain medicines.  Use of alcohol and caffeine.  High blood pressure.  Heart diseases.  Anemia.  Allergies.  Meniere disease.  Thyroid problems.  Tumors.  An enlarged part of a weakened blood vessel (aneurysm).  What are the signs or symptoms? The main symptom of tinnitus is hearing a sound when there is no source for that sound. It may sound like:  Buzzing.  Roaring.  Ringing.  Blowing air, similar to the sound heard when you listen to a seashell.  Hissing.  Whistling.  Sizzling.  Humming.  Running water.  A sustained musical note.  How is this diagnosed? Tinnitus is diagnosed based on your symptoms. Your health care  provider will do a physical exam. A comprehensive hearing exam (audiologic exam) will be done if your tinnitus:  Affects only one ear (unilateral).  Causes hearing difficulties.  Lasts 6 months or longer.  You may also need to see a health care provider who specializes in hearing disorders (audiologist). You may be asked to complete a questionnaire to determine the severity of your tinnitus. Tests may be done to help determine the cause and to rule out other conditions. These can include:  Imaging studies of your head and brain, such as: ? A CT scan. ? An MRI.  An imaging study of your blood vessels (angiogram).  How is this treated? Treating an underlying medical condition can sometimes make tinnitus go away. If your tinnitus continues, other treatments may include:  Medicines, such as certain antidepressants or sleeping aids.  Sound generators to mask the tinnitus. These include: ? Tabletop sound machines that play relaxing sounds to help you fall asleep. ? Wearable devices that fit in your ear and play sounds or music. ? A small device that uses headphones to deliver a signal embedded in music (acoustic neural stimulation). In time, this may change the pathways of your brain and make you less sensitive to tinnitus. This device is used for very severe cases when no other treatment is working.  Therapy and counseling to help you manage the stress of living with tinnitus.  Using hearing aids or cochlear implants, if your tinnitus is related to hearing loss.  Follow  these instructions at home:  When possible, avoid being in loud places and being exposed to loud sounds.  Wear hearing protection, such as earplugs, when you are exposed to loud noises.  Do not take stimulants, such as nicotine, alcohol, or caffeine.  Practice techniques for reducing stress, such as meditation, yoga, or deep breathing.  Use a white noise machine, a humidifier, or other devices to mask the sound of  tinnitus.  Sleep with your head slightly raised. This may reduce the impact of tinnitus.  Try to get plenty of rest each night. Contact a health care provider if:  You have tinnitus in just one ear.  Your tinnitus continues for 3 weeks or longer without stopping.  Home care measures are not helping.  You have tinnitus after a head injury.  You have tinnitus along with any of the following: ? Dizziness. ? Loss of balance. ? Nausea and vomiting. This information is not intended to replace advice given to you by your health care provider. Make sure you discuss any questions you have with your health care provider. Document Released: 02/27/2005 Document Revised: 10/31/2015 Document Reviewed: 07/30/2013 Elsevier Interactive Patient Education  2018 [employer].   Please be sure medication list is accurate. If a new problem present, please set up appointment sooner than planned today.

## 2017-08-17 ENCOUNTER — Ambulatory Visit (HOSPITAL_COMMUNITY): Payer: PPO

## 2017-08-31 ENCOUNTER — Ambulatory Visit (HOSPITAL_COMMUNITY): Payer: PPO

## 2017-10-02 ENCOUNTER — Ambulatory Visit (HOSPITAL_COMMUNITY)
Admission: RE | Admit: 2017-10-02 | Payer: PPO | Source: Ambulatory Visit | Attending: Family Medicine | Admitting: Family Medicine

## 2017-10-04 DIAGNOSIS — Z1231 Encounter for screening mammogram for malignant neoplasm of breast: Secondary | ICD-10-CM | POA: Diagnosis not present

## 2017-11-12 DIAGNOSIS — N3001 Acute cystitis with hematuria: Secondary | ICD-10-CM | POA: Diagnosis not present

## 2017-11-20 ENCOUNTER — Other Ambulatory Visit: Payer: Self-pay | Admitting: Family Medicine

## 2017-11-20 ENCOUNTER — Other Ambulatory Visit: Payer: Self-pay | Admitting: *Deleted

## 2017-11-20 DIAGNOSIS — I10 Essential (primary) hypertension: Secondary | ICD-10-CM

## 2017-11-20 MED ORDER — PANTOPRAZOLE SODIUM 20 MG PO TBEC: 20.0000 mg | DELAYED_RELEASE_TABLET | Freq: Every day | ORAL | 1 refills | Status: DC

## 2017-11-20 MED ORDER — MONTELUKAST SODIUM 10 MG PO TABS: 10.0000 mg | ORAL_TABLET | Freq: Every day | ORAL | 2 refills | Status: DC

## 2017-11-20 MED ORDER — ENALAPRIL MALEATE 20 MG PO TABS: 20.0000 mg | ORAL_TABLET | Freq: Every day | ORAL | 0 refills | Status: DC

## 2017-11-20 NOTE — Telephone Encounter (Signed)
Copied from [place] [id number]. Topic: Quick Communication - Rx Refill/Question >> Nov 20, 2017 11:25 AM Synthia Innocent wrote: Medication: pantoprazole (PROTONIX) 20 MG tablet, enalapril (VASOTEC) 20 MG tablet and montelukast (SINGULAIR) 10 MG tablet   Has the patient contacted their pharmacy? Yes.   (Agent: If no, request that the patient contact the pharmacy for the refill.) (Agent: If yes, when and what did the pharmacy advise?)  Preferred Pharmacy (with phone number or street name): [facility] on [address]: Please be advised that RX refills may take up to 3 business days. We ask that you follow-up with your pharmacy.

## 2017-11-20 NOTE — Telephone Encounter (Signed)
Enalapril refilled per BP review/labs 07/03/17  Rx refill request: montelukast 10 mg    Last filled: 10/09/16        Rx not addressed during the year                            Pantoprazole 20 mg   Last filled: 10/09/16  LOV: 08/09/17  PCP: [place]  PCP: verified

## 2017-12-16 ENCOUNTER — Other Ambulatory Visit: Payer: Self-pay | Admitting: Family Medicine

## 2017-12-16 DIAGNOSIS — I10 Essential (primary) hypertension: Secondary | ICD-10-CM

## 2017-12-17 ENCOUNTER — Other Ambulatory Visit: Payer: Self-pay | Admitting: Family Medicine

## 2017-12-17 DIAGNOSIS — I10 Essential (primary) hypertension: Secondary | ICD-10-CM

## 2017-12-17 NOTE — Telephone Encounter (Signed)
Copied from [place] [id number]. Topic: Quick Communication - Other Results >> Dec 17, 2017 11:31 AM Yvette Rack wrote: Medication: verapamil (CALAN) 120 MG tablet and enalapril (VASOTEC) 20 MG tablet  Preferred Pharmacy: [place]. [place] [id number] Phone: [phone] Fax: [phone]

## 2017-12-18 MED ORDER — ENALAPRIL MALEATE 20 MG PO TABS: 20.0000 mg | ORAL_TABLET | Freq: Every day | ORAL | 0 refills | Status: DC

## 2017-12-18 NOTE — Telephone Encounter (Signed)
Requested Prescriptions  Pending Prescriptions Disp Refills  . enalapril (VASOTEC) 20 MG tablet 90 tablet 0    Sig: Take 1 tablet (20 mg total) by mouth daily.     Cardiovascular:  ACE Inhibitors Passed - 12/17/2017 11:57 AM      Passed - Cr in normal range and within 180 days    Creatinine, Ser  Date Value Ref Range Status  07/03/2017 0.93 0.40 - 1.20 mg/dL Final         Passed - K in normal range and within 180 days    Potassium  Date Value Ref Range Status  07/03/2017 4.1 3.5 - 5.1 mEq/L Final         Passed - Patient is not pregnant      Passed - Last BP in normal range    BP Readings from Last 1 Encounters:  08/09/17 110/80         Passed - Valid encounter within last 6 months    Recent Outpatient Visits          4 months ago Tinnitus of right ear   [facility] [place], Malka So, MD   5 months ago Essential hypertension   [facility] [place], Malka So, MD   8 months ago Right knee pain, unspecified chronicity   [facility] [place], Malka So, MD   1 year ago Essential hypertension   [facility] [place], Malka So, MD   1 year ago Dizziness, nonspecific   [facility] [place], Malka So, MD      Future Appointments            In 2 weeks [place], Malka So, MD [employer] [facility] [place], [facility]

## 2017-12-27 ENCOUNTER — Ambulatory Visit (HOSPITAL_COMMUNITY)
Admission: RE | Admit: 2017-12-27 | Discharge: 2017-12-27 | Disposition: A | Discharge status: Home / Self Care | Payer: PPO | Source: Ambulatory Visit | Attending: Cardiology | Admitting: Cardiology

## 2017-12-27 DIAGNOSIS — H9311 Tinnitus, right ear: Secondary | ICD-10-CM

## 2017-12-27 DIAGNOSIS — R0989 Other specified symptoms and signs involving the circulatory and respiratory systems: Secondary | ICD-10-CM | POA: Diagnosis not present

## 2018-01-02 ENCOUNTER — Ambulatory Visit (INDEPENDENT_AMBULATORY_CARE_PROVIDER_SITE_OTHER): Payer: PPO | Admitting: Family Medicine

## 2018-01-02 ENCOUNTER — Encounter: Payer: Self-pay | Admitting: Family Medicine

## 2018-01-02 ENCOUNTER — Ambulatory Visit: Payer: PPO | Admitting: Family Medicine

## 2018-01-02 VITALS — BP 120/84 | HR 70 | Temp 98.2°F | Resp 12 | Ht 63.0 in | Wt 159.0 lb

## 2018-01-02 DIAGNOSIS — K7581 Nonalcoholic steatohepatitis (NASH): Secondary | ICD-10-CM | POA: Diagnosis not present

## 2018-01-02 DIAGNOSIS — E7800 Pure hypercholesterolemia, unspecified: Secondary | ICD-10-CM | POA: Diagnosis not present

## 2018-01-02 DIAGNOSIS — I10 Essential (primary) hypertension: Secondary | ICD-10-CM | POA: Diagnosis not present

## 2018-01-02 DIAGNOSIS — H9311 Tinnitus, right ear: Secondary | ICD-10-CM

## 2018-01-02 DIAGNOSIS — Z23 Encounter for immunization: Secondary | ICD-10-CM

## 2018-01-02 DIAGNOSIS — R251 Tremor, unspecified: Secondary | ICD-10-CM

## 2018-01-02 NOTE — Patient Instructions (Signed)
A few things to remember from today's visit:   Essential hypertension  Tremor, unspecified  Hypercholesteremia  NASH (nonalcoholic steatohepatitis)  Essential Tremor A tremor is trembling or shaking that you cannot control. Most tremors affect the hands or arms. Tremors can also affect the head, vocal cords, face, and other parts of the body. Essential tremor is a tremor without a known cause. What are the causes? Essential tremor has no known cause. What increases the risk? You may be at greater risk of essential tremor if:  You have a family member with essential tremor.  You are age 58 or older.  You take certain medicines.  What are the signs or symptoms? The main sign of a tremor is uncontrolled and unintentional rhythmic shaking of a body part.  You may have difficulty eating with a spoon or fork.  You may have difficulty writing.  You may nod your head up and down or side to side.  You may have a quivering voice.  Your tremors:  May get worse over time.  May come and go.  May be more noticeable on one side of your body.  May get worse due to stress, fatigue, caffeine, and extreme heat or cold.  How is this diagnosed? Your health care provider can diagnose essential tremor based on your symptoms, medical history, and a physical examination. There is no single test to diagnose an essential tremor. However, your health care provider may perform a variety of tests to rule out other conditions. Tests may include:  Blood and urine tests.  Imaging studies of your brain, such as: ? CT scan. ? MRI.  A test that measures involuntary muscle movement (electromyogram).  How is this treated? Your tremors may go away without treatment. Mild tremors may not need treatment if they do not affect your day-to-day life. Severe tremors may need to be treated using one or a combination of the following options:  Medicines. This may include medicine that is  injected.  Lifestyle changes.  Physical therapy.  Follow these instructions at home:  Take medicines only as directed by your health care provider.  Limit alcohol intake to no more than 1 drink per day for nonpregnant women and 2 drinks per day for men. One drink equals 12 oz of beer, 5 oz of wine, or 1 oz of hard liquor.  Do not use any tobacco products, including cigarettes, chewing tobacco, or electronic cigarettes. If you need help quitting, ask your health care provider.  Take medicines only as directed by your health care provider.  Avoid extreme heat or cold.  Limit the amount of caffeine you consumeas directed by your health care provider.  Try to get eight hours of sleep each night.  Find ways to manage your stress, such as meditation or yoga.  Keep all follow-up visits as directed by your health care provider. This is important. This includes any physical therapy visits. Contact a health care provider if:  You experience any changes in the location or intensity of your tremors.  You start having a tremor after starting a new medicine.  You have tremor with other symptoms such as: ? Numbness. ? Tingling. ? Pain. ? Weakness.  Your tremor gets worse.  Your tremor interferes with your daily life. This information is not intended to replace advice given to you by your health care provider. Make sure you discuss any questions you have with your health care provider. Document Released: 03/20/2014 Document Revised: 08/05/2015 Document Reviewed: 08/25/2013 Elsevier Interactive  Patient Education  [employer].  Please be sure medication list is accurate. If a new problem present, please set up appointment sooner than planned today.

## 2018-01-02 NOTE — Assessment & Plan Note (Signed)
Adequately controlled. No changes in current management. Low-salt diet to continue. Eye exam is current.  F/U in 6 months, before if needed.

## 2018-01-02 NOTE — Assessment & Plan Note (Signed)
It has been stable overall. We discussed some side effects of statin medications as well as benefits. Further recommendation will be given according to lab results.

## 2018-01-02 NOTE — Assessment & Plan Note (Signed)
She is not fasting today, she will be back in 2 days. For now she will continue lovastatin 40 mg daily. Low-fat diet also recommended. Follow-up in 6 to 12 months.

## 2018-01-02 NOTE — Assessment & Plan Note (Signed)
Educated about diagnosis. 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 For now I do not think she needs pharmacologic treatment, she concurs. If symptoms get worse we will consider pharmacologic treatment as well as neurology referral.

## 2018-01-02 NOTE — Progress Notes (Signed)
HPI:   Latoya Thompson is a 57 y.o. female, who is here today for 6 months follow up.   She was last seen on 08/09/2017 for acute visit, tinnitus.  Probably has been stable, right ear, worse at night mainly when it is quiet. No hearing loss or ear ache.  US carotid duplex:  Right Carotid: The extracranial vessels were near-normal with only minimal wall  thickening or plaque. Left Carotid: The extracranial vessels were near-normal with only minimal wall thickening or plaque.  Vertebrals: Bilateral vertebral arteries demonstrate antegrade flow. Subclavians: Normal flow hemodynamics were seen in bilateral subclavian arteries.   Since her last OV she has follow with ophthalmologist.  Hypertension:  Currently on enalapril 20 mg daily. She is taking medications as instructed, no side effects reported.  She has not noted unusual headache, visual changes, exertional chest pain, dyspnea,  focal weakness, or edema.   Lab Results  Component Value Date   CREATININE 0.93 07/03/2017   BUN 13 07/03/2017   NA 140 07/03/2017   K 4.1 07/03/2017   CL 105 07/03/2017   CO2 30 07/03/2017    Hyperlipidemia:  Currently on lovastatin 40 mg daily, dose was increased in 06/2017. Following a low fat diet: Yes. She has not noted side effects with medication.  Lab Results  Component Value Date   CHOL 207 (H) 07/03/2017   HDL 51.10 07/03/2017   LDLCALC 137 (H) 07/03/2017   TRIG 96.0 07/03/2017   CHOLHDL 4 07/03/2017   History of abnormal LFTs, [place]. No abdominal pain, nausea, vomiting, or jaundice. She is on vitamin D 400 units daily.  Lab Results  Component Value Date   ALT 18 07/03/2017   AST 24 07/03/2017   ALKPHOS 168 (H) 07/03/2017   BILITOT 0.7 07/03/2017     Today she is concerned about "hands shaking." She has not problem for a couple months, usually is worse at the end of the day and after "doing a lot of things during the day." Her sister also has hand  tremor. It is exacerbated by certain activities like writing and holding the spoon or cup. L>R. She is right handed.  She denies unstable gait.   Review of Systems  Constitutional: Negative for activity change, appetite change, fatigue, fever and unexpected weight change.  HENT: Positive for tinnitus. Negative for hearing loss, mouth sores, nosebleeds and trouble swallowing.   Eyes: Negative for redness and visual disturbance.  Respiratory: Negative for cough, shortness of breath and wheezing.   Cardiovascular: Negative for chest pain, palpitations and leg swelling.  Gastrointestinal: Negative for abdominal pain, nausea and vomiting.       Negative for changes in bowel habits.  Genitourinary: Negative for decreased urine volume, difficulty urinating, dysuria and hematuria.  Musculoskeletal: Negative for gait problem and myalgias.  Neurological: Positive for tremors. Negative for syncope, weakness, numbness and headaches.     Current Outpatient Medications on File Prior to Visit  Medication Sig Dispense Refill  . aspirin EC 81 MG tablet Take 81 mg by mouth daily.    . enalapril (VASOTEC) 20 MG tablet Take 1 tablet (20 mg total) by mouth daily. 90 tablet 0  . lovastatin (MEVACOR) 40 MG tablet Take 1 tablet (40 mg total) by mouth at bedtime. 90 tablet 2  . montelukast (SINGULAIR) 10 MG tablet Take 1 tablet (10 mg total) by mouth at bedtime. 90 tablet 2  . pantoprazole (PROTONIX) 20 MG tablet Take 1 tablet (20 mg total) by  mouth daily. 180 tablet 1  . prednisoLONE acetate (PRED FORTE) 1 % ophthalmic suspension Place 1 drop into the left eye daily.     . verapamil (CALAN) 120 MG tablet TAKE 1 TABLET BY MOUTH ONCE DAILY 90 tablet 2  . vitamin C (ASCORBIC ACID) 500 MG tablet Take 500 mg by mouth daily.    . vitamin E 400 UNIT capsule Take 400 Units by mouth daily.     No current facility-administered medications on file prior to visit.      Past Medical History:  Diagnosis Date  .  Chicken pox   . Hypercholesteremia   . Hypertension   . Keratoconus    Left   Allergies  Allergen Reactions  . Codeine Itching    Social History   Socioeconomic History  . Marital status: Married    Spouse name: Eddie Dibbles  . Number of children: 0  . Years of education: Not on file  . Highest education level: Bachelor's degree (e.g., BA, AB, BS)  Occupational History  . Occupation: Tourist information centre manager  Social Needs  . Financial resource strain: Not on file  . Food insecurity:    Worry: Not on file    Inability: Not on file  . Transportation needs:    Medical: Not on file    Non-medical: Not on file  Tobacco Use  . Smoking status: Never Smoker  . Smokeless tobacco: Never Used  Substance and Sexual Activity  . Alcohol use: No  . Drug use: No  . Sexual activity: Not on file  Lifestyle  . Physical activity:    Days per week: Not on file    Minutes per session: Not on file  . Stress: Not on file  Relationships  . Social connections:    Talks on phone: Not on file    Gets together: Not on file    Attends religious service: Not on file    Active member of club or organization: Not on file    Attends meetings of clubs or organizations: Not on file    Relationship status: Not on file  Other Topics Concern  . Not on file  Social History Narrative   Married, lives with husband Eddie Dibbles in a 2 story home. Drinks 1/2 cup of coffee a day, rare soda or tea. Exercise regularly for at least 60 minutes.    Vitals:   01/02/18 1134  BP: 120/84  Pulse: 70  Resp: 12  Temp: 98.2 F (36.8 C)  SpO2: 99%   Body mass index is 28.17 kg/m.   Physical Exam  Nursing note and vitals reviewed. Constitutional: She is oriented to person, place, and time. She appears well-developed. No distress.  HENT:  Head: Normocephalic and atraumatic.  Mouth/Throat: Oropharynx is clear and moist and mucous membranes are normal.  Eyes: Conjunctivae are normal. Left pupil is round and reactive.  Cardiovascular:  Normal rate and regular rhythm.  No murmur heard. Pulses:      Dorsalis pedis pulses are 2+ on the right side, and 2+ on the left side.  Respiratory: Effort normal and breath sounds normal. No respiratory distress.  GI: Soft. She exhibits no mass. There is no hepatomegaly. There is no tenderness.  Musculoskeletal: She exhibits no edema.  Lymphadenopathy:    She has no cervical adenopathy.  Neurological: She is alert and oriented to person, place, and time. She has normal strength. She displays tremor (Minimal head tremor appreciated with movement.  It is not present when still.). No cranial nerve deficit.  Coordination and gait normal.  Reflex Scores:      Patellar reflexes are 2+ on the right side and 2+ on the left side. Skin: Skin is warm. No rash noted. No erythema.  Psychiatric: She has a normal mood and affect.  Well groomed, good eye contact.       ASSESSMENT AND PLAN:   Latoya Thompson was seen today for 6 months follow-up.  Orders Placed This Encounter  Procedures  . Flu Vaccine QUAD 36+ mos IM  . Comprehensive metabolic panel  . TSH  . Vitamin B12  . Lipid panel   Lab Results  Component Value Date   VITAMINB12 405 01/04/2018   Lab Results  Component Value Date   TSH 0.76 01/04/2018   Lab Results  Component Value Date   ALT 15 01/04/2018   AST 22 01/04/2018   ALKPHOS 142 (H) 01/04/2018   BILITOT 0.8 01/04/2018   Lab Results  Component Value Date   CREATININE 1.07 01/04/2018   BUN 21 01/04/2018   NA 142 01/04/2018   K 4.2 01/04/2018   CL 106 01/04/2018   CO2 23 01/04/2018    Lab Results  Component Value Date   CHOL 182 01/04/2018   HDL 50.20 01/04/2018   LDLCALC 115 (H) 01/04/2018   TRIG 83.0 01/04/2018   CHOLHDL 4 01/04/2018    Hypertension Adequately controlled. No changes in current management. Low-salt diet to continue. Eye exam is current.  F/U in 6 months, before if needed.   Hypercholesteremia She is not fasting today, she  will be back in 2 days. For now she will continue lovastatin 40 mg daily. Low-fat diet also recommended. Follow-up in 6 to 12 months.  NASH (nonalcoholic steatohepatitis) It has been stable overall. We discussed some side effects of statin medications as well as benefits. Further recommendation will be given according to lab results.  Tremor, unspecified Educated about diagnosis. 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 For now I do not think she needs pharmacologic treatment, she concurs. If symptoms get worse we will consider pharmacologic treatment as well as neurology referral.  Tinnitus, right ear Stable. We will continue monitoring. Instructed about warning signs.     Hyrum Shaneyfelt G. Martinique, MD  [facility]. [place] office.

## 2018-01-02 NOTE — Assessment & Plan Note (Signed)
Stable. We will continue monitoring. Instructed about warning signs.

## 2018-01-04 ENCOUNTER — Other Ambulatory Visit (INDEPENDENT_AMBULATORY_CARE_PROVIDER_SITE_OTHER): Payer: PPO

## 2018-01-04 DIAGNOSIS — R251 Tremor, unspecified: Secondary | ICD-10-CM | POA: Diagnosis not present

## 2018-01-04 DIAGNOSIS — K7581 Nonalcoholic steatohepatitis (NASH): Secondary | ICD-10-CM

## 2018-01-04 DIAGNOSIS — I10 Essential (primary) hypertension: Secondary | ICD-10-CM

## 2018-01-04 DIAGNOSIS — E7800 Pure hypercholesterolemia, unspecified: Secondary | ICD-10-CM

## 2018-01-04 LAB — LIPID PANEL
Cholesterol: 182 mg/dL (ref 0–200)
HDL: 50.2 mg/dL (ref >39.00)
LDL Cholesterol: 115 mg/dL — ABNORMAL HIGH (ref 0–99)
NONHDL: 131.32
TRIGLYCERIDES: 83 mg/dL (ref 0.0–149.0)
Total CHOL/HDL Ratio: 4
VLDL: 16.6 mg/dL (ref 0.0–40.0)

## 2018-01-04 LAB — COMPREHENSIVE METABOLIC PANEL
ALBUMIN: 4.4 g/dL (ref 3.5–5.2)
ALK PHOS: 142 U/L — AB (ref 39–117)
ALT: 15 U/L (ref 0–35)
AST: 22 U/L (ref 0–37)
BILIRUBIN TOTAL: 0.8 mg/dL (ref 0.2–1.2)
BUN: 21 mg/dL (ref 6–23)
CALCIUM: 9.6 mg/dL (ref 8.4–10.5)
CO2: 23 mEq/L (ref 19–32)
CREATININE: 1.07 mg/dL (ref 0.40–1.20)
Chloride: 106 mEq/L (ref 96–112)
GFR: 67.83 mL/min (ref >60.00)
Glucose, Bld: 89 mg/dL (ref 70–99)
Potassium: 4.2 mEq/L (ref 3.5–5.1)
SODIUM: 142 meq/L (ref 135–145)
TOTAL PROTEIN: 7.6 g/dL (ref 6.0–8.3)

## 2018-01-04 LAB — TSH: TSH: 0.76 u[IU]/mL (ref 0.35–4.50)

## 2018-01-04 LAB — VITAMIN B12: Vitamin B-12: 405 pg/mL (ref 211–911)

## 2018-02-04 IMAGING — CR DG CHEST 2V
2 series · 2 of 2 positions shown · non-contrast
Comparison: 09/02/2015 chest radiograph.

CLINICAL DATA: 56 y/o  F; chest pain.

EXAM:
CHEST  2 VIEW

[chest pa]
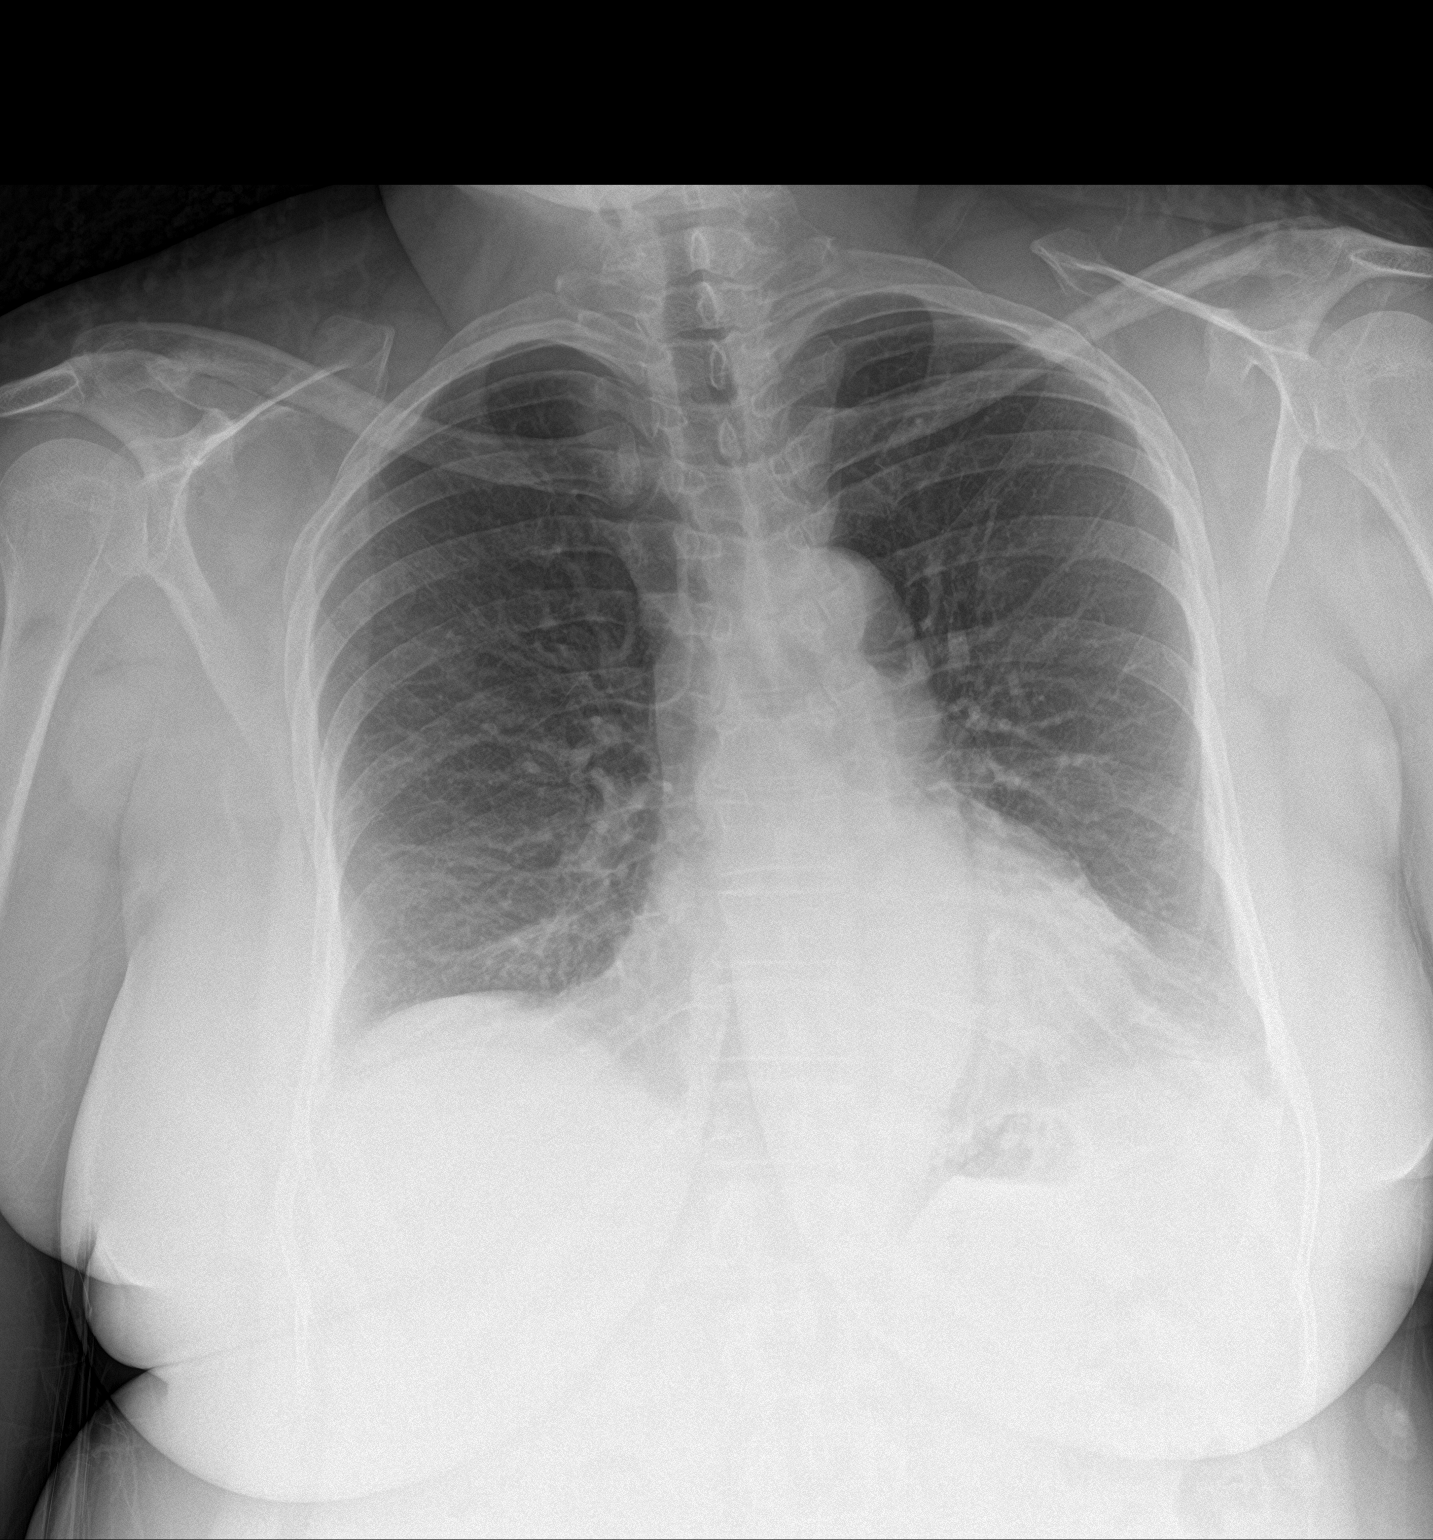

[chest lat]
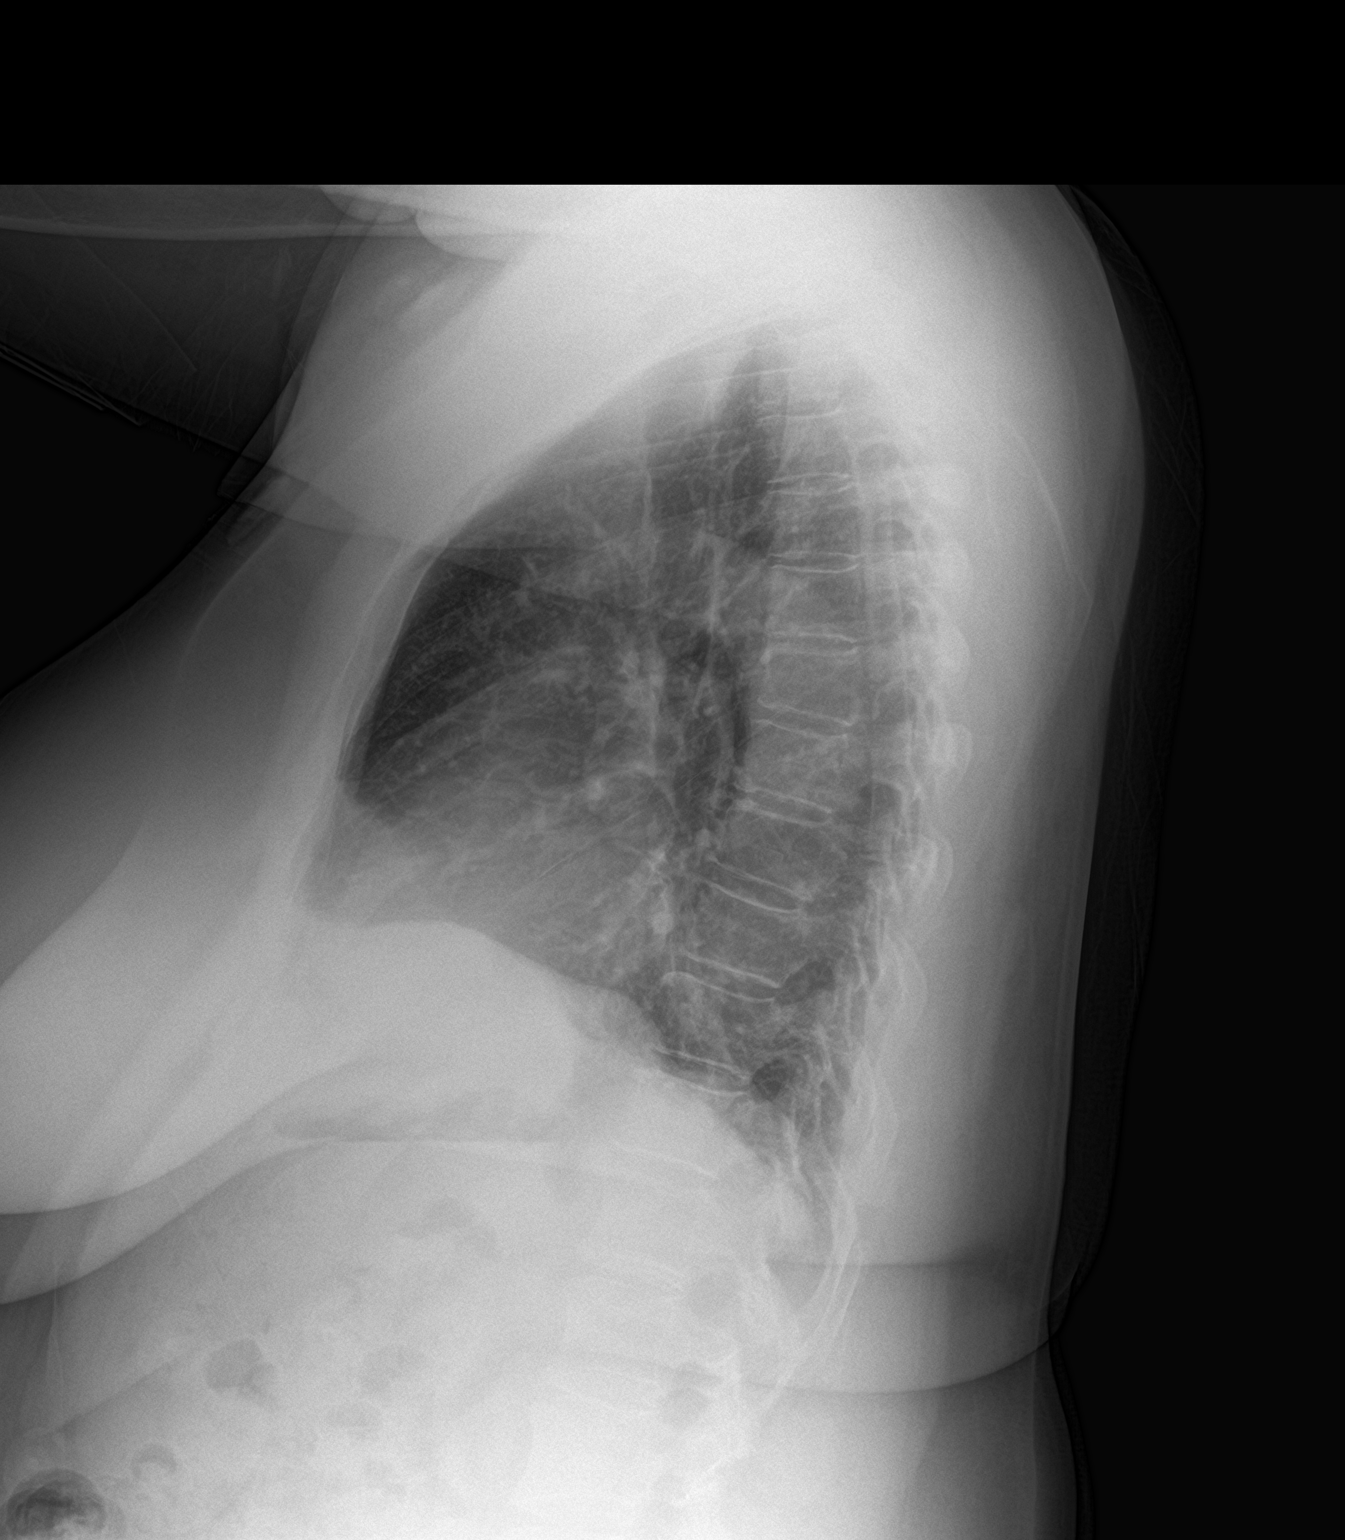

[2 of 2 positions shown; findings below may reference images not displayed]

FINDINGS: Stable heart size and mediastinal contours are within normal limits.
Both lungs are clear. Mild reverse S curvature of the spine.
IMPRESSION: No active cardiopulmonary disease.

By: Endrii Herri M.D.

## 2018-03-11 DIAGNOSIS — T1502XA Foreign body in cornea, left eye, initial encounter: Secondary | ICD-10-CM | POA: Diagnosis not present

## 2018-03-22 DIAGNOSIS — T1502XA Foreign body in cornea, left eye, initial encounter: Secondary | ICD-10-CM | POA: Diagnosis not present

## 2018-04-03 ENCOUNTER — Telehealth: Payer: Self-pay

## 2018-04-03 NOTE — Telephone Encounter (Signed)
Author phoned pt. To set up initial AWV. Appointment requested for summer timeframe. Appointment made for 7/20.

## 2018-06-14 ENCOUNTER — Other Ambulatory Visit: Payer: Self-pay | Admitting: *Deleted

## 2018-06-14 MED ORDER — PANTOPRAZOLE SODIUM 20 MG PO TBEC: 20.0000 mg | DELAYED_RELEASE_TABLET | Freq: Every day | ORAL | 1 refills | Status: DC

## 2018-06-16 ENCOUNTER — Other Ambulatory Visit: Payer: Self-pay | Admitting: Family Medicine

## 2018-06-16 DIAGNOSIS — I10 Essential (primary) hypertension: Secondary | ICD-10-CM

## 2018-07-03 ENCOUNTER — Ambulatory Visit (INDEPENDENT_AMBULATORY_CARE_PROVIDER_SITE_OTHER): Payer: PPO | Admitting: Family Medicine

## 2018-07-03 ENCOUNTER — Other Ambulatory Visit: Payer: Self-pay

## 2018-07-03 ENCOUNTER — Encounter: Payer: Self-pay | Admitting: Family Medicine

## 2018-07-03 VITALS — BP 127/78 | HR 85 | Resp 12

## 2018-07-03 DIAGNOSIS — E7800 Pure hypercholesterolemia, unspecified: Secondary | ICD-10-CM

## 2018-07-03 DIAGNOSIS — H9311 Tinnitus, right ear: Secondary | ICD-10-CM | POA: Diagnosis not present

## 2018-07-03 DIAGNOSIS — K219 Gastro-esophageal reflux disease without esophagitis: Secondary | ICD-10-CM | POA: Insufficient documentation

## 2018-07-03 DIAGNOSIS — R251 Tremor, unspecified: Secondary | ICD-10-CM

## 2018-07-03 DIAGNOSIS — G4700 Insomnia, unspecified: Secondary | ICD-10-CM

## 2018-07-03 DIAGNOSIS — I10 Essential (primary) hypertension: Secondary | ICD-10-CM | POA: Diagnosis not present

## 2018-07-03 MED ORDER — ZOLPIDEM TARTRATE 5 MG PO TABS: 2.5000 mg | ORAL_TABLET | Freq: Every evening | ORAL | 1 refills | Status: DC | PRN

## 2018-07-03 NOTE — Assessment & Plan Note (Signed)
She is now interested in treatment of hot flashes. Good sleep hygiene. We discussed other pharmacologic treatments and some side effects of Ambien. Ambien 2.5-5 mg as needed at bedtime to start. Follow-up in 3 to 4 months.

## 2018-07-03 NOTE — Assessment & Plan Note (Signed)
BP adequately controlled. No changes in current management. Continue low-salt diet. Eye exam is current.

## 2018-07-03 NOTE — Assessment & Plan Note (Signed)
Symptoms have improved. We discussed some side effects of PPIs. As far as she is asymptomatic, she can continue Protonix 20 mg daily as needed. GERD precautions also recommended.

## 2018-07-03 NOTE — Assessment & Plan Note (Signed)
Most likely essential tremor, or other possible causes discussed. Problem is stable. For now she is interested in medication. Clearly instructed about warning signs.

## 2018-07-03 NOTE — Progress Notes (Signed)
Virtual Visit via Video Note   I connected with Latoya Thompson on 07/03/18 at 10:45 AM EDT by a video enabled telemedicine application and verified that I am speaking with the correct person using two identifiers.  Location patient: home Location provider:home office Persons participating in the virtual visit: patient, provider  I discussed the limitations of evaluation and management by telemedicine and the availability of in person appointments. She expressed understanding and agreed to proceed.   HPI: Latoya Thompson is a 58 yo female with hx of HLD,abnormal LFT's,HTN,and Ramsay Hunt auricular synd;seen today for chronic disease management.  She was last seen on 01/02/2018, at that time she was complaining about hand tremor. Problem has been stable. It is worse in the morning and with certain activities like applying make-up and writing. She denies hand numbness, tingling, or focal deficit. Her sister has history of tremor.  Hypertension:  Currently on enalapril 20 mg daily and Verapamil 120 mg daily. She is taking medications as instructed, no side effects reported.  Negative for unusual headache, visual changes, exertional chest pain, dyspnea,  focal weakness, or edema.   Lab Results  Component Value Date   CREATININE 1.07 01/04/2018   BUN 21 01/04/2018   NA 142 01/04/2018   K 4.2 01/04/2018   CL 106 01/04/2018   CO2 23 01/04/2018    NASH/abnormal LFT's: Currently she is on Vit E 400 U daily.  Denies abdominal pain, nausea, vomiting, changes in bowel habits, blood in stool,or jaundice. Lab Results  Component Value Date   ALT 15 01/04/2018   AST 22 01/04/2018   ALKPHOS 142 (H) 01/04/2018   BILITOT 0.8 01/04/2018    GERD: Protonix 40 mg has helped with heartburn, no longer symptomatic. She is taking PPI every other day because because she is "gas."  Today she is complaining about insomnia, exacerbated by hot flashes. She has difficulty falling asleep and wakes up every  hour due to hot flashes. In the past she has taken Ambien, which helps, she requesting a prescription.  She follows with gynecologist, wh according to pt offered treatment but she refused. Hyperlipidemia, currently she is on lovastatin 40 mg daily. She is still following low-fat diet and exercising regularly. Tolerating medication well, no side effects reported.  Lab Results  Component Value Date   CHOL 182 01/04/2018   HDL 50.20 01/04/2018   LDLCALC 115 (H) 01/04/2018   TRIG 83.0 01/04/2018   CHOLHDL 4 01/04/2018     Right ear tinnitus, which has been going on for a few months but worse for the past week. Carotid ultrasound was otherwise negative. She denies earache, hearing loss, dizziness, or ear drainage.  She has not identified exacerbating or alleviating factors. It is intermittently and worse at night.  ROS: See pertinent positives and negatives per HPI. COVID-19 screening questions: Denies new fever,cough,sore throat,or possible exposure to COVID-19. Negative for loss in the sense of smell or taste.  Past Medical History:  Diagnosis Date  . Chicken pox   . Hypercholesteremia   . Hypertension   . Keratoconus    Left    Past Surgical History:  Procedure Laterality Date  . BREAST SURGERY     biopsy  . CORNEAL TRANSPLANT      Family History  Problem Relation Age of Onset  . Hyperlipidemia Mother   . Heart disease Mother   . Stroke Mother   . Hypertension Mother   . Diabetes Mother   . Hyperlipidemia Father   . Heart disease  Father   . Stroke Father   . Hypertension Father   . Diabetes Father     Social History   Socioeconomic History  . Marital status: Married    Spouse name: Eddie Dibbles  . Number of children: 0  . Years of education: Not on file  . Highest education level: Bachelor's degree (e.g., BA, AB, BS)  Occupational History  . Occupation: Tourist information centre manager  Social Needs  . Financial resource strain: Not on file  . Food insecurity:    Worry: Not on  file    Inability: Not on file  . Transportation needs:    Medical: Not on file    Non-medical: Not on file  Tobacco Use  . Smoking status: Never Smoker  . Smokeless tobacco: Never Used  Substance and Sexual Activity  . Alcohol use: No  . Drug use: No  . Sexual activity: Not on file  Lifestyle  . Physical activity:    Days per week: Not on file    Minutes per session: Not on file  . Stress: Not on file  Relationships  . Social connections:    Talks on phone: Not on file    Gets together: Not on file    Attends religious service: Not on file    Active member of club or organization: Not on file    Attends meetings of clubs or organizations: Not on file    Relationship status: Not on file  . Intimate partner violence:    Fear of current or ex partner: Not on file    Emotionally abused: Not on file    Physically abused: Not on file    Forced sexual activity: Not on file  Other Topics Concern  . Not on file  Social History Narrative   Married, lives with husband Eddie Dibbles in a 2 story home. Drinks 1/2 cup of coffee a day, rare soda or tea. Exercise regularly for at least 60 minutes.      Current Outpatient Medications:  .  aspirin EC 81 MG tablet, Take 81 mg by mouth daily., Disp: , Rfl:  .  enalapril (VASOTEC) 20 MG tablet, Take 1 tablet by mouth once daily, Disp: 90 tablet, Rfl: 0 .  lovastatin (MEVACOR) 40 MG tablet, Take 1 tablet (40 mg total) by mouth at bedtime., Disp: 90 tablet, Rfl: 2 .  montelukast (SINGULAIR) 10 MG tablet, Take 1 tablet (10 mg total) by mouth at bedtime., Disp: 90 tablet, Rfl: 2 .  pantoprazole (PROTONIX) 20 MG tablet, Take 1 tablet (20 mg total) by mouth daily., Disp: 180 tablet, Rfl: 1 .  prednisoLONE acetate (PRED FORTE) 1 % ophthalmic suspension, Place 1 drop into the left eye daily. , Disp: , Rfl:  .  verapamil (CALAN) 120 MG tablet, TAKE 1 TABLET BY MOUTH ONCE DAILY, Disp: 90 tablet, Rfl: 2 .  vitamin C (ASCORBIC ACID) 500 MG tablet, Take 500 mg  by mouth daily., Disp: , Rfl:  .  vitamin E 400 UNIT capsule, Take 400 Units by mouth daily., Disp: , Rfl:  .  zolpidem (AMBIEN) 5 MG tablet, Take 0.5-1 tablets (2.5-5 mg total) by mouth at bedtime as needed for sleep., Disp: 15 tablet, Rfl: 1  EXAM:  VITALS per patient if applicable:BP 161/09   Pulse 85   Resp 12   GENERAL: alert, oriented, appears well and in no acute distress  HEENT: atraumatic, conjunttiva clear, no obvious abnormalities on inspection of external nose and ears  NECK: normal movements of the head and  neck  LUNGS: on inspection no signs of respiratory distress, breathing rate appears normal, no obvious gross SOB, gasping or wheezing  CV: no obvious cyanosis  Latoya: moves all visible extremities without noticeable abnormality  PSYCH/NEURO: pleasant and cooperative, no obvious depression or anxiety, speech and thought processing grossly intact  ASSESSMENT AND PLAN:  Discussed the following assessment and plan:  Orders Placed This Encounter  Procedures  . Ambulatory referral to ENT    GERD (gastroesophageal reflux disease) Symptoms have improved. We discussed some side effects of PPIs. As far as she is asymptomatic, she can continue Protonix 20 mg daily as needed. GERD precautions also recommended.  Tinnitus, right ear Getting worse. We discussed possible etiologies. Options: Brain MRI with contrast vs ENT evaluation, she would like the latter one. ENT referral placed.  Tremor, unspecified Most likely essential tremor, or other possible causes discussed. Problem is stable. For now she is interested in medication. Clearly instructed about warning signs.  Hypercholesteremia Continue lovastatin 40 mg daily. Continue low-fat diet. We will plan on checking lipid panel in 4 months.  Hypertension BP adequately controlled. No changes in current management. Continue low-salt diet. Eye exam is current.  Insomnia She is now interested in treatment of  hot flashes. Good sleep hygiene. We discussed other pharmacologic treatments and some side effects of Ambien. Ambien 2.5-5 mg as needed at bedtime to start. Follow-up in 3 to 4 months.   I discussed the assessment and treatment plan with the patient. She was provided an opportunity to ask questions and all were answered. The patient agreed with the plan and demonstrated an understanding of the instructions.   The patient was advised to call back or seek an in-person evaluation if the symptoms worsen or if the condition fails to improve as anticipated.  Return in about 3 months (around 10/02/2018).    Betty Martinique, MD

## 2018-07-03 NOTE — Assessment & Plan Note (Signed)
Getting worse. We discussed possible etiologies. Options: Brain MRI with contrast vs ENT evaluation, she would like the latter one. ENT referral placed.

## 2018-07-03 NOTE — Assessment & Plan Note (Signed)
Continue lovastatin 40 mg daily. Continue low-fat diet. We will plan on checking lipid panel in 4 months.

## 2018-07-24 ENCOUNTER — Other Ambulatory Visit: Payer: Self-pay | Admitting: Family Medicine

## 2018-08-12 ENCOUNTER — Other Ambulatory Visit: Payer: Self-pay | Admitting: Family Medicine

## 2018-09-16 ENCOUNTER — Other Ambulatory Visit: Payer: Self-pay | Admitting: Family Medicine

## 2018-09-16 DIAGNOSIS — I10 Essential (primary) hypertension: Secondary | ICD-10-CM

## 2018-09-30 ENCOUNTER — Ambulatory Visit: Payer: PPO

## 2018-10-14 DIAGNOSIS — Z1231 Encounter for screening mammogram for malignant neoplasm of breast: Secondary | ICD-10-CM | POA: Diagnosis not present

## 2018-10-14 DIAGNOSIS — R87610 Atypical squamous cells of undetermined significance on cytologic smear of cervix (ASC-US): Secondary | ICD-10-CM | POA: Diagnosis not present

## 2018-10-14 DIAGNOSIS — Z01419 Encounter for gynecological examination (general) (routine) without abnormal findings: Secondary | ICD-10-CM | POA: Diagnosis not present

## 2018-10-14 DIAGNOSIS — Z124 Encounter for screening for malignant neoplasm of cervix: Secondary | ICD-10-CM | POA: Diagnosis not present

## 2018-10-14 DIAGNOSIS — Z6827 Body mass index (BMI) 27.0-27.9, adult: Secondary | ICD-10-CM | POA: Diagnosis not present

## 2018-10-16 ENCOUNTER — Other Ambulatory Visit: Payer: Self-pay | Admitting: Family Medicine

## 2018-11-11 ENCOUNTER — Other Ambulatory Visit: Payer: Self-pay | Admitting: Family Medicine

## 2018-11-13 ENCOUNTER — Ambulatory Visit: Payer: PPO

## 2018-11-15 ENCOUNTER — Ambulatory Visit: Payer: PPO | Admitting: Family Medicine

## 2018-11-20 ENCOUNTER — Ambulatory Visit: Payer: PPO | Admitting: Family Medicine

## 2018-11-26 ENCOUNTER — Encounter: Payer: Self-pay | Admitting: Family Medicine

## 2018-11-26 ENCOUNTER — Ambulatory Visit (INDEPENDENT_AMBULATORY_CARE_PROVIDER_SITE_OTHER): Payer: PPO | Admitting: Family Medicine

## 2018-11-26 ENCOUNTER — Other Ambulatory Visit: Payer: Self-pay

## 2018-11-26 VITALS — BP 130/64 | HR 80 | Temp 97.7°F | Resp 12 | Ht 63.0 in | Wt 159.2 lb

## 2018-11-26 DIAGNOSIS — E7800 Pure hypercholesterolemia, unspecified: Secondary | ICD-10-CM | POA: Diagnosis not present

## 2018-11-26 DIAGNOSIS — Z23 Encounter for immunization: Secondary | ICD-10-CM

## 2018-11-26 DIAGNOSIS — H9311 Tinnitus, right ear: Secondary | ICD-10-CM | POA: Diagnosis not present

## 2018-11-26 DIAGNOSIS — I10 Essential (primary) hypertension: Secondary | ICD-10-CM

## 2018-11-26 DIAGNOSIS — K7581 Nonalcoholic steatohepatitis (NASH): Secondary | ICD-10-CM | POA: Diagnosis not present

## 2018-11-26 DIAGNOSIS — G4700 Insomnia, unspecified: Secondary | ICD-10-CM

## 2018-11-26 DIAGNOSIS — Z0000 Encounter for general adult medical examination without abnormal findings: Secondary | ICD-10-CM

## 2018-11-26 LAB — LIPID PANEL
Cholesterol: 193 mg/dL (ref 0–200)
HDL: 51.9 mg/dL (ref >39.00)
LDL Cholesterol: 124 mg/dL — ABNORMAL HIGH (ref 0–99)
NonHDL: 140.6
Total CHOL/HDL Ratio: 4
Triglycerides: 81 mg/dL (ref 0.0–149.0)
VLDL: 16.2 mg/dL (ref 0.0–40.0)

## 2018-11-26 LAB — COMPREHENSIVE METABOLIC PANEL
ALT: 13 U/L (ref 0–35)
AST: 19 U/L (ref 0–37)
Albumin: 4.4 g/dL (ref 3.5–5.2)
Alkaline Phosphatase: 114 U/L (ref 39–117)
BUN: 18 mg/dL (ref 6–23)
CO2: 29 mEq/L (ref 19–32)
Calcium: 9.9 mg/dL (ref 8.4–10.5)
Chloride: 106 mEq/L (ref 96–112)
Creatinine, Ser: 0.93 mg/dL (ref 0.40–1.20)
GFR: 74.8 mL/min (ref >60.00)
Glucose, Bld: 80 mg/dL (ref 70–99)
Potassium: 4 mEq/L (ref 3.5–5.1)
Sodium: 143 mEq/L (ref 135–145)
Total Bilirubin: 0.6 mg/dL (ref 0.2–1.2)
Total Protein: 7.3 g/dL (ref 6.0–8.3)

## 2018-11-26 NOTE — Progress Notes (Signed)
HPI:   Latoya Thompson is a 58 y.o. female, who is here today for chronic disease management and AWV. She was last seen on 07/03/18.  She lives with her husband. Independent ADL's and IADL's. No falls in the past year and denies depression symptoms.  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  Right ear tinnitus, referred to ENT. According to pt,she needs to have a hearing test done.   Functional Status Survey: Is the patient deaf or have difficulty hearing?: No Does the patient have difficulty seeing, even when wearing glasses/contacts?: Yes Does the patient have difficulty concentrating, remembering, or making decisions?: No Does the patient have difficulty walking or climbing stairs?: No Does the patient have difficulty dressing or bathing?: No Does the patient have difficulty doing errands alone such as visiting a doctor's office or shopping?: No  Fall Risk  11/26/2018  Falls in the past year? 0  Number falls in past yr: 0  Injury with Fall? 0  Follow up Education provided    Providers she sees regularly: Eye care provider: Dr Malcolm Metro, ophthalmologist. Hx of bilateral keratoconus, s/p bilateral corneal transplant, left eye blindness. Neurologist: Dr Tomi Likens, peripheral vertigo. Gyn: Dr Deatra Ina.  Depression screen PHQ 2/9 11/26/2018  Decreased Interest 0  Down, Depressed, Hopeless 0  PHQ - 2 Score 0     Hearing Screening   125Hz  250Hz  500Hz  1000Hz  2000Hz  3000Hz  4000Hz  6000Hz  8000Hz   Right ear:           Left ear:             Visual Acuity Screening   Right eye Left eye Both eyes  Without correction: 20/20  20/20  With correction:       HLD She is on Lovastatin 40 mg daily. Tolerating medication well. She is following low fat diet and exercising regularly (walking and doing set ups).    Lab Results  Component Value Date   CHOL 182 01/04/2018   HDL 50.20 01/04/2018   LDLCALC 115 (H) 01/04/2018   TRIG 83.0 01/04/2018   CHOLHDL 4 01/04/2018   HTN:  Taking Enalapril 20 mg and Verapamil 120 mg daily. Denies severe/frequent headache, visual changes, chest pain, dyspnea, palpitation, claudication, focal weakness, or edema.  Lab Results  Component Value Date   CREATININE 1.07 01/04/2018   BUN 21 01/04/2018   NA 142 01/04/2018   K 4.2 01/04/2018   CL 106 01/04/2018   CO2 23 01/04/2018   NASH: She has not noted abdominal pain,N/V,or changes in bowel habits.  Lab Results  Component Value Date   ALT 15 01/04/2018   AST 22 01/04/2018   ALKPHOS 142 (H) 01/04/2018   BILITOT 0.8 01/04/2018   Insomnia: She takes Ambien 2.5-5 mg seldom. No side effects reported.  Review of Systems  Constitutional: Negative for activity change, appetite change and fatigue.  HENT: Positive for tinnitus (Right side.).   Eyes: Negative for pain and discharge.  Gastrointestinal: Negative for blood in stool.  Endocrine: Negative for cold intolerance, heat intolerance, polydipsia, polyphagia and polyuria.  Genitourinary: Negative for decreased urine volume, dysuria and hematuria.  Musculoskeletal: Negative for gait problem and myalgias.  Skin: Negative for rash.  Neurological: Positive for tremors (left-side, stable. It is worse at the end of the day.It does not interfer with activites.). Negative for syncope and facial asymmetry.  Rest see pertinent positives  and negatives per HPI.   Current Outpatient Medications on File Prior to Visit  Medication Sig Dispense Refill  . aspirin EC 81 MG tablet Take 81 mg by mouth daily.    . enalapril (VASOTEC) 20 MG tablet Take 1 tablet by mouth once daily 90 tablet 0  . lovastatin (MEVACOR) 40 MG tablet TAKE 1 TABLET BY MOUTH AT BEDTIME 90 tablet 0  . montelukast (SINGULAIR) 10 MG tablet TAKE 1 TABLET BY MOUTH AT BEDTIME 90 tablet 0  . pantoprazole  (PROTONIX) 20 MG tablet Take 1 tablet (20 mg total) by mouth daily. 180 tablet 1  . prednisoLONE acetate (PRED FORTE) 1 % ophthalmic suspension Place 1 drop into the left eye daily.     . verapamil (CALAN) 120 MG tablet Take 1 tablet by mouth once daily 90 tablet 0  . vitamin C (ASCORBIC ACID) 500 MG tablet Take 500 mg by mouth daily.    . vitamin E 400 UNIT capsule Take 400 Units by mouth daily.    Marland Kitchen zolpidem (AMBIEN) 5 MG tablet Take 0.5-1 tablets (2.5-5 mg total) by mouth at bedtime as needed for sleep. 15 tablet 1   No current facility-administered medications on file prior to visit.      Past Medical History:  Diagnosis Date  . Chicken pox   . Hypercholesteremia   . Hypertension   . Keratoconus    Left   Allergies  Allergen Reactions  . Codeine Itching    Social History   Socioeconomic History  . Marital status: Married    Spouse name: Eddie Dibbles  . Number of children: 0  . Years of education: Not on file  . Highest education level: Bachelor's degree (e.g., BA, AB, BS)  Occupational History  . Occupation: Tourist information centre manager  Social Needs  . Financial resource strain: Not on file  . Food insecurity    Worry: Not on file    Inability: Not on file  . Transportation needs    Medical: Not on file    Non-medical: Not on file  Tobacco Use  . Smoking status: Never Smoker  . Smokeless tobacco: Never Used  Substance and Sexual Activity  . Alcohol use: No  . Drug use: No  . Sexual activity: Not on file  Lifestyle  . Physical activity    Days per week: Not on file    Minutes per session: Not on file  . Stress: Not on file  Relationships  . Social Herbalist on phone: Not on file    Gets together: Not on file    Attends religious service: Not on file    Active member of club or organization: Not on file    Attends meetings of clubs or organizations: Not on file    Relationship status: Not on file  Other Topics Concern  . Not on file  Social History Narrative    Married, lives with husband Eddie Dibbles in a 2 story home. Drinks 1/2 cup of coffee a day, rare soda or tea. Exercise regularly for at least 60 minutes.    Vitals:   11/26/18 0738  BP: 130/64  Pulse: 80  Resp: 12  Temp: 97.7 F (36.5 C)  SpO2: 98%   Body mass index is 28.2 kg/m.   Physical Exam  Nursing note and vitals reviewed. Constitutional: She is oriented to person, place, and time. She appears well-developed. No distress.  HENT:  Head: Normocephalic and atraumatic.  Mouth/Throat: Oropharynx is clear and moist and mucous membranes  are normal.  Eyes: Conjunctivae are normal. Left pupil is not round and not reactive.  Cardiovascular: Normal rate and regular rhythm.  No murmur heard. Pulses:      Dorsalis pedis pulses are 2+ on the right side and 2+ on the left side.  Respiratory: Effort normal and breath sounds normal. No respiratory distress.  GI: Soft. She exhibits no mass. There is no hepatomegaly. There is no abdominal tenderness.  Musculoskeletal:        General: No edema.  Lymphadenopathy:    She has no cervical adenopathy.  Neurological: She is alert and oriented to person, place, and time. She has normal strength. No cranial nerve deficit. Gait normal.  Skin: Skin is warm. No rash noted. No erythema.  Psychiatric: She has a normal mood and affect.  Well groomed, good eye contact.    ASSESSMENT AND PLAN:  Ms. Ruhi was seen today for follow-up and medicare wellness.  Diagnoses and all orders for this visit:  Lab Results  Component Value Date   CHOL 193 11/26/2018   HDL 51.90 11/26/2018   LDLCALC 124 (H) 11/26/2018   TRIG 81.0 11/26/2018   CHOLHDL 4 11/26/2018   Lab Results  Component Value Date   ALT 13 11/26/2018   AST 19 11/26/2018   ALKPHOS 114 11/26/2018   BILITOT 0.6 11/26/2018   Lab Results  Component Value Date   CREATININE 0.93 11/26/2018   BUN 18 11/26/2018   NA 143 11/26/2018   K 4.0 11/26/2018   CL 106 11/26/2018   CO2 29 11/26/2018     Medicare annual wellness visit, subsequent We discussed the importance of staying active, physically and mentally, as well as the benefits of a healthy/balance diet. Low impact exercise that involve stretching and strengthing are ideal. Vaccines: Refused pneumovax vaccines. We discussed preventive screening for the next 5-10 years, summery of recommendations given in AVS. Per pt report gyn visit in 8.2020, up to date with mammogram and pap smear. HCV screening: 08/17/14. Colonoscopy in 11/2015. Fall prevention.  Advance directives and end of life discussed, she does not have POA or living will,strongly recommended ,information given in AVS.   Essential hypertension Adequate controlled. Continue Verapamil 120 gm daily and Enalapril 20 mg daily. Continue low salt diet. Monitor BP at home.  -     Comprehensive metabolic panel  Insomnia, unspecified type Good sleep hygiene. She does not need refills.  Tinnitus, right ear Educated about Dx. Referral placed as requested.  -     Ambulatory referral to Audiology  Hypercholesteremia No changes in current management, will follow labs done today and will give further recommendations accordingly.  -     Lipid panel  NASH (nonalcoholic steatohepatitis) LFT's has improved. We will continue following.  -     Comprehensive metabolic panel  Need for influenza vaccination -     Flu Vaccine QUAD 36+ mos IM   Return in about 6 months (around 05/26/2019).    -Ms. SUSANN LAWHORNE was advised to return sooner than planned today if new concerns arise.       Betty G. Martinique, MD  [facility]. [place] office.

## 2018-11-26 NOTE — Patient Instructions (Addendum)
  Latoya Thompson , Thank you for taking time to come for your Medicare Wellness Visit. I appreciate your ongoing commitment to your health goals. Please review the following plan we discussed and let me know if I can assist you in the future.   These are the goals we discussed: Goals   None     This is a list of the screening recommended for you and due dates:  Health Maintenance  Topic Date Due  . Flu Shot  10/12/2018  . Pap Smear  09/08/2019  . Mammogram  10/13/2020  . Tetanus Vaccine  02/08/2022  . Colon Cancer Screening  10/04/2025  .  Hepatitis C: One time screening is recommended by Center for Disease Control  (CDC) for  adults born from [birth year] through 1965.   Completed  . HIV Screening  Completed   A few things to remember from today's visit:   Essential hypertension - Plan: Comprehensive metabolic panel  Insomnia, unspecified type  Tinnitus, right ear - Plan: Ambulatory referral to Audiology  Hypercholesteremia - Plan: Lipid panel  NASH (nonalcoholic steatohepatitis) - Plan: Comprehensive metabolic panel   A few tips:  -As we age balance is not as good as it was, so there is a higher risks for falls. Please remove small rugs and furniture that is "in your way" and could increase the risk of falls. Stretching exercises may help with fall prevention: Yoga and Tai Chi are some examples. Low impact exercise is better, so you are not very achy the next day.  -Sun screen and avoidance of direct sun light recommended. Caution with dehydration, if working outdoors be sure to drink enough fluids.  - Some medications are not safe as we age, increases the risk of side effects and can potentially interact with other medication you are also taken;  including some of over the counter medications. Be sure to let me know when you start a new medication even if it is a dietary/vitamin supplement.   -Healthy diet low in red meet/animal fat and sugar + regular physical activity is  recommended.    Screening schedule for the next 5-10 years:  Colonoscopy every 10 years until age 38. Glaucoma screening/eye exam as recommended by your ophthalmologist. Continue having Pap smears with your gynecologist. Mammogram for breast cancer screening annually. Flu vaccine annually. Diabetes screening  Fall prevention   Advance directives:  Please see a lawyer and/or go to this website to help you with advanced directives and designating a health care power of attorney so that your wishes will be followed should you become too ill to make your own medical decisions.  [email or web address]

## 2018-11-28 ENCOUNTER — Encounter: Payer: Self-pay | Admitting: Family Medicine

## 2018-12-03 NOTE — Telephone Encounter (Signed)
I called the pt informed her that   I faxed referral / notes to  [facility] Address: [address] [place], [place] [address] Phone: [phone] Fax [phone]  . I called [facility] was told   That they have reached out to the pt 3 times and left messages to return their call to schedule . I informed the pt of this and provided her with their phone number    Copied from [place] [phone]. Topic: General - Other >> Dec 03, 2018 10:40 AM Leward Quan A wrote: Reason for CRM: Patient called to inquire of Dr Martinique how was she going to handle her getting the hearing test that was discussed at last visit. Per patient she have an appointment with the ENT on 12/09/2018 at 1 pm and need to know about the hearing test because its needed before this visit. Say that test is not done at this particular ENT office or was there going to be a new referral. Please call Ph# [phone] >> Dec 03, 2018 10:46 AM Cox, Melburn Hake, CMA wrote: Referral to [facility] closed but pt has not been seen yet. Please contact pt.

## 2018-12-04 ENCOUNTER — Encounter: Payer: Self-pay | Admitting: Family Medicine

## 2018-12-09 DIAGNOSIS — H93293 Other abnormal auditory perceptions, bilateral: Secondary | ICD-10-CM | POA: Diagnosis not present

## 2018-12-09 DIAGNOSIS — H9311 Tinnitus, right ear: Secondary | ICD-10-CM | POA: Diagnosis not present

## 2018-12-15 ENCOUNTER — Other Ambulatory Visit: Payer: Self-pay | Admitting: Family Medicine

## 2018-12-15 DIAGNOSIS — I10 Essential (primary) hypertension: Secondary | ICD-10-CM

## 2018-12-18 DIAGNOSIS — H9313 Tinnitus, bilateral: Secondary | ICD-10-CM | POA: Diagnosis not present

## 2018-12-26 DIAGNOSIS — H18603 Keratoconus, unspecified, bilateral: Secondary | ICD-10-CM | POA: Diagnosis not present

## 2018-12-26 DIAGNOSIS — Z947 Corneal transplant status: Secondary | ICD-10-CM | POA: Diagnosis not present

## 2019-01-26 ENCOUNTER — Other Ambulatory Visit: Payer: Self-pay | Admitting: Family Medicine

## 2019-02-12 ENCOUNTER — Other Ambulatory Visit: Payer: Self-pay | Admitting: Family Medicine

## 2019-02-12 DIAGNOSIS — G4700 Insomnia, unspecified: Secondary | ICD-10-CM

## 2019-02-13 ENCOUNTER — Other Ambulatory Visit: Payer: Self-pay | Admitting: Family Medicine

## 2019-03-16 ENCOUNTER — Other Ambulatory Visit: Payer: Self-pay | Admitting: Family Medicine

## 2019-03-16 DIAGNOSIS — I10 Essential (primary) hypertension: Secondary | ICD-10-CM

## 2019-04-15 NOTE — Progress Notes (Signed)
Virtual Visit via Video Note   This visit type was conducted due to national recommendations for restrictions regarding the COVID-19 Pandemic (e.g. social distancing) in an effort to limit this patient's exposure and mitigate transmission in our community.  Due to her co-morbid illnesses, this patient is at least at moderate risk for complications without adequate follow up.  This format is felt to be most appropriate for this patient at this time.  All issues noted in this document were discussed and addressed.  A limited physical exam was performed with this format.  Please refer to the patient's chart for her consent to telehealth for [facility].   Date:  04/24/2019   ID:  Latoya Thompson, DOB [birth year], MRN [id number]  Patient Location: Home Provider Location: Office  PCP:  Martinique, Betty G, MD  Cardiologist:   Johnsie Cancel Electrophysiologist:  None   Evaluation Performed:  Follow-Up Visit  History of Present Illness:    59 y.o. last seen by PA 05/01/16 History of blindness, LBBB, HTN, HLD DM-2 and mild DCM with EF 45-50% by echo 05/01/16 stable since echo 2015.  She had myovue 07/22/13 which was non ischemic She complained of dyspnea but BNP was normal at 24.  Carotid duplex done for tinnitus showed plaque no stenosis 01/01/18 She is on disability due to her blindness.  Also with some left sided tremors   No cardiac complaints Compliant with meds   The patient  does not have symptoms concerning for COVID-19 infection (fever, chills, cough, or new shortness of breath).    Past Medical History:  Diagnosis Date  . Chicken pox   . Hypercholesteremia   . Hypertension   . Keratoconus    Left   Past Surgical History:  Procedure Laterality Date  . BREAST SURGERY     biopsy  . CORNEAL TRANSPLANT       Current Meds  Medication Sig  . aspirin EC 81 MG tablet Take 81 mg by mouth daily.  . enalapril (VASOTEC) 20 MG tablet Take 1 tablet by mouth once daily  . lovastatin (MEVACOR) 40  MG tablet TAKE 1 TABLET BY MOUTH AT BEDTIME  . montelukast (SINGULAIR) 10 MG tablet TAKE 1 TABLET BY MOUTH AT BEDTIME  . pantoprazole (PROTONIX) 20 MG tablet Take 1 tablet (20 mg total) by mouth daily.  . sodium fluoride (FLUORISHIELD) 1.1 % GEL dental gel SF 5000 Plus 1.1 % dental cream  BRUSH THOROUGHLY ONCE DAILY AT BEDTIME  . verapamil (CALAN) 120 MG tablet Take 1 tablet by mouth once daily  . vitamin C (ASCORBIC ACID) 500 MG tablet Take 500 mg by mouth daily.  . vitamin E 400 UNIT capsule Take 400 Units by mouth daily.  Marland Kitchen zolpidem (AMBIEN) 5 MG tablet TAKE 1/2 TO 1 (ONE-HALF TO ONE) TABLET BY MOUTH AT BEDTIME AS NEEDED FOR SLEEP     Allergies:   Codeine   Social History   Tobacco Use  . Smoking status: Never Smoker  . Smokeless tobacco: Never Used  Substance Use Topics  . Alcohol use: No  . Drug use: No     Family Hx: The patient's family history includes Diabetes in her father and mother; Heart disease in her father and mother; Hyperlipidemia in her father and mother; Hypertension in her father and mother; Stroke in her father and mother.  ROS:   Please see the history of present illness.     All other systems reviewed and are negative.   Prior CV studies:  The following studies were reviewed today:  Carotid Duplex 12/27/17 Echo: 05/01/16 Myovue 07/22/13   Labs/Other Tests and Data Reviewed:    EKG:  07/14/16 SR LBBB   Recent Labs: 11/26/2018: ALT 13; BUN 18; Creatinine, Ser 0.93; Potassium 4.0; Sodium 143   Recent Lipid Panel Lab Results  Component Value Date/Time   CHOL 193 11/26/2018 08:22 AM   TRIG 81.0 11/26/2018 08:22 AM   HDL 51.90 11/26/2018 08:22 AM   CHOLHDL 4 11/26/2018 08:22 AM   LDLCALC 124 (H) 11/26/2018 08:22 AM    Wt Readings from Last 3 Encounters:  04/24/19 154 lb (69.9 kg)  11/26/18 159 lb 3.2 oz (72.2 kg)  01/02/18 159 lb (72.1 kg)     Objective:    Vital Signs:  BP 136/90   Pulse 79   Ht 5' 3"  (1.6 m)   Wt 154 lb (69.9 kg)    BMI 27.28 kg/m    Skin warm and dry No distress No tachypnea No JVP elevation  Neuro appears non focal No edema Blind      ASSESSMENT & PLAN:    1. DCM:  Stable EF over years 45-50% f/u echo in a year  2. LBBB: chronic f/u ECG next in person visit  3. HTN:  Well controlled continue vasotec 4. HLD:  LDL was 115 01/04/18 needs f/u labs with primary on mevacor  5. Tinnitus :  F/u with primary and ENT as well as her blindness and tremors Suggested She get at least a CT head and possibly MRI  COVID-19 Education: The signs and symptoms of COVID-19 were discussed with the patient and how to seek care for testing (follow up with PCP or arrange E-visit).  The importance of social distancing was discussed today.  Time:   Today, I have spent 30 minutes with the patient with telehealth technology discussing the above problems.     Medication Adjustments/Labs and Tests Ordered: Current medicines are reviewed at length with the patient today.  Concerns regarding medicines are outlined above.   Tests Ordered:  Echo in a year for DCM  Medication Changes:  None   Disposition:  Follow up in a year  Signed, Jenkins Rouge, MD  04/24/2019 8:43 AM    [place]

## 2019-04-24 ENCOUNTER — Telehealth (INDEPENDENT_AMBULATORY_CARE_PROVIDER_SITE_OTHER): Payer: PPO | Admitting: Cardiovascular Disease

## 2019-04-24 ENCOUNTER — Encounter: Payer: Self-pay | Admitting: Cardiovascular Disease

## 2019-04-24 VITALS — BP 136/90 | HR 79 | Ht 63.0 in | Wt 154.0 lb

## 2019-04-24 DIAGNOSIS — I420 Dilated cardiomyopathy: Secondary | ICD-10-CM | POA: Diagnosis not present

## 2019-04-24 NOTE — Addendum Note (Signed)
Addended by: Aris Georgia, Ysidro Ramsay L on: 04/24/2019 08:53 AM   Modules accepted: Orders

## 2019-04-24 NOTE — Patient Instructions (Addendum)
Medication Instructions:  *If you need a refill on your cardiac medications before your next appointment, please call your pharmacy*  Lab Work: If you have labs (blood work) drawn today and your tests are completely normal, you will receive your results only by: Marland Kitchen MyChart Message (if you have MyChart) OR . A paper copy in the mail If you have any lab test that is abnormal or we need to change your treatment, we will call you to review the results.  Test Your physician has requested that you have an echocardiogram in one year. Echocardiography is a painless test that uses sound waves to create images of your heart. It provides your doctor with information about the size and shape of your heart and how well your heart's chambers and valves are working. This procedure takes approximately one hour. There are no restrictions for this procedure.   Follow-Up: At [facility], you and your health needs are our priority.  As part of our continuing mission to provide you with exceptional heart care, we have created designated Provider Care Teams.  These Care Teams include your primary Cardiologist (physician) and Advanced Practice Providers (APPs -  Physician Assistants and Nurse Practitioners) who all work together to provide you with the care you need, when you need it.  Your next appointment:   12 month(s)  The format for your next appointment:   In Person  Provider:   You may see Dr. Johnsie Cancel or one of the following Advanced Practice Providers on your designated Care Team:    Truitt Merle, NP  Cecilie Kicks, NP  Kathyrn Drown, NP

## 2019-04-27 ENCOUNTER — Other Ambulatory Visit: Payer: Self-pay | Admitting: Family Medicine

## 2019-05-18 ENCOUNTER — Encounter: Payer: Self-pay | Admitting: Family Medicine

## 2019-05-22 ENCOUNTER — Ambulatory Visit: Payer: PPO | Attending: Internal Medicine

## 2019-05-22 DIAGNOSIS — Z23 Encounter for immunization: Secondary | ICD-10-CM

## 2019-05-22 NOTE — Progress Notes (Signed)
   Covid-19 Vaccination Clinic  Name:  Latoya Thompson    MRN: [id number] DOB: 1960-05-05  05/22/2019  Latoya Thompson was observed post Covid-19 immunization for 15 minutes without incident. She was provided with Vaccine Information Sheet and instruction to access the V-Safe system.   Latoya Thompson was instructed to call 911 with any severe reactions post vaccine: Marland Kitchen Difficulty breathing  . Swelling of face and throat  . A fast heartbeat  . A bad rash all over body  . Dizziness and weakness   Immunizations Administered    Name Date Dose VIS Date Route   Pfizer COVID-19 Vaccine 05/22/2019  9:42 AM 0.3 mL 02/21/2019 Intramuscular   Manufacturer: Roff   Lot: [id number]   [place]: [id number]

## 2019-05-23 ENCOUNTER — Other Ambulatory Visit: Payer: Self-pay

## 2019-05-26 ENCOUNTER — Encounter: Payer: Self-pay | Admitting: Family Medicine

## 2019-05-26 ENCOUNTER — Ambulatory Visit (INDEPENDENT_AMBULATORY_CARE_PROVIDER_SITE_OTHER): Payer: PPO | Admitting: Family Medicine

## 2019-05-26 ENCOUNTER — Other Ambulatory Visit: Payer: Self-pay

## 2019-05-26 VITALS — BP 126/80 | HR 80 | Temp 95.3°F | Resp 12 | Ht 63.0 in | Wt 158.0 lb

## 2019-05-26 DIAGNOSIS — K7581 Nonalcoholic steatohepatitis (NASH): Secondary | ICD-10-CM | POA: Diagnosis not present

## 2019-05-26 DIAGNOSIS — I10 Essential (primary) hypertension: Secondary | ICD-10-CM | POA: Diagnosis not present

## 2019-05-26 DIAGNOSIS — G4700 Insomnia, unspecified: Secondary | ICD-10-CM

## 2019-05-26 DIAGNOSIS — Z7189 Other specified counseling: Secondary | ICD-10-CM | POA: Diagnosis not present

## 2019-05-26 DIAGNOSIS — R42 Dizziness and giddiness: Secondary | ICD-10-CM | POA: Diagnosis not present

## 2019-05-26 DIAGNOSIS — I429 Cardiomyopathy, unspecified: Secondary | ICD-10-CM | POA: Diagnosis not present

## 2019-05-26 LAB — COMPREHENSIVE METABOLIC PANEL
ALT: 15 U/L (ref 0–35)
AST: 22 U/L (ref 0–37)
Albumin: 4.3 g/dL (ref 3.5–5.2)
Alkaline Phosphatase: 121 U/L — ABNORMAL HIGH (ref 39–117)
BUN: 16 mg/dL (ref 6–23)
CO2: 26 mEq/L (ref 19–32)
Calcium: 9.7 mg/dL (ref 8.4–10.5)
Chloride: 106 mEq/L (ref 96–112)
Creatinine, Ser: 1.05 mg/dL (ref 0.40–1.20)
GFR: 64.91 mL/min (ref >60.00)
Glucose, Bld: 89 mg/dL (ref 70–99)
Potassium: 3.8 mEq/L (ref 3.5–5.1)
Sodium: 142 mEq/L (ref 135–145)
Total Bilirubin: 0.5 mg/dL (ref 0.2–1.2)
Total Protein: 7.3 g/dL (ref 6.0–8.3)

## 2019-05-26 NOTE — Patient Instructions (Addendum)
A few things to remember from today's visit:   Essential hypertension - Plan: Comprehensive metabolic panel  Insomnia, unspecified type  NASH (nonalcoholic steatohepatitis) - Plan: Comprehensive metabolic panel  No changes today. I will see you in 11/2017.  Please be sure medication list is accurate. If a new problem present, please set up appointment sooner than planned today.

## 2019-05-26 NOTE — Progress Notes (Signed)
HPI:   [name] is a 59 y.o. female, who is here today for 6 months follow up.   She was last seen on 11/26/19. No new problems since her last OV.  NASH/abnormal LFT's:  LFT's has improved. She has not noted abdominal pain,N/V,color changes in stool/urine,or jaundice. No alcohol intake.  Lab Results  Component Value Date   ALT 13 11/26/2018   AST 19 11/26/2018   ALKPHOS 114 11/26/2018   BILITOT 0.6 11/26/2018   Insomnia: She does not take Ambien 5 mg frequently. In generalSleeps about 5-6 hours longer during weekends.  She is exercising regularly: Cardio,walking, and lifting wts a few times per week.   She has a few questions about COVID 19 vaccination. Her husband has kidney transplant and wonders how safe vaccination is for him.  HTN: Negative for severe/frequent headache, visual changes, chest pain, dyspnea, palpitation, claudication, focal weakness, or edema.  She is on Enalapril 20 mg daily and Verapamil 120 mg daily.  She has seen her cardiologist last month. Hx of cardiomyopathy. Pending echo. Negative for orthopnea,PND.  Component     Latest Ref Rng & Units 11/26/2018  Sodium     135 - 145 mEq/L 143  Potassium     3.5 - 5.1 mEq/L 4.0  Chloride     96 - 112 mEq/L 106  CO2     19 - 32 mEq/L 29  Glucose     70 - 99 mg/dL 80  BUN     6 - 23 mg/dL 18  Creatinine     0.40 - 1.20 mg/dL 0.93  Total Bilirubin     0.2 - 1.2 mg/dL 0.6  Alkaline Phosphatase     39 - 117 U/L 114   For a while occasionally she has dizziness when lying down, movement sensation,lastes about 30 seconds.  Review of Systems  Constitutional: Negative for activity change, appetite change, fatigue and fever.  HENT: Negative for mouth sores, nosebleeds and sore throat.   Respiratory: Negative for cough and wheezing.   Cardiovascular: Negative for palpitations.  Gastrointestinal:       Negative for changes in bowel habits.  Genitourinary: Negative for decreased  urine volume and hematuria.  Musculoskeletal: Negative for gait problem and myalgias.  Neurological: Negative for syncope, facial asymmetry and speech difficulty.  Rest of ROS, see pertinent positives sand negatives in HPI  Current Outpatient Medications on File Prior to Visit  Medication Sig Dispense Refill  . aspirin EC 81 MG tablet Take 81 mg by mouth daily.    . enalapril (VASOTEC) 20 MG tablet Take 1 tablet by mouth once daily 90 tablet 0  . lovastatin (MEVACOR) 40 MG tablet TAKE 1 TABLET BY MOUTH AT BEDTIME 90 tablet 0  . montelukast (SINGULAIR) 10 MG tablet TAKE 1 TABLET BY MOUTH AT BEDTIME 90 tablet 1  . pantoprazole (PROTONIX) 20 MG tablet Take 1 tablet (20 mg total) by mouth daily. 180 tablet 1  . sodium fluoride (FLUORISHIELD) 1.1 % GEL dental gel SF 5000 Plus 1.1 % dental cream  BRUSH THOROUGHLY ONCE DAILY AT BEDTIME    . verapamil (CALAN) 120 MG tablet Take 1 tablet by mouth once daily 90 tablet 0  . vitamin C (ASCORBIC ACID) 500 MG tablet Take 500 mg by mouth daily.    . vitamin E 400 UNIT capsule Take 400 Units by mouth daily.    Marland Kitchen zolpidem (AMBIEN) 5 MG tablet TAKE 1/2 TO 1 (ONE-HALF TO ONE) TABLET  BY MOUTH AT BEDTIME AS NEEDED FOR SLEEP 15 tablet 1   No current facility-administered medications on file prior to visit.     Past Medical History:  Diagnosis Date  . Chicken pox   . Hypercholesteremia   . Hypertension   . Keratoconus    Left   Allergies  Allergen Reactions  . Codeine Itching    Social History   Socioeconomic History  . Marital status: Married    Spouse name: Latoya Thompson  . Number of children: 0  . Years of education: Not on file  . Highest education level: Bachelor's degree (e.g., BA, AB, BS)  Occupational History  . Occupation: Educator  Tobacco Use  . Smoking status: Never Smoker  . Smokeless tobacco: Never Used  Substance and Sexual Activity  . Alcohol use: No  . Drug use: No  . Sexual activity: Not on file  Other Topics Concern  . Not on  file  Social History Narrative   Married, lives with husband Latoya Thompson in a 2 story home. Drinks 1/2 cup of coffee a day, rare soda or tea. Exercise regularly for at least 60 minutes.   Social Determinants of Health   Financial Resource Strain:   . Difficulty of Paying Living Expenses:   Food Insecurity:   . Worried About Charity fundraiser in the Last Year:   . Arboriculturist in the Last Year:   Transportation Needs:   . Film/video editor (Medical):   Marland Kitchen Lack of Transportation (Non-Medical):   Physical Activity:   . Days of Exercise per Week:   . Minutes of Exercise per Session:   Stress:   . Feeling of Stress :   Social Connections:   . Frequency of Communication with Friends and Family:   . Frequency of Social Gatherings with Friends and Family:   . Attends Religious Services:   . Active Member of Clubs or Organizations:   . Attends Archivist Meetings:   Marland Kitchen Marital Status:    Vitals:   05/26/19 0731  BP: 126/80  Pulse: 80  Resp: 12  Temp: (!) 95.3 F (35.2 C)  SpO2: 97%   Wt Readings from Last 3 Encounters:  05/26/19 158 lb (71.7 kg)  04/24/19 154 lb (69.9 kg)  11/26/18 159 lb 3.2 oz (72.2 kg)   Body mass index is 27.99 kg/m.  Physical Exam  Nursing note and vitals reviewed. Constitutional: She is oriented to person, place, and time. She appears well-developed. No distress.  HENT:  Head: Normocephalic and atraumatic.  Mouth/Throat: Oropharynx is clear and moist and mucous membranes are normal.  Eyes: Conjunctivae are normal. Left pupil is not reactive. Pupils are unequal.  Cardiovascular: Normal rate and regular rhythm.  No murmur heard. Pulses:      Dorsalis pedis pulses are 2+ on the right side and 2+ on the left side.  Respiratory: Effort normal and breath sounds normal. No respiratory distress.  GI: Soft. She exhibits no mass. There is no hepatomegaly. There is no abdominal tenderness.  Musculoskeletal:        General: No edema.   Lymphadenopathy:    She has no cervical adenopathy.  Neurological: She is alert and oriented to person, place, and time. She has normal strength. No cranial nerve deficit. Gait normal.  Skin: Skin is warm. No rash noted. No erythema.  Psychiatric: She has a normal mood and affect.  Well groomed, good eye contact.    ASSESSMENT AND PLAN:   [name] was seen today for 6 months follow-up.  Orders Placed This Encounter  Procedures  . Comprehensive metabolic panel   Lab Results  Component Value Date   CREATININE 1.05 05/26/2019   BUN 16 05/26/2019   NA 142 05/26/2019   K 3.8 05/26/2019   CL 106 05/26/2019   CO2 26 05/26/2019   Lab Results  Component Value Date   ALT 15 05/26/2019   AST 22 05/26/2019   ALKPHOS 121 (H) 05/26/2019   BILITOT 0.5 05/26/2019    Hypertension BP adequately controlled. No changes in current management. Continue monitoring BP regularly and following a low salt diet. Eye exam is current.  NASH (nonalcoholic steatohepatitis) Last LFT's in normal range. Continue Vit E, no changes in Lovastatin dose.  Insomnia Taking Ambien occasionally. Good sleep hygiene.  Educated about COVID-19 virus infection We discussed presentation,prognosis,and treatment. Also answered questions in regard to COVID 19 vaccine.  Dizziness Hx suggest vertigo. Fall precautions. Monitor for changes in fe=requacy or intensity.  Cardiomyopathy, unspecified type ([place]) Pending echo. Following with cardiologist.  Return in about 6 months (around 12/03/2019) for CPE and f/u.   Vasily Fedewa G. Martinique, MD  [facility]. [place] office.

## 2019-05-26 NOTE — Assessment & Plan Note (Signed)
Taking Ambien occasionally. Good sleep hygiene.

## 2019-05-26 NOTE — Assessment & Plan Note (Signed)
Last LFT's in normal range. Continue Vit E, no changes in Lovastatin dose.

## 2019-05-26 NOTE — Assessment & Plan Note (Signed)
BP adequately controlled. No changes in current management. Continue monitoring BP regularly and following a low salt diet. Eye exam is current.

## 2019-05-29 ENCOUNTER — Encounter: Payer: Self-pay | Admitting: Family Medicine

## 2019-05-29 DIAGNOSIS — I429 Cardiomyopathy, unspecified: Secondary | ICD-10-CM | POA: Insufficient documentation

## 2019-06-05 ENCOUNTER — Telehealth: Payer: Self-pay | Admitting: Family Medicine

## 2019-06-05 NOTE — Progress Notes (Signed)
  Chronic Care Management   Note  06/05/2019 Name: DARA BEIDLEMAN MRN: [id number] DOB: 12/17/1960  [place] Coiro is a 59 y.o. year old female who is a primary care patient of Martinique, Malka So, MD. I reached out to [name] by phone today in response to a referral sent by Ms. Arville Go A Rother's PCP, Martinique, Betty G, MD.   Ms. Fiebelkorn was given information about Chronic Care Management services today including:  1. CCM service includes personalized support from designated clinical staff supervised by her physician, including individualized plan of care and coordination with other care providers 2. 24/7 contact phone numbers for assistance for urgent and routine care needs. 3. Service will only be billed when office clinical staff spend 20 minutes or more in a month to coordinate care. 4. Only one practitioner may furnish and bill the service in a calendar month. 5. The patient may stop CCM services at any time (effective at the end of the month) by phone call to the office staff.   Patient agreed to services and verbal consent obtained.   Follow up plan:   Raynicia Dukes [employer] Scheduler

## 2019-06-16 ENCOUNTER — Other Ambulatory Visit: Payer: Self-pay | Admitting: Family Medicine

## 2019-06-16 ENCOUNTER — Ambulatory Visit: Payer: PPO | Attending: Internal Medicine

## 2019-06-16 DIAGNOSIS — Z23 Encounter for immunization: Secondary | ICD-10-CM

## 2019-06-16 DIAGNOSIS — I10 Essential (primary) hypertension: Secondary | ICD-10-CM

## 2019-06-16 NOTE — Progress Notes (Signed)
   Covid-19 Vaccination Clinic  Name:  Latoya Thompson    MRN: [id number] DOB: [birth year]  06/16/2019  Latoya Thompson was observed post Covid-19 immunization for 15 minutes without incident. She was provided with Vaccine Information Sheet and instruction to access the V-Safe system.   Latoya Thompson was instructed to call 911 with any severe reactions post vaccine: Marland Kitchen Difficulty breathing  . Swelling of face and throat  . A fast heartbeat  . A bad rash all over body  . Dizziness and weakness   Immunizations Administered    Name Date Dose VIS Date Route   Pfizer COVID-19 Vaccine 06/16/2019 10:03 AM 0.3 mL 02/21/2019 Intramuscular   Manufacturer: [place]   Lot: [id number]   [place]: [id number]

## 2019-06-26 DIAGNOSIS — Z947 Corneal transplant status: Secondary | ICD-10-CM | POA: Diagnosis not present

## 2019-06-26 DIAGNOSIS — H18603 Keratoconus, unspecified, bilateral: Secondary | ICD-10-CM | POA: Diagnosis not present

## 2019-06-27 ENCOUNTER — Other Ambulatory Visit: Payer: Self-pay | Admitting: *Deleted

## 2019-06-27 DIAGNOSIS — E7800 Pure hypercholesterolemia, unspecified: Secondary | ICD-10-CM

## 2019-06-27 DIAGNOSIS — I10 Essential (primary) hypertension: Secondary | ICD-10-CM

## 2019-06-27 NOTE — Telephone Encounter (Signed)
Referral placed as requested.

## 2019-06-30 ENCOUNTER — Telehealth: Payer: PPO

## 2019-06-30 NOTE — Chronic Care Management (AMB) (Signed)
.  Chronic Care Management Pharmacy  Name: Latoya Thompson  MRN: [id number] DOB: 1960-08-11  Initial Questions: 1. Have you seen any other providers since your last visit? NA 2. Any changes in your medicines or health? No   Chief Complaint/ HPI Latoya Thompson,  59 y.o. , female presents for their Initial CCM visit with the clinical pharmacist via telephone due to COVID-19 Pandemic  PCP : Martinique, Betty G, MD  Their chronic conditions include: HTN, cardiomyopathy, GERD, hypercholesteremia, insomnia, NASH   Office Visits: 05/26/19 - Patient presented for office visit with Dr. Betty Martinique, MD for 6 months f/u visit. No changes with current medications. Pending echo and following with cardiologist. Patient to return in 6 months for CPE and follow up.   Consult Visit: 04/24/19 - Cardiology - Patient presented for virtual visit with Dr. Jenkins Rouge, MD for follow up visit. Patient has had a stable EF over years 45-50%. Patient to obtain follow up ECHO in a year. Patient to obtain ECG at next in person visit. Patient to continue enalapril for HTN. Patient needs follow up lipid panel with PCP.   Medications: Outpatient Encounter Medications as of 07/01/2019  Medication Sig  . aspirin EC 81 MG tablet Take 81 mg by mouth daily.  . enalapril (VASOTEC) 20 MG tablet Take 1 tablet by mouth once daily  . lovastatin (MEVACOR) 40 MG tablet TAKE 1 TABLET BY MOUTH AT BEDTIME  . montelukast (SINGULAIR) 10 MG tablet TAKE 1 TABLET BY MOUTH AT BEDTIME  . pantoprazole (PROTONIX) 20 MG tablet Take 1 tablet (20 mg total) by mouth daily. (Patient taking differently: Take 20 mg by mouth 3 (three) times a week. )  . sodium fluoride (FLUORISHIELD) 1.1 % GEL dental gel SF 5000 Plus 1.1 % dental cream  BRUSH THOROUGHLY ONCE DAILY AT BEDTIME  . verapamil (CALAN) 120 MG tablet Take 1 tablet by mouth once daily  . vitamin C (ASCORBIC ACID) 500 MG tablet Take 500 mg by mouth daily.  . vitamin E 400 UNIT capsule Take 400  Units by mouth daily.  Marland Kitchen zolpidem (AMBIEN) 5 MG tablet TAKE 1/2 TO 1 (ONE-HALF TO ONE) TABLET BY MOUTH AT BEDTIME AS NEEDED FOR SLEEP   No facility-administered encounter medications on file as of 07/01/2019.   Current Diagnosis/Assessment: Goals Addressed            This Visit's Progress   . Pharmacy Care Plan       CARE PLAN ENTRY  Current Barriers:  . Chronic Disease Management support, education, and care coordination needs related to HTN, HLD, and Cardiomyopathy, GERD, Insomnia, NASH  Controlled hypertension   Current antihypertensive regimen: Enalapril 20 mg 1 tablet daily  and Verapamil 120 mg 1 tablet daily . Previous antihypertensives tried: none  . Last practice recorded BP readings:  BP Readings from Last 3 Encounters:  05/26/19 126/80  04/24/19 136/90  11/26/18 130/64 .  Current home BP readings: 124-128/ 80-85 mmHg.  Uncontrolled hyperlipidemia . Current antihyperlipidemic regimen: Lovastatin 40 mg 1 tablet at bedtime . Previous antihyperlipidemic medications tried none  . Most recent lipid panel:     Component Value Date/Time   CHOL 193 11/26/2018 0822   TRIG 81.0 11/26/2018 0822   HDL 51.90 11/26/2018 0822   CHOLHDL 4 11/26/2018 0822   VLDL 16.2 11/26/2018 0822   LDLCALC 124 (H) 11/26/2018 0822  The 10-year ASCVD risk score Mikey Bussing DC Jr., et al., 2013) is: 6.1% Acid reflux / GERD . Current regimen: pantoprazole  35m, 1 tablet daily (currently taking 3 times per week).  Insomnia . Current regimen: Zolpidem 5 mg, 0.5 to 1 tablet at bedtime as needed for sleep NASH (non-alcoholic steatohepatitis)  . Current regimen: Vitamin E 400 units 1 capsule daily  Allergy rhinitis . Current regimen: Montelukast 10 mg 1 tablet at bedtime  Pharmacist Clinical Goal(s):  .Marland KitchenOver the next 14 days, patient will work with PharmD and providers to optimize allergy rhinitis regimen.  . High blood pressure:  .Marland KitchenMaintain Blood pressure <130/80 mmHg  . High cholesterol:   . Cholesterol goals: Total Cholesterol goal under 200, Triglycerides goal under 150, HDL goal above 40 (men) or above 50 (women), LDL goal under 100.   Interventions: . Comprehensive medication review performed; medication list updated in electronic medical record.  .[facility] team collaboration  . High blood pressure:  . Discussed diet modifications. DASH diet:  following a diet emphasizing fruits and vegetables and low-fat dairy products along with whole grains, fish, poultry, and nuts. Reducing red meats and sugars.  . Exercising . Reducing the amount of salt intake to <15047mper day.  . Recommend using a salt substitute to replace your salt if you need flavor.      . Weight reduction- We discussed losing 5-10% of body weight . High cholesterol . How to reduce cholesterol through diet/weight management and physical activity. . We discussed how a diet high in plant sterols (fruits/vegetables/nuts/whole grains/legumes) may reduce your cholesterol. Encouraged increasing fiber to a daily intake of 10-25g/day  . Acid reflux . We discussed non-pharmacological interventions for acid reflux. Take measures to prevent acid reflux, such as avoiding spicy foods, avoiding caffeine, avoid laying down a few hours after eating, and raising the head of the bed . Allergies . Consulted with Dr. JoMartiniquePatient to take montelukast 1087muring allergy season (Can stop when not needed).  Patient Self Care Activities:  . Patient will continue to check BP daily , document, and provide at future appointments . Continue current medications as directed by providers.  . Continue following up with primary care provider and/or specialists. . Continue at home blood pressure readings. . Continue working on healthy habits (diet/ exercise).  Initial goal documentation       SDOH Interventions     Most Recent Value  SDOH Interventions  SDOH Interventions for the Following Domains  Financial  Strain, Transportation  Financial Strain Interventions  Other (Comment) [Not needed.]  Transportation Interventions  Other (Comment) [Not needed.]       Hypertension  BP today is:  <130/80  Office blood pressures are  BP Readings from Last 3 Encounters:  05/26/19 126/80  04/24/19 136/90  11/26/18 130/64   Current medications  Enalapril 20 mg 1 tablet daily   Verapamil 120 mg 1 tablet daily  Patient has failed these meds in the past: none  Patient checks BP at home: daily   Patient home BP readings are ranging: 124-128/80-85 mmHg.   We discussed: diet and exercise extensively.  . Discussed diet modifications. DASH diet:  following a diet emphasizing fruits and vegetables and low-fat dairy products along with whole grains, fish, poultry, and nuts. Reducing red meats and sugars.  - Patient reports eating red meats: once or twice a week. She and her spouse eat more poultry than red meats (she reports spouse had a transplant and they are being careful on what they eat). . Exercising - Patient reports exercising every morning at 5am.  - Exercise consists of: walking/  cardio/ weights/ Zumba.  - Before pandemic, patient reports going to gym every 5 days/ week.   . Reducing the amount of salt intake to <1564m/per day.  . Recommend using a salt substitute to replace your salt if you need flavor.  . Weight reduction- We discussed losing 5-10% of body weight  Plan Denies persistent dizziness/ lightheadedness.  Managed by cardiologist (Dr. PJenkins Thompson.  Continue current medications and control with diet and exercise.    Cardimyopathy   Ejection Fraction: 45-50% (05/01/2016)  Patient has failed these meds in past: none Patient is currently managed on the following medications:  Enalapril 20 mg 1 tablet daily   Plan Managed by cardiologist (Dr. PJenkins Thompson.  Continue current medications.   Hyperlipidemia   Lipid Panel     Component Value Date/Time   CHOL 193  11/26/2018 0822   TRIG 81.0 11/26/2018 0822   HDL 51.90 11/26/2018 0822   CHOLHDL 4 11/26/2018 0822   VLDL 16.2 11/26/2018 0822   LDLCALC 124 (H) 11/26/2018 0822    The 10-year ASCVD risk score (Mikey BussingDC Jr., et al., 2013) is: 6.1%   Values used to calculate the score:     Age: [age]years     Sex: Female     Is Non-Hispanic African American: Yes     Diabetic: No     Tobacco smoker: No     Systolic Blood Pressure: 1034mmHg     Is BP treated: Yes     HDL Cholesterol: 51.9 mg/dL     Total Cholesterol: 193 mg/dL   Patient has failed these meds in past: none Patient is currently controlled on the following medications:  Lovastatin 40 mg 1 tablet at bedtime  We discussed:  diet and exercise extensively  . How to reduce cholesterol through diet/weight management and physical activity. . We discussed how a diet high in plant sterols (fruits/vegetables/nuts/whole grains/legumes) may reduce your cholesterol.  Encouraged increasing fiber to a daily intake of 10-25g/day   Plan Continue current medications.    Primary prevention: Aspirin therapy  Patient is currently on the following medications:   Aspirin EC 81 mg tablet daily   We discussed: - patient reports due to family history of DM, HTN, and stroke, her cardiologist put on this a couple of years back for prevention.   Plan Continue current medications.   GERD/ acid reflux   Patient has failed these meds in past: none Patient is currently controlled on the following medications:  Pantoprazole 20 mg 1 tablet daily  (patient reports taking 3 times weekly for about a year and a half now since she has not have GERD sx).   We discussed:  Discussed non-pharmacological interventions for acid reflux. Take measures to prevent acid reflux, such as avoiding spicy foods, avoiding caffeine, avoid laying down a few hours after eating, and raising the head of the bed.   - Patient reported her triggers: spaghetti sauce.  - Discussed long  term use of PPIs and risks. Discussed further spacing out doses and discontinuing if not needed. Per previous note from Dr. JMartinique(07/03/2018), patient was instructed that she can continue Protonix 263mdaily as needed.  So if no need, she can attempt seeing how she does without it.   Plan Continue current medications  Insomnia  Patient has failed these meds in past: none Patient is currently controlled on the following medications:  Zolpidem 5 mg, 0.5 to 1 tablet at bedtime as needed for sleep (rarely takes it- once  every 2 weeks or 3 weeks)   We discussed:  - patient reported she rarely takes it- about 1 tablet every 2 to 3 weeks   Plan Continue current medications.    NASH  Patient is currently controlled on the following medications:   Vitamin E 400 units 1 capsule daily   Hepatic Function Latest Ref Rng & Units 05/26/2019 11/26/2018 01/04/2018  Total Protein 6.0 - 8.3 Thompson/dL 7.3 7.3 7.6  Albumin 3.5 - 5.2 Thompson/dL 4.3 4.4 4.4  AST 0 - 37 U/L 22 19 22   ALT 0 - 35 U/L 15 13 15   Alk Phosphatase 39 - 117 U/L 121(H) 114 142(H)  Total Bilirubin 0.2 - 1.2 mg/dL 0.5 0.6 0.8  Bilirubin, Direct 0.0 - 0.3 mg/dL - - -   We discussed:  - patient reports taking since 2017 and was placed on this by a previous provider   Plan Continue current medications.   OTCs/Supplements  Patient is currently on the following;   Vitamin C 500 mg 1 tablet daily  Sodium flouride 1.1% dental gel brush thoroughly at bedtime  Allergy rhinitis (per patient report)   Patient is currently on the following medications:  Montelukast 10 mg 1 tablet at bedtime  We discussed:   Patient reports taking this for a long time; possibly for allergies, but patient reports she is not sure if this is doing anything. Patient has had problems with allergies especially during pollen season.  - Patient reported have taken Benadryl, but stopped due to drowsiness. Discussed other non- drowsy options for  allergies.  Plan Consulted with Dr. Martinique for thoughts on discontinuing. Patient to try only taking montelukast when having allergy symptoms (allergy season).  Called patient and relayed information.   Continue current medications  Vaccines   Reviewed and discussed patient's vaccination history.    Immunization History  Administered Date(s) Administered  . Hep A / Hep B 11/29/2016  . Influenza, Seasonal, Injecte, Preservative Fre 12/23/2014  . Influenza,inj,Quad PF,6+ Mos 11/29/2016, 01/02/2018, 11/26/2018  . Influenza-Unspecified 12/23/2014  . PFIZER SARS-COV-2 Vaccination 05/22/2019, 06/16/2019  . PPD Test 11/29/2016  . Tdap 02/09/2012     Medication Management  Patient organizes medications:uses weekly pill box Primary pharmacy: [facility]  Adherence: no gaps in refill history (per medication dispense history from 01/02/2019 to 07/01/2019)    Follow-up Follow up visit with PharmD in 1 year. Will conduct general telephone calls for periodic check-ins before next visit.    Anson Crofts, PharmD Clinical Pharmacist [facility] [phone]

## 2019-07-01 ENCOUNTER — Other Ambulatory Visit: Payer: Self-pay

## 2019-07-01 ENCOUNTER — Ambulatory Visit: Payer: PPO

## 2019-07-01 DIAGNOSIS — K219 Gastro-esophageal reflux disease without esophagitis: Secondary | ICD-10-CM

## 2019-07-01 DIAGNOSIS — I10 Essential (primary) hypertension: Secondary | ICD-10-CM

## 2019-07-01 DIAGNOSIS — K7581 Nonalcoholic steatohepatitis (NASH): Secondary | ICD-10-CM

## 2019-07-01 DIAGNOSIS — I429 Cardiomyopathy, unspecified: Secondary | ICD-10-CM

## 2019-07-01 DIAGNOSIS — E7800 Pure hypercholesterolemia, unspecified: Secondary | ICD-10-CM

## 2019-07-01 DIAGNOSIS — G4700 Insomnia, unspecified: Secondary | ICD-10-CM

## 2019-07-09 NOTE — Patient Instructions (Addendum)
Visit Information  Goals Addressed            This Visit's Progress   . Pharmacy Care Plan       CARE PLAN ENTRY  Current Barriers:  . Chronic Disease Management support, education, and care coordination needs related to HTN, HLD, and Cardiomyopathy, GERD, Insomnia, NASH  Controlled hypertension   Current antihypertensive regimen: Enalapril 20 mg 1 tablet daily  and Verapamil 120 mg 1 tablet daily . Previous antihypertensives tried: none  . Last practice recorded BP readings:  BP Readings from Last 3 Encounters:  05/26/19 126/80  04/24/19 136/90  11/26/18 130/64 .  Current home BP readings: 124-128/ 80-85 mmHg.  Uncontrolled hyperlipidemia . Current antihyperlipidemic regimen: Lovastatin 40 mg 1 tablet at bedtime . Previous antihyperlipidemic medications tried none  . Most recent lipid panel:     Component Value Date/Time   CHOL 193 11/26/2018 0822   TRIG 81.0 11/26/2018 0822   HDL 51.90 11/26/2018 0822   CHOLHDL 4 11/26/2018 0822   VLDL 16.2 11/26/2018 0822   LDLCALC 124 (H) 11/26/2018 0822  The 10-year ASCVD risk score Mikey Bussing DC Jr., et al., 2013) is: 6.1% Acid reflux / GERD . Current regimen: pantoprazole 38m, 1 tablet daily (currently taking 3 times per week).  Insomnia . Current regimen: Zolpidem 5 mg, 0.5 to 1 tablet at bedtime as needed for sleep NASH (non-alcoholic steatohepatitis)  . Current regimen: Vitamin E 400 units 1 capsule daily  Allergy rhinitis . Current regimen: Montelukast 10 mg 1 tablet at bedtime  Pharmacist Clinical Goal(s):  .Marland KitchenOver the next 14 days, patient will work with PharmD and providers to optimize allergy rhinitis regimen.  . High blood pressure:  .Marland KitchenMaintain Blood pressure <130/80 mmHg  . High cholesterol:  . Cholesterol goals: Total Cholesterol goal under 200, Triglycerides goal under 150, HDL goal above 40 (men) or above 50 (women), LDL goal under 100.   Interventions: . Comprehensive medication review performed; medication list  updated in electronic medical record.  .[facility] team collaboration  . High blood pressure:  . Discussed diet modifications. DASH diet:  following a diet emphasizing fruits and vegetables and low-fat dairy products along with whole grains, fish, poultry, and nuts. Reducing red meats and sugars.  . Exercising . Reducing the amount of salt intake to <15062mper day.  . Recommend using a salt substitute to replace your salt if you need flavor.      . Weight reduction- We discussed losing 5-10% of body weight . High cholesterol . How to reduce cholesterol through diet/weight management and physical activity. . We discussed how a diet high in plant sterols (fruits/vegetables/nuts/whole grains/legumes) may reduce your cholesterol. Encouraged increasing fiber to a daily intake of 10-25g/day  . Acid reflux . We discussed non-pharmacological interventions for acid reflux. Take measures to prevent acid reflux, such as avoiding spicy foods, avoiding caffeine, avoid laying down a few hours after eating, and raising the head of the bed . Allergies . Consulted with Dr. JoMartiniquePatient to take montelukast 1079muring allergy season (Can stop when not needed).  Patient Self Care Activities:  . Patient will continue to check BP daily , document, and provide at future appointments . Continue current medications as directed by providers.  . Continue following up with primary care provider and/or specialists. . Continue at home blood pressure readings. . Continue working on healthy habits (diet/ exercise).  Initial goal documentation        Ms. SolLongbottoms given information  about Chronic Care Management services today including:  1. CCM service includes personalized support from designated clinical staff supervised by her physician, including individualized plan of care and coordination with other care providers 2. 24/7 contact phone numbers for assistance for urgent and routine care  needs. 3. Standard insurance, coinsurance, copays and deductibles apply for chronic care management only during months in which we provide at least 20 minutes of these services. Most insurances cover these services at 100%, however patients may be responsible for any copay, coinsurance and/or deductible if applicable. This service may help you avoid the need for more expensive face-to-face services. 4. Only one practitioner may furnish and bill the service in a calendar month. 5. The patient may stop CCM services at any time (effective at the end of the month) by phone call to the office staff.  Patient agreed to services and verbal consent obtained.   The patient verbalized understanding of instructions provided today and agreed to receive a mailed copy of patient instruction and/or educational materials. Telephone follow up appointment with pharmacy team member scheduled for: 06/29/2020.   Anson Crofts, PharmD Clinical Pharmacist [facility] [place] [phone]      Poneto stands for "Dietary Approaches to Stop Hypertension." The DASH eating plan is a healthy eating plan that has been shown to reduce high blood pressure (hypertension). It may also reduce your risk for type 2 diabetes, heart disease, and stroke. The DASH eating plan may also help with weight loss. What are tips for following this plan?  General guidelines  Avoid eating more than 2,300 mg (milligrams) of salt (sodium) a day. If you have hypertension, you may need to reduce your sodium intake to 1,500 mg a day.  Limit alcohol intake to no more than 1 drink a day for nonpregnant women and 2 drinks a day for men. One drink equals 12 oz of beer, 5 oz of wine, or 1 oz of hard liquor.  Work with your health care provider to maintain a healthy body weight or to lose weight. Ask what an ideal weight is for you.  Get at least 30 minutes of exercise that causes your heart to beat faster  (aerobic exercise) most days of the week. Activities may include walking, swimming, or biking.  Work with your health care provider or diet and nutrition specialist (dietitian) to adjust your eating plan to your individual calorie needs. Reading food labels   Check food labels for the amount of sodium per serving. Choose foods with less than 5 percent of the Daily Value of sodium. Generally, foods with less than 300 mg of sodium per serving fit into this eating plan.  To find whole grains, look for the word "whole" as the first word in the ingredient list. Shopping  Buy products labeled as "low-sodium" or "no salt added."  Buy fresh foods. Avoid canned foods and premade or frozen meals. Cooking  Avoid adding salt when cooking. Use salt-free seasonings or herbs instead of table salt or sea salt. Check with your health care provider or pharmacist before using salt substitutes.  Do not fry foods. Cook foods using healthy methods such as baking, boiling, grilling, and broiling instead.  Cook with heart-healthy oils, such as olive, canola, soybean, or sunflower oil. Meal planning  Eat a balanced diet that includes: ? 5 or more servings of fruits and vegetables each day. At each meal, try to fill half of your plate with fruits and vegetables. ?  Up to 6-8 servings of whole grains each day. ? Less than 6 oz of lean meat, poultry, or fish each day. A 3-oz serving of meat is about the same size as a deck of cards. One egg equals 1 oz. ? 2 servings of low-fat dairy each day. ? A serving of nuts, seeds, or beans 5 times each week. ? Heart-healthy fats. Healthy fats called Omega-3 fatty acids are found in foods such as flaxseeds and coldwater fish, like sardines, salmon, and mackerel.  Limit how much you eat of the following: ? Canned or prepackaged foods. ? Food that is high in trans fat, such as fried foods. ? Food that is high in saturated fat, such as fatty meat. ? Sweets, desserts, sugary  drinks, and other foods with added sugar. ? Full-fat dairy products.  Do not salt foods before eating.  Try to eat at least 2 vegetarian meals each week.  Eat more home-cooked food and less restaurant, buffet, and fast food.  When eating at a restaurant, ask that your food be prepared with less salt or no salt, if possible. What foods are recommended? The items listed may not be a complete list. Talk with your dietitian about what dietary choices are best for you. Grains Whole-grain or whole-wheat bread. Whole-grain or whole-wheat pasta. Brown rice. [place]. Bulgur. Whole-grain and low-sodium cereals. Pita bread. Low-fat, low-sodium crackers. Whole-wheat flour tortillas. Vegetables Fresh or frozen vegetables (raw, steamed, roasted, or grilled). Low-sodium or reduced-sodium tomato and vegetable juice. Low-sodium or reduced-sodium tomato sauce and tomato paste. Low-sodium or reduced-sodium canned vegetables. Fruits All fresh, dried, or frozen fruit. Canned fruit in natural juice (without added sugar). Meat and other protein foods Skinless chicken or [place]. Ground chicken or [place]. Pork with fat trimmed off. Fish and seafood. Egg whites. Dried beans, peas, or lentils. Unsalted nuts, nut butters, and seeds. Unsalted canned beans. Lean cuts of beef with fat trimmed off. Low-sodium, lean deli meat. Dairy Low-fat (1%) or fat-free (skim) milk. Fat-free, low-fat, or reduced-fat cheeses. Nonfat, low-sodium ricotta or cottage cheese. Low-fat or nonfat yogurt. Low-fat, low-sodium cheese. Fats and oils Soft margarine without trans fats. Vegetable oil. Low-fat, reduced-fat, or light mayonnaise and salad dressings (reduced-sodium). Canola, safflower, olive, soybean, and sunflower oils. Avocado. Seasoning and other foods Herbs. Spices. Seasoning mixes without salt. Unsalted popcorn and pretzels. Fat-free sweets. What foods are not recommended? The items listed may not be a complete list. Talk  with your dietitian about what dietary choices are best for you. Grains Baked goods made with fat, such as croissants, muffins, or some breads. Dry pasta or rice meal packs. Vegetables Creamed or fried vegetables. Vegetables in a cheese sauce. Regular canned vegetables (not low-sodium or reduced-sodium). Regular canned tomato sauce and paste (not low-sodium or reduced-sodium). Regular tomato and vegetable juice (not low-sodium or reduced-sodium). Angie Fava. Olives. Fruits Canned fruit in a light or heavy syrup. Fried fruit. Fruit in cream or butter sauce. Meat and other protein foods Fatty cuts of meat. Ribs. Fried meat. Berniece Salines. Sausage. Bologna and other processed lunch meats. Salami. Fatback. Hotdogs. Bratwurst. Salted nuts and seeds. Canned beans with added salt. Canned or smoked fish. Whole eggs or egg yolks. Chicken or [place] with skin. Dairy Whole or 2% milk, cream, and half-and-half. Whole or full-fat cream cheese. Whole-fat or sweetened yogurt. Full-fat cheese. Nondairy creamers. Whipped toppings. Processed cheese and cheese spreads. Fats and oils Butter. Stick margarine. Lard. Shortening. Ghee. Bacon fat. Tropical oils, such as coconut, palm kernel, or palm oil.  Seasoning and other foods Salted popcorn and pretzels. Onion salt, garlic salt, seasoned salt, table salt, and sea salt. Worcestershire sauce. Tartar sauce. Barbecue sauce. Teriyaki sauce. Soy sauce, including reduced-sodium. Steak sauce. Canned and packaged gravies. Fish sauce. Oyster sauce. Cocktail sauce. Horseradish that you find on the shelf. Ketchup. Mustard. Meat flavorings and tenderizers. Bouillon cubes. Hot sauce and Tabasco sauce. Premade or packaged marinades. Premade or packaged taco seasonings. Relishes. Regular salad dressings. Where to find more information:  National Heart, Lung, and [place]: https://wilson-eaton.com/  American Heart Association: www.heart.org Summary  The DASH eating plan is a healthy eating plan  that has been shown to reduce high blood pressure (hypertension). It may also reduce your risk for type 2 diabetes, heart disease, and stroke.  With the DASH eating plan, you should limit salt (sodium) intake to 2,300 mg a day. If you have hypertension, you may need to reduce your sodium intake to 1,500 mg a day.  When on the DASH eating plan, aim to eat more fresh fruits and vegetables, whole grains, lean proteins, low-fat dairy, and heart-healthy fats.  Work with your health care provider or diet and nutrition specialist (dietitian) to adjust your eating plan to your individual calorie needs. This information is not intended to replace advice given to you by your health care provider. Make sure you discuss any questions you have with your health care provider. Document Revised: 02/09/2017 Document Reviewed: 02/21/2016 Elsevier Patient Education  2020 [employer].

## 2019-07-25 ENCOUNTER — Other Ambulatory Visit: Payer: Self-pay | Admitting: Family Medicine

## 2019-09-07 ENCOUNTER — Other Ambulatory Visit: Payer: Self-pay | Admitting: Family Medicine

## 2019-09-07 DIAGNOSIS — I10 Essential (primary) hypertension: Secondary | ICD-10-CM

## 2019-10-20 ENCOUNTER — Other Ambulatory Visit: Payer: Self-pay | Admitting: Family Medicine

## 2019-10-22 DIAGNOSIS — Z1231 Encounter for screening mammogram for malignant neoplasm of breast: Secondary | ICD-10-CM | POA: Diagnosis not present

## 2019-10-30 ENCOUNTER — Telehealth: Payer: Self-pay | Admitting: Family Medicine

## 2019-10-30 DIAGNOSIS — Z0184 Encounter for antibody response examination: Secondary | ICD-10-CM

## 2019-10-30 NOTE — Telephone Encounter (Signed)
Pt called to see if she could get a COVID antibodies test.  Please advise.

## 2019-10-31 NOTE — Telephone Encounter (Signed)
Okay to place order?

## 2019-11-03 NOTE — Telephone Encounter (Signed)
It is ok to order lab. Thanks, BJ

## 2019-11-03 NOTE — Telephone Encounter (Signed)
Order is placed, okay to schedule lab appt.

## 2019-11-03 NOTE — Telephone Encounter (Signed)
Pt is scheduled with lab tomorrow at 10:00 am

## 2019-11-04 ENCOUNTER — Other Ambulatory Visit: Payer: Self-pay

## 2019-11-04 ENCOUNTER — Other Ambulatory Visit: Payer: PPO

## 2019-11-04 DIAGNOSIS — Z0184 Encounter for antibody response examination: Secondary | ICD-10-CM | POA: Diagnosis not present

## 2019-11-05 LAB — SARS COV-2 SEROLOGY(COVID-19)AB(IGG,IGM),IMMUNOASSAY
SARS CoV-2 AB IgG: NEGATIVE
SARS CoV-2 IgM: NEGATIVE

## 2019-11-11 ENCOUNTER — Telehealth: Payer: Self-pay | Admitting: Family Medicine

## 2019-11-11 NOTE — Telephone Encounter (Signed)
Pt is calling to get someone to call her back about her antibody COVID test. She does not understand the results.  Please call her at [phone]  Please advise

## 2019-11-11 NOTE — Telephone Encounter (Signed)
Spoke with patient, we went over the results. She verbalized understanding and all questions were answered.

## 2019-11-25 ENCOUNTER — Other Ambulatory Visit: Payer: Self-pay

## 2019-11-26 ENCOUNTER — Ambulatory Visit (INDEPENDENT_AMBULATORY_CARE_PROVIDER_SITE_OTHER): Payer: PPO | Admitting: Family Medicine

## 2019-11-26 ENCOUNTER — Encounter: Payer: Self-pay | Admitting: Family Medicine

## 2019-11-26 VITALS — BP 124/74 | HR 93 | Resp 16 | Ht 63.0 in | Wt 158.4 lb

## 2019-11-26 DIAGNOSIS — K7581 Nonalcoholic steatohepatitis (NASH): Secondary | ICD-10-CM

## 2019-11-26 DIAGNOSIS — Z23 Encounter for immunization: Secondary | ICD-10-CM | POA: Diagnosis not present

## 2019-11-26 DIAGNOSIS — Z0000 Encounter for general adult medical examination without abnormal findings: Secondary | ICD-10-CM | POA: Diagnosis not present

## 2019-11-26 DIAGNOSIS — E7800 Pure hypercholesterolemia, unspecified: Secondary | ICD-10-CM | POA: Diagnosis not present

## 2019-11-26 DIAGNOSIS — Z780 Asymptomatic menopausal state: Secondary | ICD-10-CM

## 2019-11-26 DIAGNOSIS — I10 Essential (primary) hypertension: Secondary | ICD-10-CM | POA: Diagnosis not present

## 2019-11-26 NOTE — Patient Instructions (Addendum)
Today you have you routine preventive visit. A few things to remember from today's visit:   Essential hypertension - Plan: COMPLETE METABOLIC PANEL WITH GFR  Hypercholesteremia - Plan: COMPLETE METABOLIC PANEL WITH GFR, Lipid panel  NASH (nonalcoholic steatohepatitis) - Plan: COMPLETE METABOLIC PANEL WITH GFR  Asymptomatic postmenopausal estrogen deficiency - Plan: DEXAScan  Routine physical examination  If you need refills please call your pharmacy. Do not use My Chart to request refills or for acute issues that need immediate attention.   Please be sure medication list is accurate. If a new problem present, please set up appointment sooner than planned today.  At least 150 minutes of moderate exercise per week, daily brisk walking for 15-30 min is a good exercise option. Healthy diet low in saturated (animal) fats and sweets and consisting of fresh fruits and vegetables, lean meats such as fish and white chicken and whole grains.  These are some of recommendations for screening depending of age and risk factors:  - Vaccines:  Tdap vaccine every 10 years.  Shingles vaccine recommended at age 89, could be given after 59 years of age but not sure about insurance coverage.   Pneumonia vaccines: Pneumovax at 50. Sometimes Pneumovax is giving earlier if history of smoking, lung disease,diabetes,kidney disease among some.  Screening for diabetes at age 63 and every 3 years.  Cervical cancer prevention:  Pap smear starts at 59 years of age and continues periodically until 59 years old in low risk women. Pap smear every 3 years between 86 and 13 years old. Pap smear every 3-5 years between women 26 and older if pap smear negative and HPV screening negative.   -Breast cancer: Mammogram: There is disagreement between experts about when to start screening in low risk asymptomatic female but recent recommendations are to start screening at 33 and not later than 59 years old , every 1-2  years and after 59 yo q 2 years. Screening is recommended until 59 years old but some women can continue screening depending of healthy issues.  Colon cancer screening: Has been recently changed to 59 yo. Insurance may not cover until you are 59 years old. Screening is recommended until 59 years old.  Cholesterol disorder screening at age 1 and every 3 years.N/A  Also recommended:  1. Dental visit- Brush and floss your teeth twice daily; visit your dentist twice a year. 2. Eye doctor- Get an eye exam at least every 2 years. 3. Helmet use- Always wear a helmet when riding a bicycle, motorcycle, rollerblading or skateboarding. 4. Safe sex- If you may be exposed to sexually transmitted infections, use a condom. 5. Seat belts- Seat belts can save your live; always wear one. 6. Smoke/Carbon Monoxide detectors- These detectors need to be installed on the appropriate level of your home. Replace batteries at least once a year. 7. Skin cancer- When out in the sun please cover up and use sunscreen 15 SPF or higher. 8. Violence- If anyone is threatening or hurting you, please tell your healthcare provider.  9. Drink alcohol in moderation- Limit alcohol intake to one drink or less per day. Never drink and drive. 10. Calcium supplementation 1000 to 1200 mg daily, ideally through your diet.  Vitamin D supplementation 800 units daily.

## 2019-11-26 NOTE — Progress Notes (Signed)
HPI: Latoya Thompson is a 59 y.o. female, who is here today for her routine physical.  Last CPE: A few years ago. AWV on 11/26/2018. She is on disability die to blindness. Currently she is attending school,getting her master degree.  Regular exercise 3 or more time per week: She walks 5 times per week Following a healthy diet: Yes. She lives with her husband.  Chronic medical problems: HTN,HLD,abnormal LFT's,bilateral keratoconus,left eye blindness, cardiomyopathy/LBBB among some.  Pap smear:2020. She follows with gyn annually.   Immunization History  Administered Date(s) Administered  . Hep A / Hep B 11/29/2016  . Influenza, Seasonal, Injecte, Preservative Fre 12/23/2014  . Influenza,inj,Quad PF,6+ Mos 11/29/2016, 01/02/2018, 11/26/2018, 11/26/2019  . Influenza-Unspecified 12/23/2014  . PFIZER SARS-COV-2 Vaccination 05/22/2019, 06/16/2019  . PPD Test 11/29/2016  . Tdap 02/09/2012    Mammogram: 10/2019. Colonoscopy: 10/05/15, 10 years f/u was recommended. DEXA: N/A  Hep C screening: 08/17/14 NR  She has no concerns today.  GERD: She takes Pantoprazole 20 mg prn, about 2 times per week.  HLD: She is on Lovastatin 40 mg daily. Tolerating medication well.  Component     Latest Ref Rng & Units 11/26/2018  Cholesterol     <200 mg/dL 193  Triglycerides     <150 mg/dL 81.0  HDL Cholesterol     > OR = 50 mg/dL 51.90  VLDL     0.0 - 40.0 mg/dL 16.2  LDL (calc)     0 - 99 mg/dL 124 (H)  Total CHOL/HDL Ratio     <5.0 (calc) 4  NonHDL      140.60   NASH: LFT's has improved through the years. She is on Vit E supplementation. She has seen GI.   Total Bilirubin     0.2 - 1.2 mg/dL 0.5  Alkaline Phosphatase     39 - 117 U/L 121 (H)  AST     10 - 35 U/L 22  ALT     6 - 29 U/L 15  Total Protein     6.1 - 8.1 g/dL 7.3  Albumin     3.5 - 5.2 g/dL 4.3  Calcium     8.6 - 10.4 mg/dL 9.7  GFR     >60.00 mL/min 64.91   HTN: She is on Enalapril 20 mg daily  and Verapamil 120 mg daily. Negative for severe/frequent headache, chest pain, dyspnea, or edema. She follows with cardiologist. Echo in 05/01/16: LVEF 45-50% and grade I diastolic dysfunction.  Component     Latest Ref Rng & Units 05/26/2019  Sodium     135 - 146 mmol/L 142  Potassium     3.5 - 5.3 mmol/L 3.8  Chloride     98 - 110 mmol/L 106  CO2     20 - 32 mmol/L 26  Glucose     65 - 99 mg/dL 89  BUN     7 - 25 mg/dL 16  Creatinine     0.50 - 1.05 mg/dL 1.05    Review of Systems  Constitutional: Negative for appetite change, fatigue and fever.  HENT: Negative for hearing loss, mouth sores and sore throat.   Eyes: Negative for pain and redness.  Respiratory: Negative for cough and wheezing.   Cardiovascular: Negative for palpitations and leg swelling.  Gastrointestinal: Negative for abdominal pain, nausea and vomiting.       No changes in bowel habits.  Endocrine: Negative for cold intolerance, heat intolerance, polydipsia, polyphagia and polyuria.  Genitourinary: Negative for decreased urine volume, dysuria, hematuria, vaginal bleeding and vaginal discharge.  Musculoskeletal: Negative for arthralgias, gait problem and myalgias.  Skin: Negative for color change and rash.  Allergic/Immunologic: Positive for environmental allergies.  Neurological: Negative for syncope and weakness.  Hematological: Negative for adenopathy. Does not bruise/bleed easily.  Psychiatric/Behavioral: Positive for sleep disturbance. Negative for confusion. The patient is not nervous/anxious.   All other systems reviewed and are negative.   Current Outpatient Medications on File Prior to Visit  Medication Sig Dispense Refill  . aspirin EC 81 MG tablet Take 81 mg by mouth daily.    . enalapril (VASOTEC) 20 MG tablet Take 1 tablet by mouth once daily 90 tablet 1  . lovastatin (MEVACOR) 40 MG tablet TAKE 1 TABLET BY MOUTH AT BEDTIME 90 tablet 0  . montelukast (SINGULAIR) 10 MG tablet TAKE 1 TABLET BY  MOUTH AT BEDTIME 90 tablet 1  . pantoprazole (PROTONIX) 20 MG tablet Take 1 tablet by mouth once daily 90 tablet 1  . sodium fluoride (FLUORISHIELD) 1.1 % GEL dental gel SF 5000 Plus 1.1 % dental cream  BRUSH THOROUGHLY ONCE DAILY AT BEDTIME    . verapamil (CALAN) 120 MG tablet Take 1 tablet by mouth once daily 90 tablet 1  . vitamin C (ASCORBIC ACID) 500 MG tablet Take 500 mg by mouth daily.    . vitamin E 400 UNIT capsule Take 400 Units by mouth daily.    Marland Kitchen zolpidem (AMBIEN) 5 MG tablet TAKE 1/2 TO 1 (ONE-HALF TO ONE) TABLET BY MOUTH AT BEDTIME AS NEEDED FOR SLEEP 15 tablet 1   No current facility-administered medications on file prior to visit.    Past Medical History:  Diagnosis Date  . Chicken pox   . Hypercholesteremia   . Hypertension   . Keratoconus    Left    Past Surgical History:  Procedure Laterality Date  . BREAST SURGERY     biopsy  . CORNEAL TRANSPLANT      Allergies  Allergen Reactions  . Codeine Itching    Family History  Problem Relation Age of Onset  . Hyperlipidemia Mother   . Heart disease Mother   . Stroke Mother   . Hypertension Mother   . Diabetes Mother   . Hyperlipidemia Father   . Heart disease Father   . Stroke Father   . Hypertension Father   . Diabetes Father     Social History   Socioeconomic History  . Marital status: Married    Spouse name: Eddie Dibbles  . Number of children: 0  . Years of education: Not on file  . Highest education level: Bachelor's degree (e.g., BA, AB, BS)  Occupational History  . Occupation: Educator  Tobacco Use  . Smoking status: Never Smoker  . Smokeless tobacco: Never Used  Substance and Sexual Activity  . Alcohol use: No  . Drug use: No  . Sexual activity: Not on file  Other Topics Concern  . Not on file  Social History Narrative   Married, lives with husband Eddie Dibbles in a 2 story home. Drinks 1/2 cup of coffee a day, rare soda or tea. Exercise regularly for at least 60 minutes.   Social Determinants  of Health   Financial Resource Strain: Low Risk   . Difficulty of Paying Living Expenses: Not hard at all  Food Insecurity:   . Worried About Charity fundraiser in the Last Year: Not on file  . Ran Out of Food in the Last  Year: Not on file  Transportation Needs: No Transportation Needs  . Lack of Transportation (Medical): No  . Lack of Transportation (Non-Medical): No  Physical Activity:   . Days of Exercise per Week: Not on file  . Minutes of Exercise per Session: Not on file  Stress:   . Feeling of Stress : Not on file  Social Connections:   . Frequency of Communication with Friends and Family: Not on file  . Frequency of Social Gatherings with Friends and Family: Not on file  . Attends Religious Services: Not on file  . Active Member of Clubs or Organizations: Not on file  . Attends Archivist Meetings: Not on file  . Marital Status: Not on file    Vitals:   11/26/19 0730  BP: 124/74  Pulse: 93  Resp: 16  SpO2: 98%   Body mass index is 28.05 kg/m.  Wt Readings from Last 3 Encounters:  11/26/19 158 lb 6 oz (71.8 kg)  05/26/19 158 lb (71.7 kg)  04/24/19 154 lb (69.9 kg)    Physical Exam Vitals and nursing note reviewed.  Constitutional:      General: She is not in acute distress.    Appearance: She is well-developed.  HENT:     Head: Normocephalic and atraumatic.     Right Ear: Hearing, tympanic membrane, ear canal and external ear normal.     Left Ear: Hearing, tympanic membrane, ear canal and external ear normal.     Mouth/Throat:     Pharynx: Uvula midline.  Eyes:     Extraocular Movements: Extraocular movements intact.     Conjunctiva/sclera:     Left eye: Left conjunctiva is injected.     Pupils:     Left eye: Pupil is not reactive.  Neck:     Thyroid: No thyromegaly.     Trachea: No tracheal deviation.  Cardiovascular:     Rate and Rhythm: Normal rate and regular rhythm.     Pulses:          Dorsalis pedis pulses are 2+ on the right  side and 2+ on the left side.     Heart sounds: No murmur heard.   Pulmonary:     Effort: Pulmonary effort is normal. No respiratory distress.     Breath sounds: Normal breath sounds.  Abdominal:     Palpations: Abdomen is soft. There is no hepatomegaly or mass.     Tenderness: There is no abdominal tenderness.  Genitourinary:    Comments: Deferred to gyn. Musculoskeletal:     Comments: No signs of synovitis appreciated.  Lymphadenopathy:     Cervical: No cervical adenopathy.     Upper Body:     Right upper body: No supraclavicular adenopathy.     Left upper body: No supraclavicular adenopathy.  Skin:    General: Skin is warm.     Findings: No erythema or rash.  Neurological:     Mental Status: She is alert and oriented to person, place, and time.     Cranial Nerves: No cranial nerve deficit.     Coordination: Coordination normal.     Gait: Gait normal.     Deep Tendon Reflexes:     Reflex Scores:      Bicep reflexes are 2+ on the right side and 2+ on the left side.      Patellar reflexes are 2+ on the right side and 2+ on the left side. Psychiatric:        Speech:  Speech normal.     Comments: Well groomed, good eye contact.   ASSESSMENT AND PLAN:  Ms. MADALYNNE GUTMANN was here today annual physical examination.  Orders Placed This Encounter  Procedures  . DEXAScan  . Flu Vaccine QUAD 36+ mos IM  . COMPLETE METABOLIC PANEL WITH GFR  . Lipid panel   Lab Results  Component Value Date   CHOL 195 11/26/2019   HDL 54 11/26/2019   LDLCALC 124 (H) 11/26/2019   TRIG 77 11/26/2019   CHOLHDL 3.6 11/26/2019   Lab Results  Component Value Date   ALT 13 11/26/2019   AST 19 11/26/2019   ALKPHOS 121 (H) 05/26/2019   BILITOT 0.7 11/26/2019   Lab Results  Component Value Date   CREATININE 1.02 11/26/2019   BUN 17 11/26/2019   NA 142 11/26/2019   K 4.6 11/26/2019   CL 106 11/26/2019   CO2 29 11/26/2019    Routine physical examination We discussed the importance of  regular physical activity and healthy diet for prevention of chronic illness and/or complications. Preventive guidelines reviewed. Vaccination is up to date. Ca++ and vit D supplementation to continue. Next CPE in a year.  Essential hypertension BP adequately controlled. No changes in current management,  Hypercholesteremia Continue Lovastatin 40 mg and low fat diet. Medication will be adjusted according to FLP results.  NASH (nonalcoholic steatohepatitis) LFT's has improved. Continue Vit E supplementation and statin.  Asymptomatic postmenopausal estrogen deficiency -     DEXAScan; Future  Need for influenza vaccination -     Flu Vaccine QUAD 36+ mos IM   Return in 6 months (on 05/25/2020) for htn.   Berma Harts G. [place], MD  [facility]. [place] office.  Today you have you routine preventive visit. A few things to remember from today's visit:   Essential hypertension - Plan: COMPLETE METABOLIC PANEL WITH GFR  Hypercholesteremia - Plan: COMPLETE METABOLIC PANEL WITH GFR, Lipid panel  NASH (nonalcoholic steatohepatitis) - Plan: COMPLETE METABOLIC PANEL WITH GFR  Asymptomatic postmenopausal estrogen deficiency - Plan: DEXAScan  Routine physical examination  If you need refills please call your pharmacy. Do not use My Chart to request refills or for acute issues that need immediate attention.   Please be sure medication list is accurate. If a new problem present, please set up appointment sooner than planned today.  At least 150 minutes of moderate exercise per week, daily brisk walking for 15-30 min is a good exercise option. Healthy diet low in saturated (animal) fats and sweets and consisting of fresh fruits and vegetables, lean meats such as fish and white chicken and whole grains.  These are some of recommendations for screening depending of age and risk factors:  - Vaccines:  Tdap vaccine every 10 years.  Shingles vaccine recommended at age 19,  could be given after 59 years of age but not sure about insurance coverage.   Pneumonia vaccines: Pneumovax at 72. Sometimes Pneumovax is giving earlier if history of smoking, lung disease,diabetes,kidney disease among some.  Screening for diabetes at age 31 and every 3 years.  Cervical cancer prevention:  Pap smear starts at 59 years of age and continues periodically until 59 years old in low risk women. Pap smear every 3 years between 102 and 79 years old. Pap smear every 3-5 years between women 57 and older if pap smear negative and HPV screening negative.   -Breast cancer: Mammogram: There is disagreement between experts about when to start screening in low risk asymptomatic  female but recent recommendations are to start screening at 31 and not later than 59 years old , every 1-2 years and after 59 yo q 2 years. Screening is recommended until 59 years old but some women can continue screening depending of healthy issues.  Colon cancer screening: Has been recently changed to 59 yo. Insurance may not cover until you are 59 years old. Screening is recommended until 59 years old.  Cholesterol disorder screening at age 54 and every 3 years.N/A  Also recommended:  1. Dental visit- Brush and floss your teeth twice daily; visit your dentist twice a year. 2. Eye doctor- Get an eye exam at least every 2 years. 3. Helmet use- Always wear a helmet when riding a bicycle, motorcycle, rollerblading or skateboarding. 4. Safe sex- If you may be exposed to sexually transmitted infections, use a condom. 5. Seat belts- Seat belts can save your live; always wear one. 6. Smoke/Carbon Monoxide detectors- These detectors need to be installed on the appropriate level of your home. Replace batteries at least once a year. 7. Skin cancer- When out in the sun please cover up and use sunscreen 15 SPF or higher. 8. Violence- If anyone is threatening or hurting you, please tell your healthcare provider.  9. Drink  alcohol in moderation- Limit alcohol intake to one drink or less per day. Never drink and drive. 10. Calcium supplementation 1000 to 1200 mg daily, ideally through your diet.  Vitamin D supplementation 800 units daily.

## 2019-11-27 LAB — COMPLETE METABOLIC PANEL WITH GFR
AG Ratio: 1.6 (calc) (ref 1.0–2.5)
ALT: 13 U/L (ref 6–29)
AST: 19 U/L (ref 10–35)
Albumin: 4.5 g/dL (ref 3.6–5.1)
Alkaline phosphatase (APISO): 117 U/L (ref 37–153)
BUN: 17 mg/dL (ref 7–25)
CO2: 29 mmol/L (ref 20–32)
Calcium: 9.8 mg/dL (ref 8.6–10.4)
Chloride: 106 mmol/L (ref 98–110)
Creat: 1.02 mg/dL (ref 0.50–1.05)
GFR, Est African American: 70 mL/min/{1.73_m2} (ref >=60)
GFR, Est Non African American: 60 mL/min/{1.73_m2} (ref >=60)
Globulin: 2.9 g/dL (calc) (ref 1.9–3.7)
Glucose, Bld: 91 mg/dL (ref 65–99)
Potassium: 4.6 mmol/L (ref 3.5–5.3)
Sodium: 142 mmol/L (ref 135–146)
Total Bilirubin: 0.7 mg/dL (ref 0.2–1.2)
Total Protein: 7.4 g/dL (ref 6.1–8.1)

## 2019-11-27 LAB — LIPID PANEL
Cholesterol: 195 mg/dL (ref <200)
HDL: 54 mg/dL (ref >=50)
LDL Cholesterol (Calc): 124 mg/dL (calc) — ABNORMAL HIGH
Non-HDL Cholesterol (Calc): 141 mg/dL (calc) — ABNORMAL HIGH (ref <130)
Total CHOL/HDL Ratio: 3.6 (calc) (ref <5.0)
Triglycerides: 77 mg/dL (ref <150)

## 2019-12-10 ENCOUNTER — Other Ambulatory Visit: Payer: Self-pay

## 2019-12-10 ENCOUNTER — Ambulatory Visit (INDEPENDENT_AMBULATORY_CARE_PROVIDER_SITE_OTHER): Payer: PPO

## 2019-12-10 DIAGNOSIS — Z0000 Encounter for general adult medical examination without abnormal findings: Secondary | ICD-10-CM

## 2019-12-10 NOTE — Progress Notes (Signed)
Virtual Visit via Telephone Note  I connected with  Latoya Thompson on 12/10/19 at  3:15 PM EDT by telephone and verified that I am speaking with the correct person using two identifiers.  Medicare Annual Wellness visit completed telephonically due to Covid-19 pandemic.   Persons participating in this call: This Health Coach and this patient.   Location: Patient: Home Provider: Office   I discussed the limitations, risks, security and privacy concerns of performing an evaluation and management service by telephone and the availability of in person appointments. The patient expressed understanding and agreed to proceed.  Unable to perform video visit due to video visit attempted and failed and/or patient does not have video capability.   Some vital signs may be absent or patient reported.   Willette Brace, LPN    Subjective:   Latoya Thompson is a 59 y.o. female who presents for Medicare Annual (Subsequent) preventive examination.  Review of Systems     Cardiac Risk Factors include: advanced age (>85mn, >>68women);hypertension     Objective:    There were no vitals filed for this visit. There is no height or weight on file to calculate BMI.  Advanced Directives 12/10/2019 07/13/2016 05/07/2016 09/02/2015  Does Patient Have a Medical Advance Directive? No No No No  Would patient like information on creating a medical advance directive? Yes (MAU/Ambulatory/Procedural Areas - Information given) No - Patient declined - No - patient declined information    Current Medications (verified) Outpatient Encounter Medications as of 12/10/2019  Medication Sig  . aspirin EC 81 MG tablet Take 81 mg by mouth daily.  . enalapril (VASOTEC) 20 MG tablet Take 1 tablet by mouth once daily  . lovastatin (MEVACOR) 40 MG tablet TAKE 1 TABLET BY MOUTH AT BEDTIME  . montelukast (SINGULAIR) 10 MG tablet TAKE 1 TABLET BY MOUTH AT BEDTIME  . pantoprazole (PROTONIX) 20 MG tablet Take 1 tablet by mouth  once daily  . sodium fluoride (FLUORISHIELD) 1.1 % GEL dental gel SF 5000 Plus 1.1 % dental cream  BRUSH THOROUGHLY ONCE DAILY AT BEDTIME  . verapamil (CALAN) 120 MG tablet Take 1 tablet by mouth once daily  . vitamin C (ASCORBIC ACID) 500 MG tablet Take 500 mg by mouth daily.  . vitamin E 400 UNIT capsule Take 400 Units by mouth daily.  .Marland Kitchenzolpidem (AMBIEN) 5 MG tablet TAKE 1/2 TO 1 (ONE-HALF TO ONE) TABLET BY MOUTH AT BEDTIME AS NEEDED FOR SLEEP   No facility-administered encounter medications on file as of 12/10/2019.    Allergies (verified) Codeine   History: Past Medical History:  Diagnosis Date  . Chicken pox   . Hypercholesteremia   . Hypertension   . Keratoconus    Left   Past Surgical History:  Procedure Laterality Date  . BREAST SURGERY     biopsy  . CORNEAL TRANSPLANT     Family History  Problem Relation Age of Onset  . Hyperlipidemia Mother   . Heart disease Mother   . Stroke Mother   . Hypertension Mother   . Diabetes Mother   . Hyperlipidemia Father   . Heart disease Father   . Stroke Father   . Hypertension Father   . Diabetes Father    Social History   Socioeconomic History  . Marital status: Married    Spouse name: PEddie Dibbles . Number of children: 0  . Years of education: Not on file  . Highest education level: Bachelor's degree (e.g., BA, AB, BS)  Occupational History  . Occupation: Educator  Tobacco Use  . Smoking status: Never Smoker  . Smokeless tobacco: Never Used  Substance and Sexual Activity  . Alcohol use: No  . Drug use: No  . Sexual activity: Not on file  Other Topics Concern  . Not on file  Social History Narrative   Married, lives with husband Eddie Dibbles in a 2 story home. Drinks 1/2 cup of coffee a day, rare soda or tea. Exercise regularly for at least 60 minutes.   Social Determinants of Health   Financial Resource Strain: Low Risk   . Difficulty of Paying Living Expenses: Not hard at all  Food Insecurity: No Food Insecurity  .  Worried About Charity fundraiser in the Last Year: Never true  . Ran Out of Food in the Last Year: Never true  Transportation Needs: No Transportation Needs  . Lack of Transportation (Medical): No  . Lack of Transportation (Non-Medical): No  Physical Activity: Sufficiently Active  . Days of Exercise per Week: 5 days  . Minutes of Exercise per Session: 60 min  Stress: No Stress Concern Present  . Feeling of Stress : Not at all  Social Connections: Moderately Integrated  . Frequency of Communication with Friends and Family: Twice a week  . Frequency of Social Gatherings with Friends and Family: Once a week  . Attends Religious Services: More than 4 times per year  . Active Member of Clubs or Organizations: No  . Attends Archivist Meetings: Never  . Marital Status: Married    Tobacco Counseling Counseling given: Not Answered   Clinical Intake:  Pre-visit preparation completed: Yes  Pain : No/denies pain     BMI - recorded: 28.06 Nutritional Status: BMI 25 -29 Overweight Nutritional Risks: None Diabetes: No  How often do you need to have someone help you when you read instructions, pamphlets, or other written materials from your doctor or pharmacy?: 1 - Never  Diabetic?No  Interpreter Needed?: No  Information entered by :: Charlott Rakes, LPN   Activities of Daily Living In your present state of health, do you have any difficulty performing the following activities: 12/10/2019  Hearing? N  Vision? N  Difficulty concentrating or making decisions? N  Walking or climbing stairs? N  Dressing or bathing? N  Doing errands, shopping? N  Preparing Food and eating ? N  Using the Toilet? N  In the past six months, have you accidently leaked urine? N  Do you have problems with loss of bowel control? N  Managing your Medications? N  Managing your Finances? N  Housekeeping or managing your Housekeeping? N  Some recent data might be hidden    Patient Care  Team: [place], Betty G, MD as PCP - General (Family Medicine) Earnie Larsson, [facility] as Pharmacist (Pharmacist)  Indicate any recent Medical Services you may have received from other than [facility] providers in the past year (date may be approximate).     Assessment:   This is a routine wellness examination for [place].  Hearing/Vision screen No exam data present  Dietary issues and exercise activities discussed: Current Exercise Habits: Home exercise routine, Type of exercise: strength training/weights, Time (Minutes): 60, Frequency (Times/Week): 5, Weekly Exercise (Minutes/Week): 300  Goals    . Patient Stated     Lose 10 lbs    . Pharmacy Care Plan     CARE PLAN ENTRY  Current Barriers:  . Chronic Disease Management support, education, and care coordination needs related to  HTN, HLD, and Cardiomyopathy, GERD, Insomnia, NASH  Controlled hypertension   Current antihypertensive regimen: Enalapril 20 mg 1 tablet daily  and Verapamil 120 mg 1 tablet daily . Previous antihypertensives tried: none  . Last practice recorded BP readings:  BP Readings from Last 3 Encounters:  05/26/19 126/80  04/24/19 136/90  11/26/18 130/64 .  Current home BP readings: 124-128/ 80-85 mmHg.  Uncontrolled hyperlipidemia . Current antihyperlipidemic regimen: Lovastatin 40 mg 1 tablet at bedtime . Previous antihyperlipidemic medications tried none  . Most recent lipid panel:     Component Value Date/Time   CHOL 193 11/26/2018 0822   TRIG 81.0 11/26/2018 0822   HDL 51.90 11/26/2018 0822   CHOLHDL 4 11/26/2018 0822   VLDL 16.2 11/26/2018 0822   LDLCALC 124 (H) 11/26/2018 0822  The 10-year ASCVD risk score Mikey Bussing DC Jr., et al., 2013) is: 6.1% Acid reflux / GERD . Current regimen: pantoprazole 16m, 1 tablet daily (currently taking 3 times per week).  Insomnia . Current regimen: Zolpidem 5 mg, 0.5 to 1 tablet at bedtime as needed for sleep NASH (non-alcoholic steatohepatitis)  . Current regimen:  Vitamin E 400 units 1 capsule daily  Allergy rhinitis . Current regimen: Montelukast 10 mg 1 tablet at bedtime  Pharmacist Clinical Goal(s):  .Marland KitchenOver the next 14 days, patient will work with PharmD and providers to optimize allergy rhinitis regimen.  . High blood pressure:  .Marland KitchenMaintain Blood pressure <130/80 mmHg  . High cholesterol:  . Cholesterol goals: Total Cholesterol goal under 200, Triglycerides goal under 150, HDL goal above 40 (men) or above 50 (women), LDL goal under 100.   Interventions: . Comprehensive medication review performed; medication list updated in electronic medical record.  .[facility] team collaboration  . High blood pressure:  . Discussed diet modifications. DASH diet:  following a diet emphasizing fruits and vegetables and low-fat dairy products along with whole grains, fish, poultry, and nuts. Reducing red meats and sugars.  . Exercising . Reducing the amount of salt intake to <15059mper day.  . Recommend using a salt substitute to replace your salt if you need flavor.      . Weight reduction- We discussed losing 5-10% of body weight . High cholesterol . How to reduce cholesterol through diet/weight management and physical activity. . We discussed how a diet high in plant sterols (fruits/vegetables/nuts/whole grains/legumes) may reduce your cholesterol. Encouraged increasing fiber to a daily intake of 10-25g/day  . Acid reflux . We discussed non-pharmacological interventions for acid reflux. Take measures to prevent acid reflux, such as avoiding spicy foods, avoiding caffeine, avoid laying down a few hours after eating, and raising the head of the bed . Allergies . Consulted with Dr. JoMartiniquePatient to take montelukast 1063muring allergy season (Can stop when not needed).  Patient Self Care Activities:  . Patient will continue to check BP daily , document, and provide at future appointments . Continue current medications as directed by providers.   . Continue following up with primary care provider and/or specialists. . Continue at home blood pressure readings. . Continue working on healthy habits (diet/ exercise).  Initial goal documentation       Depression Screen PHQ 2/9 Scores 12/10/2019 11/29/2019 11/28/2018 11/15/2016  PHQ - 2 Score 0 0 0 0    Fall Risk Fall Risk  11/28/2018  Falls in the past year? 0  Number falls in past yr: 0  Injury with Fall? 0  Follow up Education provided    Any  stairs in or around the home? Yes  If so, are there any without handrails? No  Home free of loose throw rugs in walkways, pet beds, electrical cords, etc? Yes  Adequate lighting in your home to reduce risk of falls? Yes   ASSISTIVE DEVICES UTILIZED TO PREVENT FALLS:  Life alert? No  Use of a cane, walker or w/c? No  Grab bars in the bathroom? No  Shower chair or bench in shower? Yes  Elevated toilet seat or a handicapped toilet? No   TIMED UP AND GO:  Was the test performed? No .      Cognitive Function:     6CIT Screen 12/10/2019  What Year? 0 points  What month? 0 points  Count back from 20 0 points  Months in reverse 0 points  Repeat phrase 0 points    Immunizations Immunization History  Administered Date(s) Administered  . Hep A / Hep B 11/29/2016  . Influenza, Seasonal, Injecte, Preservative Fre 12/23/2014  . Influenza,inj,Quad PF,6+ Mos 11/29/2016, 01/02/2018, 11/26/2018, 11/26/2019  . Influenza-Unspecified 12/23/2014  . PFIZER SARS-COV-2 Vaccination 05/22/2019, 06/16/2019  . PPD Test 11/29/2016  . Tdap 02/09/2012    TDAP status: Up to date Flu Vaccine status: Up to date Pneumococcal vaccine status: Declined,  Education has been provided regarding the importance of this vaccine but patient still declined. Advised may receive this vaccine at local pharmacy or Health Dept. Aware to provide a copy of the vaccination record if obtained from local pharmacy or Health Dept. Verbalized acceptance and understanding.   Covid-19 vaccine status: Completed vaccines  Qualifies for Shingles Vaccine? Yes   Zostavax completed No   Shingrix Completed?: No.    Education has been provided regarding the importance of this vaccine. Patient has been advised to call insurance company to determine out of pocket expense if they have not yet received this vaccine. Advised may also receive vaccine at local pharmacy or Health Dept. Verbalized acceptance and understanding.  Screening Tests Health Maintenance  Topic Date Due  . MAMMOGRAM  10/13/2020  . TETANUS/TDAP  02/08/2022  . PAP SMEAR-Modifier  10/14/2022  . COLONOSCOPY  10/04/2025  . INFLUENZA VACCINE  Completed  . COVID-19 Vaccine  Completed  . Hepatitis C Screening  Completed  . HIV Screening  Completed    Health Maintenance  There are no preventive care reminders to display for this patient.  Colorectal cancer screening: Completed 10/05/15. Repeat every 10 years Mammogram status: Completed 10/14/18. Repeat every year   Additional Screening:  Hepatitis C Screening:  Completed 08/17/14  Vision Screening: Recommended annual ophthalmology exams for early detection of glaucoma and other disorders of the eye. Is the patient up to date with their annual eye exam?  Yes  Who is the provider or what is the name of the office in which the patient attends annual eye exams? Dr Jodean Lima @ [facility]  Dental Screening: Recommended annual dental exams for proper oral hygiene  Community Resource Referral / Chronic Care Management: CRR required this visit?  No   CCM required this visit?  No      Plan:     I have personally reviewed and noted the following in the patient's chart:   . Medical and social history . Use of alcohol, tobacco or illicit drugs  . Current medications and supplements . Functional ability and status . Nutritional status . Physical activity . Advanced directives . List of other physicians . Hospitalizations, surgeries, and ER visits  in previous 12 months .  Vitals . Screenings to include cognitive, depression, and falls . Referrals and appointments  In addition, I have reviewed and discussed with patient certain preventive protocols, quality metrics, and best practice recommendations. A written personalized care plan for preventive services as well as general preventive health recommendations were provided to patient.     Willette Brace, LPN   [id number]   Nurse Notes: None

## 2019-12-10 NOTE — Patient Instructions (Addendum)
Latoya Thompson , Thank you for taking time to come for your Medicare Wellness Visit. I appreciate your ongoing commitment to your health goals. Please review the following plan we discussed and let me know if I can assist you in the future.   Screening recommendations/referrals: Colonoscopy: Done 10/05/15 Mammogram: Done 10/14/18 Recommended yearly ophthalmology/optometry visit for glaucoma screening and checkup Recommended yearly dental visit for hygiene and checkup  Vaccinations: Influenza vaccine: Up to date Tdap vaccine: Up to date Shingles vaccine: Shingrix discussed. Please contact your pharmacy for coverage information.   Covid-19: Completed 3/11 & 06/16/19  Advanced directives: Advance directive discussed with you today. I have provided a copy for you to complete at home and have notarized. Once this is complete please bring a copy in to our office so we can scan it into your chart.  Conditions/risks identified: lose 10lbs   Next appointment: Follow up in one year for your annual wellness visit.   Preventive Care 40-64 Years, Female Preventive care refers to lifestyle choices and visits with your health care provider that can promote health and wellness. What does preventive care include?  A yearly physical exam. This is also called an annual well check.  Dental exams once or twice a year.  Routine eye exams. Ask your health care provider how often you should have your eyes checked.  Personal lifestyle choices, including:  Daily care of your teeth and gums.  Regular physical activity.  Eating a healthy diet.  Avoiding tobacco and drug use.  Limiting alcohol use.  Practicing safe sex.  Taking low-dose aspirin daily starting at age 28.  Taking vitamin and mineral supplements as recommended by your health care provider. What happens during an annual well check? The services and screenings done by your health care provider during your annual well check will depend on your  age, overall health, lifestyle risk factors, and family history of disease. Counseling  Your health care provider may ask you questions about your:  Alcohol use.  Tobacco use.  Drug use.  Emotional well-being.  Home and relationship well-being.  Sexual activity.  Eating habits.  Work and work Statistician.  Method of birth control.  Menstrual cycle.  Pregnancy history. Screening  You may have the following tests or measurements:  Height, weight, and BMI.  Blood pressure.  Lipid and cholesterol levels. These may be checked every 5 years, or more frequently if you are over 13 years old.  Skin check.  Lung cancer screening. You may have this screening every year starting at age 21 if you have a 30-pack-year history of smoking and currently smoke or have quit within the past 15 years.  Fecal occult blood test (FOBT) of the stool. You may have this test every year starting at age 63.  Flexible sigmoidoscopy or colonoscopy. You may have a sigmoidoscopy every 5 years or a colonoscopy every 10 years starting at age 15.  Hepatitis C blood test.  Hepatitis B blood test.  Sexually transmitted disease (STD) testing.  Diabetes screening. This is done by checking your blood sugar (glucose) after you have not eaten for a while (fasting). You may have this done every 1-3 years.  Mammogram. This may be done every 1-2 years. Talk to your health care provider about when you should start having regular mammograms. This may depend on whether you have a family history of breast cancer.  BRCA-related cancer screening. This may be done if you have a family history of breast, ovarian, tubal, or peritoneal cancers.  Pelvic exam and Pap test. This may be done every 3 years starting at age 62. Starting at age 70, this may be done every 5 years if you have a Pap test in combination with an HPV test.  Bone density scan. This is done to screen for osteoporosis. You may have this scan if you  are at high risk for osteoporosis. Discuss your test results, treatment options, and if necessary, the need for more tests with your health care provider. Vaccines  Your health care provider may recommend certain vaccines, such as:  Influenza vaccine. This is recommended every year.  Tetanus, diphtheria, and acellular pertussis (Tdap, Td) vaccine. You may need a Td booster every 10 years.  Zoster vaccine. You may need this after age 6.  Pneumococcal 13-valent conjugate (PCV13) vaccine. You may need this if you have certain conditions and were not previously vaccinated.  Pneumococcal polysaccharide (PPSV23) vaccine. You may need one or two doses if you smoke cigarettes or if you have certain conditions. Talk to your health care provider about which screenings and vaccines you need and how often you need them. This information is not intended to replace advice given to you by your health care provider. Make sure you discuss any questions you have with your health care provider. Document Released: 03/26/2015 Document Revised: 11/17/2015 Document Reviewed: 12/29/2014 Elsevier Interactive Patient Education  2017 [place] Prevention in the Home Falls can cause injuries. They can happen to people of all ages. There are many things you can do to make your home safe and to help prevent falls. What can I do on the outside of my home?  Regularly fix the edges of walkways and driveways and fix any cracks.  Remove anything that might make you trip as you walk through a door, such as a raised step or threshold.  Trim any bushes or trees on the path to your home.  Use bright outdoor lighting.  Clear any walking paths of anything that might make someone trip, such as rocks or tools.  Regularly check to see if handrails are loose or broken. Make sure that both sides of any steps have handrails.  Any raised decks and porches should have guardrails on the edges.  Have any leaves,  snow, or ice cleared regularly.  Use sand or salt on walking paths during winter.  Clean up any spills in your garage right away. This includes oil or grease spills. What can I do in the bathroom?  Use night lights.  Install grab bars by the toilet and in the tub and shower. Do not use towel bars as grab bars.  Use non-skid mats or decals in the tub or shower.  If you need to sit down in the shower, use a plastic, non-slip stool.  Keep the floor dry. Clean up any water that spills on the floor as soon as it happens.  Remove soap buildup in the tub or shower regularly.  Attach bath mats securely with double-sided non-slip rug tape.  Do not have throw rugs and other things on the floor that can make you trip. What can I do in the bedroom?  Use night lights.  Make sure that you have a light by your bed that is easy to reach.  Do not use any sheets or blankets that are too big for your bed. They should not hang down onto the floor.  Have a firm chair that has side arms. You can use this for  support while you get dressed.  Do not have throw rugs and other things on the floor that can make you trip. What can I do in the kitchen?  Clean up any spills right away.  Avoid walking on wet floors.  Keep items that you use a lot in easy-to-reach places.  If you need to reach something above you, use a strong step stool that has a grab bar.  Keep electrical cords out of the way.  Do not use floor polish or wax that makes floors slippery. If you must use wax, use non-skid floor wax.  Do not have throw rugs and other things on the floor that can make you trip. What can I do with my stairs?  Do not leave any items on the stairs.  Make sure that there are handrails on both sides of the stairs and use them. Fix handrails that are broken or loose. Make sure that handrails are as long as the stairways.  Check any carpeting to make sure that it is firmly attached to the stairs. Fix any  carpet that is loose or worn.  Avoid having throw rugs at the top or bottom of the stairs. If you do have throw rugs, attach them to the floor with carpet tape.  Make sure that you have a light switch at the top of the stairs and the bottom of the stairs. If you do not have them, ask someone to add them for you. What else can I do to help prevent falls?  Wear shoes that:  Do not have high heels.  Have rubber bottoms.  Are comfortable and fit you well.  Are closed at the toe. Do not wear sandals.  If you use a stepladder:  Make sure that it is fully opened. Do not climb a closed stepladder.  Make sure that both sides of the stepladder are locked into place.  Ask someone to hold it for you, if possible.  Clearly mark and make sure that you can see:  Any grab bars or handrails.  First and last steps.  Where the edge of each step is.  Use tools that help you move around (mobility aids) if they are needed. These include:  Canes.  Walkers.  Scooters.  Crutches.  Turn on the lights when you go into a dark area. Replace any light bulbs as soon as they burn out.  Set up your furniture so you have a clear path. Avoid moving your furniture around.  If any of your floors are uneven, fix them.  If there are any pets around you, be aware of where they are.  Review your medicines with your doctor. Some medicines can make you feel dizzy. This can increase your chance of falling. Ask your doctor what other things that you can do to help prevent falls. This information is not intended to replace advice given to you by your health care provider. Make sure you discuss any questions you have with your health care provider. Document Released: 12/24/2008 Document Revised: 08/05/2015 Document Reviewed: 04/03/2014 Elsevier Interactive Patient Education  2017 [employer].

## 2019-12-15 DIAGNOSIS — Z20822 Contact with and (suspected) exposure to covid-19: Secondary | ICD-10-CM | POA: Diagnosis not present

## 2019-12-15 DIAGNOSIS — Z03818 Encounter for observation for suspected exposure to other biological agents ruled out: Secondary | ICD-10-CM | POA: Diagnosis not present

## 2020-01-13 ENCOUNTER — Encounter: Payer: Self-pay | Admitting: Family Medicine

## 2020-01-19 ENCOUNTER — Other Ambulatory Visit: Payer: Self-pay | Admitting: Family Medicine

## 2020-01-19 DIAGNOSIS — Z0189 Encounter for other specified special examinations: Secondary | ICD-10-CM

## 2020-01-19 NOTE — Progress Notes (Signed)
atien

## 2020-01-25 ENCOUNTER — Other Ambulatory Visit: Payer: Self-pay | Admitting: Family Medicine

## 2020-02-09 ENCOUNTER — Other Ambulatory Visit: Payer: Self-pay | Admitting: Family Medicine

## 2020-02-09 DIAGNOSIS — I10 Essential (primary) hypertension: Secondary | ICD-10-CM

## 2020-03-08 ENCOUNTER — Telehealth: Payer: Self-pay | Admitting: Pharmacist

## 2020-03-08 NOTE — Chronic Care Management (AMB) (Signed)
    Chronic Care Management Pharmacy Assistant   Name: Latoya Thompson  MRN: [id number] DOB: 03-Aug-1960  Reason for Encounter:  General Adherence   PCP : Martinique, Betty G, MD  Allergies:   Allergies  Allergen Reactions  . Codeine Itching    Medications: Outpatient Encounter Medications as of 03/08/2020  Medication Sig  . aspirin EC 81 MG tablet Take 81 mg by mouth daily.  . enalapril (VASOTEC) 20 MG tablet Take 1 tablet by mouth once daily  . lovastatin (MEVACOR) 40 MG tablet TAKE 1 TABLET BY MOUTH AT BEDTIME  . montelukast (SINGULAIR) 10 MG tablet TAKE 1 TABLET BY MOUTH AT BEDTIME  . pantoprazole (PROTONIX) 20 MG tablet Take 1 tablet by mouth once daily  . sodium fluoride (FLUORISHIELD) 1.1 % GEL dental gel SF 5000 Plus 1.1 % dental cream  BRUSH THOROUGHLY ONCE DAILY AT BEDTIME  . verapamil (CALAN) 120 MG tablet Take 1 tablet by mouth once daily  . vitamin C (ASCORBIC ACID) 500 MG tablet Take 500 mg by mouth daily.  . vitamin E 400 UNIT capsule Take 400 Units by mouth daily.  Marland Kitchen zolpidem (AMBIEN) 5 MG tablet TAKE 1/2 TO 1 (ONE-HALF TO ONE) TABLET BY MOUTH AT BEDTIME AS NEEDED FOR SLEEP   No facility-administered encounter medications on file as of 03/08/2020.    Current Diagnosis: Patient Active Problem List   Diagnosis Date Noted  . Cardiomyopathy, unspecified type ([place]) 05/29/2019  . GERD (gastroesophageal reflux disease) 07/03/2018  . Insomnia 07/03/2018  . Tremor, unspecified 01/02/2018  . Tinnitus, right ear 01/02/2018  . NASH (nonalcoholic steatohepatitis) 05/18/2016  . Overweight (BMI 25.0-29.9) 05/18/2016  . 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 LBBB (left bundle branch block) 07/04/2013  . Chest pain 07/04/2013  . Hypertension   . Hypercholesteremia     Goals Addressed   None     Follow-Up:  Pharmacist Review  I called the patient and discussed medication adherence with the patient, no issues at this time with current medication. She states she  has been doing well. She has continued to take her medication as written.  She has been working on healthy eating habits. She denies ED visits since his last CPP follow-up. She also denies any side effects with his medication and any problems with his current pharmacy.  Maia Breslow, [place] Assistant [phone]

## 2020-03-14 ENCOUNTER — Other Ambulatory Visit: Payer: Self-pay | Admitting: Family Medicine

## 2020-03-14 DIAGNOSIS — I10 Essential (primary) hypertension: Secondary | ICD-10-CM

## 2020-03-18 NOTE — Telephone Encounter (Signed)
Pt is calling in stating that she needs a refill on Rx verapamil (CALAN) 120 MG  Pharm:  [facility] on [address]  Pt is aware that the provider is out of the office and we will see if someone can assist with this.

## 2020-04-07 DIAGNOSIS — Z20822 Contact with and (suspected) exposure to covid-19: Secondary | ICD-10-CM | POA: Diagnosis not present

## 2020-04-07 DIAGNOSIS — Z03818 Encounter for observation for suspected exposure to other biological agents ruled out: Secondary | ICD-10-CM | POA: Diagnosis not present

## 2020-04-22 ENCOUNTER — Other Ambulatory Visit (HOSPITAL_COMMUNITY): Payer: PPO

## 2020-04-24 DIAGNOSIS — N390 Urinary tract infection, site not specified: Secondary | ICD-10-CM | POA: Diagnosis not present

## 2020-04-25 ENCOUNTER — Other Ambulatory Visit: Payer: Self-pay | Admitting: Family Medicine

## 2020-04-27 DIAGNOSIS — N764 Abscess of vulva: Secondary | ICD-10-CM | POA: Diagnosis not present

## 2020-04-29 ENCOUNTER — Telehealth: Payer: PPO | Admitting: Cardiovascular Disease

## 2020-05-05 DIAGNOSIS — A609 Anogenital herpesviral infection, unspecified: Secondary | ICD-10-CM | POA: Diagnosis not present

## 2020-05-05 NOTE — Progress Notes (Signed)
Date:  05/10/2020   ID:  Latoya Thompson, DOB March 10, 1961, MRN [id number]  PCP:  Martinique, Betty G, MD  Cardiologist:   Johnsie Cancel Electrophysiologist:  None   Evaluation Performed:  Follow-Up Visit  History of Present Illness:    60 y.o.history of blindness, LBBB, HTN, HLD DM-2 and mild DCM with EF 45-50% by echo 05/01/16 stable since echo 2015.  She had myovue 07/22/13 which was non ischemic She complained of dyspnea but BNP was normal at 24.  Carotid duplex done for tinnitus showed plaque no stenosis 01/01/18 She is on disability due to her blindness.  Also with some left sided tremors   No cardiac complaints Compliant with meds  Has been vaccinated   TTE 05/10/2020 stable EF 45-50% mild MR abnormal septal motion mild LVH  Functional class one no symptoms Compliant with meds   Past Medical History:  Diagnosis Date  . Chicken pox   . Hypercholesteremia   . Hypertension   . Keratoconus    Left   Past Surgical History:  Procedure Laterality Date  . BREAST SURGERY     biopsy  . CORNEAL TRANSPLANT       Current Meds  Medication Sig  . aspirin EC 81 MG tablet Take 81 mg by mouth daily.  . enalapril (VASOTEC) 20 MG tablet Take 1 tablet by mouth once daily  . lovastatin (MEVACOR) 40 MG tablet TAKE 1 TABLET BY MOUTH AT BEDTIME  . montelukast (SINGULAIR) 10 MG tablet TAKE 1 TABLET BY MOUTH AT BEDTIME  . pantoprazole (PROTONIX) 20 MG tablet Take 1 tablet by mouth once daily  . sodium fluoride (FLUORISHIELD) 1.1 % GEL dental gel SF 5000 Plus 1.1 % dental cream  BRUSH THOROUGHLY ONCE DAILY AT BEDTIME  . verapamil (CALAN) 120 MG tablet Take 1 tablet by mouth once daily  . vitamin C (ASCORBIC ACID) 500 MG tablet Take 500 mg by mouth daily.  . vitamin E 400 UNIT capsule Take 400 Units by mouth daily.  Marland Kitchen zolpidem (AMBIEN) 5 MG tablet TAKE 1/2 TO 1 (ONE-HALF TO ONE) TABLET BY MOUTH AT BEDTIME AS NEEDED FOR SLEEP     Allergies:   Codeine   Social History   Tobacco Use  . Smoking  status: Never Smoker  . Smokeless tobacco: Never Used  Substance Use Topics  . Alcohol use: No  . Drug use: No     Family Hx: The patient's family history includes Diabetes in her father and mother; Heart disease in her father and mother; Hyperlipidemia in her father and mother; Hypertension in her father and mother; Stroke in her father and mother.  ROS:   Please see the history of present illness.     All other systems reviewed and are negative.   Prior CV studies:   The following studies were reviewed today:  Carotid Duplex 12/27/17 Echo: 05/01/16 Myovue 07/22/13   Labs/Other Tests and Data Reviewed:    EKG:  07/14/16 SR LBBB 05/10/2020 SR Rate 68 LBBB no changes   Recent Labs: 11/26/2019: ALT 13; BUN 17; Creat 1.02; Potassium 4.6; Sodium 142   Recent Lipid Panel Lab Results  Component Value Date/Time   CHOL 195 11/26/2019 08:31 AM   TRIG 77 11/26/2019 08:31 AM   HDL 54 11/26/2019 08:31 AM   CHOLHDL 3.6 11/26/2019 08:31 AM   LDLCALC 124 (H) 11/26/2019 08:31 AM    Wt Readings from Last 3 Encounters:  05/10/20 73.6 kg  11/26/19 71.8 kg  05/26/19 71.7 kg  Objective:    Vital Signs:  BP 120/90   Pulse 68   Ht 5' 3"  (1.6 m)   Wt 73.6 kg   BMI 28.73 kg/m    Affect appropriate Healthy:  appears stated age HEENT:Blind  Neck supple with no adenopathy JVP normal no bruits no thyromegaly Lungs clear with no wheezing and good diaphragmatic motion Heart:  S1/S2 no murmur, no rub, gallop or click PMI normal Abdomen: benighn, BS positve, no tenderness, no AAA no bruit.  No HSM or HJR Distal pulses intact with no bruits No edema Neuro non-focal Skin warm and dry No muscular weakness    ASSESSMENT & PLAN:    1. DCM:  Stable EF over years 45-50% 05/10/2020 TTE stable  2. LBBB: chronic f/u ECG in a year  3. HTN:  Well controlled continue vasotec 4. HLD:  On statin labs with primary  5. Tinnitus :  F/u with primary and ENT as well as her blindness and tremors  Suggested She get at least a CT head and possibly MRI  COVID-19 Education: The signs and symptoms of COVID-19 were discussed with the patient and how to seek care for testing (follow up with PCP or arrange E-visit).  The importance of social distancing was discussed today.  Medication Adjustments/Labs and Tests Ordered: Current medicines are reviewed at length with the patient today.  Concerns regarding medicines are outlined above.   Tests Ordered:  None   Medication Changes:  None   Disposition:  Follow up in a year  Signed, Jenkins Rouge, MD  05/10/2020 2:50 PM    [facility]

## 2020-05-10 ENCOUNTER — Ambulatory Visit: Payer: PPO | Admitting: Cardiovascular Disease

## 2020-05-10 ENCOUNTER — Other Ambulatory Visit: Payer: Self-pay

## 2020-05-10 ENCOUNTER — Encounter: Payer: Self-pay | Admitting: Cardiovascular Disease

## 2020-05-10 ENCOUNTER — Ambulatory Visit (HOSPITAL_COMMUNITY): Payer: PPO | Attending: Cardiology

## 2020-05-10 VITALS — BP 120/90 | HR 68 | Ht 63.0 in | Wt 162.2 lb

## 2020-05-10 DIAGNOSIS — I447 Left bundle-branch block, unspecified: Secondary | ICD-10-CM

## 2020-05-10 DIAGNOSIS — I420 Dilated cardiomyopathy: Secondary | ICD-10-CM | POA: Diagnosis not present

## 2020-05-10 DIAGNOSIS — I10 Essential (primary) hypertension: Secondary | ICD-10-CM | POA: Diagnosis not present

## 2020-05-10 LAB — ECHOCARDIOGRAM COMPLETE
Area-P 1/2: 2.55 cm2
S' Lateral: 2.9 cm

## 2020-05-10 NOTE — Patient Instructions (Addendum)
Medication Instructions:  *If you need a refill on your cardiac medications before your next appointment, please call your pharmacy*  Lab Work: If you have labs (blood work) drawn today and your tests are completely normal, you will receive your results only by: Marland Kitchen MyChart Message (if you have MyChart) OR . A paper copy in the mail If you have any lab test that is abnormal or we need to change your treatment, we will call you to review the results.  Testing/Procedures: None ordered today.  Follow-Up: At [facility], you and your health needs are our priority.  As part of our continuing mission to provide you with exceptional heart care, we have created designated Provider Care Teams.  These Care Teams include your primary Cardiologist (physician) and Advanced Practice Providers (APPs -  Physician Assistants and Nurse Practitioners) who all work together to provide you with the care you need, when you need it.  We recommend signing up for the patient portal called "MyChart".  Sign up information is provided on this After Visit Summary.  MyChart is used to connect with patients for Virtual Visits (Telemedicine).  Patients are able to view lab/test results, encounter notes, upcoming appointments, etc.  Non-urgent messages can be sent to your provider as well.   To learn more about what you can do with MyChart, go to [email or web address].    Your next appointment:   1 year(s)  The format for your next appointment:   In Person  Provider:   You may see Dr. Johnsie Cancel or one of the following Advanced Practice Providers on your designated Care Team:    Kathyrn Drown, NP

## 2020-05-19 ENCOUNTER — Encounter: Payer: Self-pay | Admitting: Family Medicine

## 2020-06-13 ENCOUNTER — Other Ambulatory Visit: Payer: Self-pay | Admitting: Family Medicine

## 2020-06-13 DIAGNOSIS — I10 Essential (primary) hypertension: Secondary | ICD-10-CM

## 2020-06-28 ENCOUNTER — Telehealth: Payer: Self-pay | Admitting: Pharmacist

## 2020-06-28 NOTE — Chronic Care Management (AMB) (Signed)
I spoke with the patient about her upcoming appointment on 04.19.2022 @ 8:00 am with the clinical pharmacist. She was asked to please have all medication on hand to review with the pharmacist. She confirmed the appointment.   Latoya Thompson, [place] [phone]

## 2020-06-29 ENCOUNTER — Ambulatory Visit (INDEPENDENT_AMBULATORY_CARE_PROVIDER_SITE_OTHER): Payer: PPO | Admitting: Pharmacist

## 2020-06-29 DIAGNOSIS — I10 Essential (primary) hypertension: Secondary | ICD-10-CM

## 2020-06-29 DIAGNOSIS — K219 Gastro-esophageal reflux disease without esophagitis: Secondary | ICD-10-CM

## 2020-06-29 NOTE — Progress Notes (Signed)
Chronic Care Management Pharmacy Note  07/08/2020 Name:  Latoya Thompson MRN:  [id number] DOB:  Apr 17, 1960  Subjective: Latoya Thompson is an 60 y.o. year old female who is a primary patient of Martinique, Malka So, MD.  The CCM team was consulted for assistance with disease management and care coordination needs.    Engaged with patient by telephone for follow up visit in response to provider referral for pharmacy case management and/or care coordination services.   Consent to Services:  The patient was given information about Chronic Care Management services, agreed to services, and gave verbal consent prior to initiation of services.  Please see initial visit note for detailed documentation.   Patient Care Team: Martinique, Betty G, MD as PCP - General (Family Medicine) Viona Gilmore, [facility] as Pharmacist (Pharmacist)  Recent office visits: 12/10/19 Charlott Rakes, LPN: Patient presented for AWV.  11/26/19 Betty Martinique, MD: Patient presented for annual exam. LDL increased. DEXA ordered. No medication changes made.  Recent consult visits: 05/10/20 Jenkins Rouge, MD (cardiology): Patient presented for cardiomyopathy follow up. No medication changes made.  05/05/20 Rosalea Taam-Akelman (OBGYN): Unable to access notes.  Hospital visits: None in previous 6 months  Objective:  Lab Results  Component Value Date   CREATININE 1.02 11/26/2019   BUN 17 11/26/2019   GFR 64.91 05/26/2019   GFRNONAA 60 11/26/2019   GFRAA 70 11/26/2019   NA 142 11/26/2019   K 4.6 11/26/2019   CALCIUM 9.8 11/26/2019   CO2 29 11/26/2019   GLUCOSE 91 11/26/2019    Lab Results  Component Value Date/Time   GFR 64.91 05/26/2019 08:01 AM   GFR 74.80 11/26/2018 08:22 AM    Last diabetic Eye exam: No results found for: HMDIABEYEEXA  Last diabetic Foot exam: No results found for: HMDIABFOOTEX   Lab Results  Component Value Date   CHOL 195 11/26/2019   HDL 54 11/26/2019   LDLCALC 124 (H) 11/26/2019   TRIG 77  11/26/2019   CHOLHDL 3.6 11/26/2019    Hepatic Function Latest Ref Rng & Units 11/26/2019 05/26/2019 11/26/2018  Total Protein 6.1 - 8.1 g/dL 7.4 7.3 7.3  Albumin 3.5 - 5.2 g/dL - 4.3 4.4  AST 10 - 35 U/L 19 22 19   ALT 6 - 29 U/L 13 15 13   Alk Phosphatase 39 - 117 U/L - 121(H) 114  Total Bilirubin 0.2 - 1.2 mg/dL 0.7 0.5 0.6  Bilirubin, Direct 0.0 - 0.3 mg/dL - - -    Lab Results  Component Value Date/Time   TSH 0.76 01/04/2018 07:40 AM    CBC Latest Ref Rng & Units 07/13/2016 09/02/2015  WBC 4.0 - 10.5 K/uL 7.1 5.9  Hemoglobin 12.0 - 15.0 g/dL 15.5(H) 14.4  Hematocrit 36.0 - 46.0 % 45.8 42.2  Platelets 150 - 400 K/uL 195 168    No results found for: VD25OH  Clinical ASCVD: No  The 10-year ASCVD risk score Mikey Bussing DC Jr., et al., 2013) is: 5.6%   Values used to calculate the score:     Age: 80 years     Sex: Female     Is Non-Hispanic African American: Yes     Diabetic: No     Tobacco smoker: No     Systolic Blood Pressure: 920 mmHg     Is BP treated: Yes     HDL Cholesterol: 54 mg/dL     Total Cholesterol: 195 mg/dL    Depression screen [facility] 2/9 12/10/2019 11/29/2019 11/28/2018  Decreased Interest 0  0 0  Down, Depressed, Hopeless 0 0 0  PHQ - 2 Score 0 0 0      Social History   Tobacco Use  Smoking Status Never Smoker  Smokeless Tobacco Never Used   BP Readings from Last 3 Encounters:  05/10/20 120/90  11/26/19 124/74  05/26/19 126/80   Pulse Readings from Last 3 Encounters:  05/10/20 68  11/26/19 93  05/26/19 80   Wt Readings from Last 3 Encounters:  05/10/20 162 lb 3.2 oz (73.6 kg)  11/26/19 158 lb 6 oz (71.8 kg)  05/26/19 158 lb (71.7 kg)   BMI Readings from Last 3 Encounters:  05/10/20 28.73 kg/m  11/26/19 28.05 kg/m  05/26/19 27.99 kg/m    Assessment/Interventions: Review of patient past medical history, allergies, medications, health status, including review of consultants reports, laboratory and other test data, was performed as part of  comprehensive evaluation and provision of chronic care management services.   SDOH:  (Social Determinants of Health) assessments and interventions performed: No  SDOH Screenings   Alcohol Screen: Not on file  Depression (PHQ2-9): Low Risk   . PHQ-2 Score: 0  Financial Resource Strain: Low Risk   . Difficulty of Paying Living Expenses: Not hard at all  Food Insecurity: No Food Insecurity  . Worried About Charity fundraiser in the Last Year: Never true  . Ran Out of Food in the Last Year: Never true  Housing: Low Risk   . Last Housing Risk Score: 0  Physical Activity: Sufficiently Active  . Days of Exercise per Week: 5 days  . Minutes of Exercise per Session: 60 min  Social Connections: Moderately Integrated  . Frequency of Communication with Friends and Family: Twice a week  . Frequency of Social Gatherings with Friends and Family: Once a week  . Attends Religious Services: More than 4 times per year  . Active Member of Clubs or Organizations: No  . Attends Archivist Meetings: Never  . Marital Status: Married  Stress: No Stress Concern Present  . Feeling of Stress : Not at all  Tobacco Use: Low Risk   . Smoking Tobacco Use: Never Smoker  . Smokeless Tobacco Use: Never Used  Transportation Needs: No Transportation Needs  . Lack of Transportation (Medical): No  . Lack of Transportation (Non-Medical): No    CCM Care Plan  Allergies  Allergen Reactions  . Codeine Itching    Medications Reviewed Today    Reviewed by Tamsen Snider (Certified Medical Assistant) on 05/10/20 at 1443  Med List Status: <None>  Medication Order Taking? Sig Documenting Provider Last Dose Status Informant  aspirin EC 81 MG tablet [id number] Yes Take 81 mg by mouth daily. [provider] Taking Active   enalapril (VASOTEC) 20 MG tablet [id number] Yes Take 1 tablet by mouth once daily Martinique, Betty G, MD Taking Active   lovastatin (MEVACOR) 40 MG tablet [id number] Yes TAKE 1  TABLET BY MOUTH AT BEDTIME Martinique, Betty G, MD Taking Active   montelukast (SINGULAIR) 10 MG tablet [id number] Yes TAKE 1 TABLET BY MOUTH AT BEDTIME Martinique, Betty G, MD Taking Active   pantoprazole (PROTONIX) 20 MG tablet [id number] Yes Take 1 tablet by mouth once daily Martinique, Betty G, MD Taking Active   sodium fluoride (FLUORISHIELD) 1.1 % GEL dental gel [id number] Yes SF 5000 Plus 1.1 % dental cream  BRUSH THOROUGHLY ONCE DAILY AT BEDTIME [provider] Taking Active   verapamil (CALAN) 120 MG tablet [id number] Yes Take 1  tablet by mouth once daily Martinique, Betty G, MD Taking Active   vitamin C (ASCORBIC ACID) 500 MG tablet [id number] Yes Take 500 mg by mouth daily. [provider] Taking Active Self  vitamin E 400 UNIT capsule [id number] Yes Take 400 Units by mouth daily. [provider] Taking Active   zolpidem (AMBIEN) 5 MG tablet [id number] Yes TAKE 1/2 TO 1 (ONE-HALF TO ONE) TABLET BY MOUTH AT BEDTIME AS NEEDED FOR SLEEP Martinique, Betty G, MD Taking Active           Patient Active Problem List   Diagnosis Date Noted  . Cardiomyopathy, unspecified type ([place]) 05/29/2019  . GERD (gastroesophageal reflux disease) 07/03/2018  . Insomnia 07/03/2018  . Tremor, unspecified 01/02/2018  . Tinnitus, right ear 01/02/2018  . NASH (nonalcoholic steatohepatitis) 05/18/2016  . Overweight (BMI 25.0-29.9) 05/18/2016  . 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 LBBB (left bundle branch block) 07/04/2013  . Chest pain 07/04/2013  . Hypertension   . Hypercholesteremia     Immunization History  Administered Date(s) Administered  . Hep A / Hep B 11/29/2016  . Influenza, Seasonal, Injecte, Preservative Fre 12/23/2014  . Influenza,inj,Quad PF,6+ Mos 11/29/2016, 01/02/2018, 11/26/2018, 11/26/2019  . Influenza-Unspecified 12/23/2014  . PFIZER(Purple Top)SARS-COV-2 Vaccination 05/22/2019, 06/16/2019  . PPD Test 11/29/2016  . Tdap 02/09/2012    Conditions to be  addressed/monitored:  Hypertension, Hyperlipidemia, GERD and Insomnia  Care Plan : CCM Pharmacy Care Plan  Updates made by Viona Gilmore, [place] since 07/08/2020 12:00 AM    Problem: Problem: Hypertension, Hyperlipidemia, GERD and Insomnia     Long-Range Goal: Patient-Specific Goal   Start Date: 06/29/2020  Expected End Date: 06/29/2021  This Visit's Progress: On track  Priority: High  Note:   Current Barriers:  . Unable to achieve control of cholesterol   Pharmacist Clinical Goal(s):  Marland Kitchen Patient will achieve control of cholesterol as evidenced by next lipid panel  through collaboration with PharmD and provider.   Interventions: . 1:1 collaboration with Martinique, Betty G, MD regarding development and update of comprehensive plan of care as evidenced by provider attestation and co-signature . Inter-disciplinary care team collaboration (see longitudinal plan of care) . Comprehensive medication review performed; medication list updated in electronic medical record  Hypertension (BP goal <130/80) -Controlled -Current treatment:  Enalapril 20 mg 1 tablet daily (in AM)   Verapamil 120 mg 1 tablet daily (in AM) -Medications previously tried: none  -Current home readings: 120-125/78-84 (daily or every other day) -Current dietary habits: doesn't eat a lot of salt, reading package labels; doesn't eat a lot of frozen foods (except vegetables) -Current exercise habits: reports exercising every morning at 5am, walking/cardio/weights/Zumba -Denies hypotensive/hypertensive symptoms -Educated on Daily salt intake goal < 2300 mg; Exercise goal of 150 minutes per week; Importance of home blood pressure monitoring; Proper BP monitoring technique; -Counseled to monitor BP at home weekly, document, and provide log at future appointments -Counseled on diet and exercise extensively Recommended to continue current medication  Hyperlipidemia: (LDL goal < 100) -Uncontrolled -Current  treatment:  Lovastatin 40 mg 1 tablet at bedtime -Medications previously tried: none  -Current dietary patterns: reports eating red meats: once or twice a week. She and her spouse eat more poultry than red meats (she reports spouse had a transplant and they are being careful on what they eat)  -Current exercise habits: reports exercising every morning at 5am, walking/ cardio/ weights/ Zumba  -Educated on Cholesterol goals;  Benefits of statin for ASCVD risk reduction; Importance  of limiting foods high in cholesterol; Exercise goal of 150 minutes per week; -Counseled on diet and exercise extensively Recommended to continue current medication Counseled on importance of timing of lovastatin and recommended switching to an alternative statin that timing does not matter  Prevention of ASCVD (Goal: prevent heart attacks and strokes) -Controlled -Current treatment  . Aspirin EC 81 mg tablet daily -Medications previously tried: none  - Patient reports due to family history of DM, HTN, and stroke, her cardiologist put on this a couple of years back for prevention  GERD (Goal: minimize symptoms) -Controlled -Current treatment  . Pantoprazole 20 mg 1 tablet daily (twice weekly) -Medications previously tried: none  -Counseled on continuing to taper off the medication as patient did not notice a difference in symptoms when she went from three times a week to twice Recommended taking medication on days she is eating food that will cause heartburn such as spaghetti  Insomnia (Goal: improve quality and quantity of sleep) -Controlled -Current treatment   Zolpidem 5 mg, 0.5 to 1 tablet at bedtime as needed for sleep (rarely takes it - once every 2 weeks or 3 weeks) -Medications previously tried: none -Recommended to continue current medication Patient takes very sparingly sometimes cutting it into 1/4 tablets  Allergic rhinitis (Goal: minimize symptoms) -Controlled -Current treatment   . Montelukast 10 mg 1 tablet at bedtime as needed -Medications previously tried: none  -Recommended to continue current medication Patient is not sure this is helping and will trial a period without it to see if she notices a difference  Health Maintenance -Vaccine gaps: shingles -Current therapy:   Vitamin E 400 units 1 capsule daily (for NASH)  Vitamin C 500 mg 1 tablet daily  Sodium flouride 1.1% dental gel brush thoroughly at bedtime -Educated on Cost vs benefit of each product must be carefully weighed by individual consumer -Patient is satisfied with current therapy and denies issues -Recommended to continue current medication  Patient Goals/Self-Care Activities . Patient will:  - take medications as prescribed check blood pressure weekly, document, and provide at future appointments target a minimum of 150 minutes of moderate intensity exercise weekly Set a timer for lovastatin to be taken in the evening  Follow Up Plan: Telephone follow up appointment with care management team member scheduled for: 6 months       Medication Assistance: None required.  Patient affirms current coverage meets needs.  Patient's preferred pharmacy is:  [facility], [address]. [place] [address] Phone: [phone] Fax: [phone]  Uses pill box? Yes Pt endorses 100% compliance  We discussed: Current pharmacy is preferred with insurance plan and patient is satisfied with pharmacy services Patient decided to: Continue current medication management strategy  Care Plan and Follow Up Patient Decision:  Patient agrees to Care Plan and Follow-up.  Plan: Telephone follow up appointment with care management team member scheduled for:  6 months  Jeni Salles, PharmD [place] Pharmacist [place] [facility] [place] [phone]

## 2020-07-01 DIAGNOSIS — Z961 Presence of intraocular lens: Secondary | ICD-10-CM | POA: Diagnosis not present

## 2020-07-01 DIAGNOSIS — H2511 Age-related nuclear cataract, right eye: Secondary | ICD-10-CM | POA: Diagnosis not present

## 2020-07-01 DIAGNOSIS — H18603 Keratoconus, unspecified, bilateral: Secondary | ICD-10-CM | POA: Diagnosis not present

## 2020-07-01 DIAGNOSIS — T868412 Corneal transplant failure, left eye: Secondary | ICD-10-CM | POA: Diagnosis not present

## 2020-07-01 DIAGNOSIS — Z947 Corneal transplant status: Secondary | ICD-10-CM | POA: Diagnosis not present

## 2020-07-08 NOTE — Patient Instructions (Addendum)
Hi Latoya Thompson,  It was great to get to meet you over the telephone! Below is a summary of some of the topics we discussed.   Please work on setting a timer to make sure to take the lovastatin in the evening and work on some diet changes we discussed to lower your cholesterol.   Please reach out to me if you have any questions or need anything before our follow up!  Best, Maddie  Jeni Salles, PharmD, [facility] [phone]  Visit Information  Goals Addressed   None    Patient Care Plan: CCM Pharmacy Care Plan    Problem Identified: Problem: Hypertension, Hyperlipidemia, GERD and Insomnia     Long-Range Goal: Patient-Specific Goal   Start Date: 06/29/2020  Expected End Date: 06/29/2021  This Visit's Progress: On track  Priority: High  Note:   Current Barriers:  . Unable to achieve control of cholesterol   Pharmacist Clinical Goal(s):  Marland Kitchen Patient will achieve control of cholesterol as evidenced by next lipid panel  through collaboration with PharmD and provider.   Interventions: . 1:1 collaboration with Martinique, Betty G, MD regarding development and update of comprehensive plan of care as evidenced by provider attestation and co-signature . Inter-disciplinary care team collaboration (see longitudinal plan of care) . Comprehensive medication review performed; medication list updated in electronic medical record  Hypertension (BP goal <130/80) -Controlled -Current treatment:  Enalapril 20 mg 1 tablet daily (in AM)   Verapamil 120 mg 1 tablet daily (in AM) -Medications previously tried: none  -Current home readings: 120-125/78-84 (daily or every other day) -Current dietary habits: doesn't eat a lot of salt, reading package labels; doesn't eat a lot of frozen foods (except vegetables) -Current exercise habits: reports exercising every morning at 5am, walking/cardio/weights/Zumba -Denies hypotensive/hypertensive  symptoms -Educated on Daily salt intake goal < 2300 mg; Exercise goal of 150 minutes per week; Importance of home blood pressure monitoring; Proper BP monitoring technique; -Counseled to monitor BP at home weekly, document, and provide log at future appointments -Counseled on diet and exercise extensively Recommended to continue current medication  Hyperlipidemia: (LDL goal < 100) -Uncontrolled -Current treatment:  Lovastatin 40 mg 1 tablet at bedtime -Medications previously tried: none  -Current dietary patterns: reports eating red meats: once or twice a week. She and her spouse eat more poultry than red meats (she reports spouse had a transplant and they are being careful on what they eat)  -Current exercise habits: reports exercising every morning at 5am, walking/ cardio/ weights/ Zumba  -Educated on Cholesterol goals;  Benefits of statin for ASCVD risk reduction; Importance of limiting foods high in cholesterol; Exercise goal of 150 minutes per week; -Counseled on diet and exercise extensively Recommended to continue current medication Counseled on importance of timing of lovastatin and recommended switching to an alternative statin that timing does not matter  Prevention of ASCVD (Goal: prevent heart attacks and strokes) -Controlled -Current treatment  . Aspirin EC 81 mg tablet daily -Medications previously tried: none  - Patient reports due to family history of DM, HTN, and stroke, her cardiologist put on this a couple of years back for prevention  GERD (Goal: minimize symptoms) -Controlled -Current treatment  . Pantoprazole 20 mg 1 tablet daily (twice weekly) -Medications previously tried: none  -Counseled on continuing to taper off the medication as patient did not notice a difference in symptoms when she went from three times a week to twice Recommended taking medication on days she is eating  food that will cause heartburn such as spaghetti  Insomnia (Goal: improve  quality and quantity of sleep) -Controlled -Current treatment   Zolpidem 5 mg, 0.5 to 1 tablet at bedtime as needed for sleep (rarely takes it - once every 2 weeks or 3 weeks) -Medications previously tried: none -Recommended to continue current medication Patient takes very sparingly sometimes cutting it into 1/4 tablets  Allergic rhinitis (Goal: minimize symptoms) -Controlled -Current treatment  . Montelukast 10 mg 1 tablet at bedtime as needed -Medications previously tried: none  -Recommended to continue current medication Patient is not sure this is helping and will trial a period without it to see if she notices a difference  Health Maintenance -Vaccine gaps: shingles -Current therapy:   Vitamin E 400 units 1 capsule daily (for NASH)  Vitamin C 500 mg 1 tablet daily  Sodium flouride 1.1% dental gel brush thoroughly at bedtime -Educated on Cost vs benefit of each product must be carefully weighed by individual consumer -Patient is satisfied with current therapy and denies issues -Recommended to continue current medication  Patient Goals/Self-Care Activities . Patient will:  - take medications as prescribed check blood pressure weekly, document, and provide at future appointments target a minimum of 150 minutes of moderate intensity exercise weekly Set a timer for lovastatin to be taken in the evening  Follow Up Plan: Telephone follow up appointment with care management team member scheduled for: 6 months       Patient verbalizes understanding of instructions provided today and agrees to view in [place].  Telephone follow up appointment with pharmacy team member scheduled for: 6 months  Viona Gilmore, [place] [facility]  High Cholesterol  High cholesterol is a condition in which the blood has high levels of a white, waxy substance similar to fat (cholesterol). The liver makes all the cholesterol that the body needs. The human body needs small amounts of cholesterol to help  build cells. A person gets extra or excess cholesterol from the food that he or she eats. The blood carries cholesterol from the liver to the rest of the body. If you have high cholesterol, deposits (plaques) may build up on the walls of your arteries. Arteries are the blood vessels that carry blood away from your heart. These plaques make the arteries narrow and stiff. Cholesterol plaques increase your risk for heart attack and stroke. Work with your health care provider to keep your cholesterol levels in a healthy range. What increases the risk? The following factors may make you more likely to develop this condition:  Eating foods that are high in animal fat (saturated fat) or cholesterol.  Being overweight.  Not getting enough exercise.  A family history of high cholesterol (familial hypercholesterolemia).  Use of tobacco products.  Having diabetes. What are the signs or symptoms? There are no symptoms of this condition. How is this diagnosed? This condition may be diagnosed based on the results of a blood test.  If you are older than 60 years of age, your health care provider may check your cholesterol levels every 4-6 years.  You may be checked more often if you have high cholesterol or other risk factors for heart disease. The blood test for cholesterol measures:  "Bad" cholesterol, or LDL cholesterol. This is the main type of cholesterol that causes heart disease. The desired level is less than 100 mg/dL.  "Good" cholesterol, or HDL cholesterol. HDL helps protect against heart disease by cleaning the arteries and carrying the LDL to the liver for processing.  The desired level for HDL is 60 mg/dL or higher.  Triglycerides. These are fats that your body can store or burn for energy. The desired level is less than 150 mg/dL.  Total cholesterol. This measures the total amount of cholesterol in your blood and includes LDL, HDL, and triglycerides. The desired level is less than 200  mg/dL. How is this treated? This condition may be treated with:  Diet changes. You may be asked to eat foods that have more fiber and less saturated fats or added sugar.  Lifestyle changes. These may include regular exercise, maintaining a healthy weight, and quitting use of tobacco products.  Medicines. These are given when diet and lifestyle changes have not worked. You may be prescribed a statin medicine to help lower your cholesterol levels. Follow these instructions at home: Eating and drinking  Eat a healthy, balanced diet. This diet includes: ? Daily servings of a variety of fresh, frozen, or canned fruits and vegetables. ? Daily servings of whole grain foods that are rich in fiber. ? Foods that are low in saturated fats and trans fats. These include poultry and fish without skin, lean cuts of meat, and low-fat dairy products. ? A variety of fish, especially oily fish that contain omega-3 fatty acids. Aim to eat fish at least 2 times a week.  Avoid foods and drinks that have added sugar.  Use healthy cooking methods, such as roasting, grilling, broiling, baking, poaching, steaming, and stir-frying. Do not fry your food except for stir-frying.   Lifestyle  Get regular exercise. Aim to exercise for a total of 150 minutes a week. Increase your activity level by doing activities such as gardening, walking, and taking the stairs.  Do not use any products that contain nicotine or tobacco, such as cigarettes, e-cigarettes, and chewing tobacco. If you need help quitting, ask your health care provider.   General instructions  Take over-the-counter and prescription medicines only as told by your health care provider.  Keep all follow-up visits as told by your health care provider. This is important. Where to find more information  American Heart Association: www.heart.org  National Heart, Lung, and Blood Institute: https://wilson-eaton.com/ Contact a health care provider if:  You have  trouble achieving or maintaining a healthy diet or weight.  You are starting an exercise program.  You are unable to stop smoking. Get help right away if:  You have chest pain.  You have trouble breathing.  You have any symptoms of a stroke. "BE FAST" is an easy way to remember the main warning signs of a stroke: ? B - Balance. Signs are dizziness, sudden trouble walking, or loss of balance. ? E - Eyes. Signs are trouble seeing or a sudden change in vision. ? F - Face. Signs are sudden weakness or numbness of the face, or the face or eyelid drooping on one side. ? A - Arms. Signs are weakness or numbness in an arm. This happens suddenly and usually on one side of the body. ? S - Speech. Signs are sudden trouble speaking, slurred speech, or trouble understanding what people say. ? T - Time. Time to call emergency services. Write down what time symptoms started.  You have other signs of a stroke, such as: ? A sudden, severe headache with no known cause. ? Nausea or vomiting. ? Seizure. These symptoms may represent a serious problem that is an emergency. Do not wait to see if the symptoms will go away. Get medical help right away. Call  your local emergency services (911 in the [place] Do not drive yourself to the hospital. Summary  Cholesterol plaques increase your risk for heart attack and stroke. Work with your health care provider to keep your cholesterol levels in a healthy range.  Eat a healthy, balanced diet, get regular exercise, and maintain a healthy weight.  Do not use any products that contain nicotine or tobacco, such as cigarettes, e-cigarettes, and chewing tobacco.  Get help right away if you have any symptoms of a stroke. This information is not intended to replace advice given to you by your health care provider. Make sure you discuss any questions you have with your health care provider. Document Revised: 01/27/2019 Document Reviewed: 01/27/2019 Elsevier Patient  Education  2021 [employer].

## 2020-07-11 NOTE — Progress Notes (Signed)
HPI: Latoya Thompson is a 60 y.o. female, who is here today for 6 months follow up.   She was last seen on 11/26/2019. Since her last visit she has seen her ophthalmologist to follow on keratoconus, s/p bilateral corneal transplant.  HTN: She is on Enalapril 20 mg daily and Verapamil 120 mg daily. LBBB and dilated cardiomyopathy: She follows with cardiologist annually.  Lab Results  Component Value Date   CREATININE 1.02 11/26/2019   BUN 17 11/26/2019   NA 142 11/26/2019   K 4.6 11/26/2019   CL 106 11/26/2019   CO2 29 11/26/2019   NASH: She has not noted abdominal pain, nausea, vomiting, stool/urine color changes, jaundice.  Lab Results  Component Value Date   ALT 13 11/26/2019   AST 19 11/26/2019   ALKPHOS 121 (H) 05/26/2019   BILITOT 0.7 11/26/2019   Tremor: Bilateral hand tremor. L>R. She is right-handed.  It is worse at the end of the day. Interfering with activities like typing on the computer and writing but it gets better as she continues activity. Tremor is not present when she is holding spoon or cup or at rest.  Father had some tremor. Paternal uncle had Parkinson's.  Lab Results  Component Value Date   TSH 0.76 01/04/2018   She would like to have FLP today. She is walking and wt lifting and sometimes Zumba. Currently she is on lovastatin 40 mg daily. She is following a healthful diet most of the time.  Component     Latest Ref Rng & Units 11/26/2019  Cholesterol     0 - 200 mg/dL 195  HDL Cholesterol     >39.00 mg/dL 54  Triglycerides     0.0 - 149.0 mg/dL 77  LDL Cholesterol (Calc)     mg/dL (calc) 124 (H)  Total CHOL/HDL Ratio      3.6  Non-HDL Cholesterol (Calc)     <130 mg/dL (calc) 141 (H)   She was evaluated by her gyn in 04/2020 she had a UA and was told she had protein in her urine. Today she has a few questions about EGFR differences in regard to race and meaning. No hx of CKD. He has not noted gross hematuria, decreased urine  output, or foamy urine.  She has urine leakage sometimes when she coughs (which happens occasionally) or sneezes.  Negative for dysuria, increased urinary frequency, or urgency.  Prediabetes: She has not noted polydipsia, polyuria, or polyphagia. 04/2016 HgA1C 5.9.  Review of Systems  Constitutional: Negative for activity change, appetite change, chills, fatigue and fever.  HENT: Negative for mouth sores, nosebleeds and sore throat.   Respiratory: Negative for cough and wheezing.   Gastrointestinal: Negative for blood in stool.       Negative for changes in bowel habits.  Neurological: Negative for syncope, facial asymmetry and weakness.  Hematological: Negative for adenopathy. Does not bruise/bleed easily.  Psychiatric/Behavioral: Negative for confusion.  Rest of ROS, see pertinent positives sand negatives in HPI  Current Outpatient Medications on File Prior to Visit  Medication Sig Dispense Refill  . aspirin EC 81 MG tablet Take 81 mg by mouth daily.    . enalapril (VASOTEC) 20 MG tablet Take 1 tablet by mouth once daily 90 tablet 2  . lovastatin (MEVACOR) 40 MG tablet TAKE 1 TABLET BY MOUTH AT BEDTIME 90 tablet 3  . montelukast (SINGULAIR) 10 MG tablet TAKE 1 TABLET BY MOUTH AT BEDTIME 90 tablet 1  . pantoprazole (PROTONIX) 20  MG tablet Take 1 tablet by mouth once daily 90 tablet 1  . sodium fluoride (FLUORISHIELD) 1.1 % GEL dental gel SF 5000 Plus 1.1 % dental cream  BRUSH THOROUGHLY ONCE DAILY AT BEDTIME    . verapamil (CALAN) 120 MG tablet Take 1 tablet by mouth once daily 90 tablet 2  . vitamin C (ASCORBIC ACID) 500 MG tablet Take 500 mg by mouth daily.    . vitamin E 400 UNIT capsule Take 400 Units by mouth daily.    Marland Kitchen zolpidem (AMBIEN) 5 MG tablet TAKE 1/2 TO 1 (ONE-HALF TO ONE) TABLET BY MOUTH AT BEDTIME AS NEEDED FOR SLEEP 15 tablet 1   No current facility-administered medications on file prior to visit.   Past Medical History:  Diagnosis Date  . Chicken pox   .  Hypercholesteremia   . Hypertension   . Keratoconus    Left   Allergies  Allergen Reactions  . Codeine Itching   Social History   Socioeconomic History  . Marital status: Married    Spouse name: Eddie Dibbles  . Number of children: 0  . Years of education: Not on file  . Highest education level: Bachelor's degree (e.g., BA, AB, BS)  Occupational History  . Occupation: Educator  Tobacco Use  . Smoking status: Never Smoker  . Smokeless tobacco: Never Used  Substance and Sexual Activity  . Alcohol use: No  . Drug use: No  . Sexual activity: Not on file  Other Topics Concern  . Not on file  Social History Narrative   Married, lives with husband Eddie Dibbles in a 2 story home. Drinks 1/2 cup of coffee a day, rare soda or tea. Exercise regularly for at least 60 minutes.   Social Determinants of Health   Financial Resource Strain: Low Risk   . Difficulty of Paying Living Expenses: Not hard at all  Food Insecurity: No Food Insecurity  . Worried About Charity fundraiser in the Last Year: Never true  . Ran Out of Food in the Last Year: Never true  Transportation Needs: No Transportation Needs  . Lack of Transportation (Medical): No  . Lack of Transportation (Non-Medical): No  Physical Activity: Sufficiently Active  . Days of Exercise per Week: 5 days  . Minutes of Exercise per Session: 60 min  Stress: No Stress Concern Present  . Feeling of Stress : Not at all  Social Connections: Moderately Integrated  . Frequency of Communication with Friends and Family: Twice a week  . Frequency of Social Gatherings with Friends and Family: Once a week  . Attends Religious Services: More than 4 times per year  . Active Member of Clubs or Organizations: No  . Attends Archivist Meetings: Never  . Marital Status: Married    Vitals:   07/12/20 0730  BP: 124/80  Pulse: 83  Temp: 98.1 F (36.7 C)  SpO2: 97%   Body mass index is 28.92 kg/m.   Physical Exam Vitals and nursing note  reviewed.  Constitutional:      General: She is not in acute distress.    Appearance: She is well-developed.  HENT:     Head: Normocephalic and atraumatic.     Mouth/Throat:     Mouth: Mucous membranes are moist.     Pharynx: Oropharynx is clear.  Eyes:     Conjunctiva/sclera:     Right eye: Right conjunctiva is not injected. No exudate or hemorrhage.    Pupils: Pupils are equal, round, and reactive to light.  Right eye: Pupil is round.     Comments: Scaring changes left conjunctivae/sclera.  Cardiovascular:     Rate and Rhythm: Normal rate and regular rhythm.     Pulses:          Dorsalis pedis pulses are 2+ on the right side and 2+ on the left side.     Heart sounds: No murmur heard.   Pulmonary:     Effort: Pulmonary effort is normal. No respiratory distress.     Breath sounds: Normal breath sounds.  Abdominal:     Palpations: Abdomen is soft. There is no hepatomegaly or mass.     Tenderness: There is no abdominal tenderness.  Lymphadenopathy:     Cervical: No cervical adenopathy.  Skin:    General: Skin is warm.     Findings: No erythema or rash.  Neurological:     Mental Status: She is alert and oriented to person, place, and time.     Cranial Nerves: No cranial nerve deficit.     Gait: Gait normal.  Psychiatric:     Comments: Well groomed, good eye contact.   ASSESSMENT AND PLAN:  Latoya Thompson was seen today for 6 months follow-up.  Orders Placed This Encounter  Procedures  . Hemoglobin A1c  . Basic metabolic panel  . Hepatic function panel  . Lipid panel  . Microalbumin / creatinine urine ratio  . TSH   Lab Results  Component Value Date   HGBA1C 5.9 07/12/2020   Lab Results  Component Value Date   TSH 1.64 07/12/2020   Lab Results  Component Value Date   CHOL 196 07/12/2020   HDL 50.90 07/12/2020   LDLCALC 127 (H) 07/12/2020   TRIG 95.0 07/12/2020   CHOLHDL 4 07/12/2020   Lab Results  Component Value Date   ALT 17 07/12/2020    AST 21 07/12/2020   ALKPHOS 121 (H) 07/12/2020   BILITOT 0.6 07/12/2020   Lab Results  Component Value Date   MICROALBUR 0.9 07/12/2020   Proteinuria, unspecified type Further recommendation will be given according to microalbumin/creatinine ratio. We discussed general interpretation of EGFR.  Hypertension BP adequately controlled. Continue enalapril 20 mg daily and verapamil 120 mg daily. Low-salt diet. Monitor BP at home.  NASH (nonalcoholic steatohepatitis) Problem has been stable with mild LFTs abnormalities. Continue vitamin D supplementation. Further recommendation will be given according to LFT results.  Hypercholesteremia Continue lovastatin 40 mg daily. Further recommendation will be given according to lipid panel result.  Tremor, unspecified Problem has been going on for a couple years now and it seems to be stable. We discussed possible etiologies. History and examination today do not suggest a serious process. We discussed treatment options. Primidone side effects discussed, she prefers to hold on pharmacologic treatment for now. Continue monitoring for changes/new symptoms.   Stress incontinence in female We discussed treatment options. Kegel and pelvic floor exercises recommended for now.  Prediabetes Encouraged to continue a healthy lifestyle for diabetes prevention. Further recommendation will be given according to A1c result.  Spent 40 minutes.  During this time history was obtained and documented, examination was performed, prior labs reviewed, and assessment/plan discussed.  Return in about 6 months (around 01/12/2021) for cpe.  Keri Tavella G. Martinique, MD  [facility]. [place] office.   A few things to remember from today's visit:   Primary hypertension - Plan: Basic metabolic panel  NASH (nonalcoholic steatohepatitis) - Plan: Hepatic function panel  Tremor, unspecified - Plan: TSH  Insomnia, unspecified type  Stress incontinence  in female  Prediabetes - Plan: Hemoglobin A1c  Proteinuria, unspecified type - Plan: Microalbumin / creatinine urine ratio  Hypercholesteremia - Plan: Lipid panel  If you need refills please call your pharmacy. Do not use My Chart to request refills or for acute issues that need immediate attention.   No changes today. Primidone is the medication we can try for tremors.  This exercise helps with mild urine leakage associated with cough, laughing, or sneezing. It may help with other types of urine incontinence.   Tighten and relax the pelvic muscles intermittently during the day. Once you are familiar with exercise try to hold pelvic muscles contraction for about 8-10 seconds.in the beginning you may not be able to hold contraction for more than a second or 2 but eventually you will be able to hold contraction harder and for longer time. Perform  8-12 exercises 3 times per day and daily for 15-20 weeks. You will need to continue exercises indefinitely to have a lasting effect.  Please be sure medication list is accurate. If a new problem present, please set up appointment sooner than planned today.

## 2020-07-12 ENCOUNTER — Encounter: Payer: Self-pay | Admitting: Family Medicine

## 2020-07-12 ENCOUNTER — Ambulatory Visit (INDEPENDENT_AMBULATORY_CARE_PROVIDER_SITE_OTHER): Payer: PPO | Admitting: Family Medicine

## 2020-07-12 ENCOUNTER — Other Ambulatory Visit: Payer: Self-pay

## 2020-07-12 VITALS — BP 124/80 | HR 83 | Temp 98.1°F | Ht 63.0 in | Wt 163.2 lb

## 2020-07-12 DIAGNOSIS — I10 Essential (primary) hypertension: Secondary | ICD-10-CM | POA: Diagnosis not present

## 2020-07-12 DIAGNOSIS — R7303 Prediabetes: Secondary | ICD-10-CM

## 2020-07-12 DIAGNOSIS — G4700 Insomnia, unspecified: Secondary | ICD-10-CM

## 2020-07-12 DIAGNOSIS — R809 Proteinuria, unspecified: Secondary | ICD-10-CM

## 2020-07-12 DIAGNOSIS — K7581 Nonalcoholic steatohepatitis (NASH): Secondary | ICD-10-CM

## 2020-07-12 DIAGNOSIS — R251 Tremor, unspecified: Secondary | ICD-10-CM

## 2020-07-12 DIAGNOSIS — N393 Stress incontinence (female) (male): Secondary | ICD-10-CM | POA: Diagnosis not present

## 2020-07-12 DIAGNOSIS — E7800 Pure hypercholesterolemia, unspecified: Secondary | ICD-10-CM

## 2020-07-12 LAB — MICROALBUMIN / CREATININE URINE RATIO
Creatinine,U: 137 mg/dL
Microalb Creat Ratio: 0.7 mg/g (ref 0.0–30.0)
Microalb, Ur: 0.9 mg/dL (ref 0.0–1.9)

## 2020-07-12 LAB — BASIC METABOLIC PANEL
BUN: 16 mg/dL (ref 6–23)
CO2: 28 mEq/L (ref 19–32)
Calcium: 9.8 mg/dL (ref 8.4–10.5)
Chloride: 107 mEq/L (ref 96–112)
Creatinine, Ser: 1.08 mg/dL (ref 0.40–1.20)
GFR: 55.99 mL/min — ABNORMAL LOW (ref >60.00)
Glucose, Bld: 88 mg/dL (ref 70–99)
Potassium: 4.1 mEq/L (ref 3.5–5.1)
Sodium: 144 mEq/L (ref 135–145)

## 2020-07-12 LAB — HEPATIC FUNCTION PANEL
ALT: 17 U/L (ref 0–35)
AST: 21 U/L (ref 0–37)
Albumin: 4.4 g/dL (ref 3.5–5.2)
Alkaline Phosphatase: 121 U/L — ABNORMAL HIGH (ref 39–117)
Bilirubin, Direct: 0.1 mg/dL (ref 0.0–0.3)
Total Bilirubin: 0.6 mg/dL (ref 0.2–1.2)
Total Protein: 7.4 g/dL (ref 6.0–8.3)

## 2020-07-12 LAB — LIPID PANEL
Cholesterol: 196 mg/dL (ref 0–200)
HDL: 50.9 mg/dL (ref >39.00)
LDL Cholesterol: 127 mg/dL — ABNORMAL HIGH (ref 0–99)
NonHDL: 145.59
Total CHOL/HDL Ratio: 4
Triglycerides: 95 mg/dL (ref 0.0–149.0)
VLDL: 19 mg/dL (ref 0.0–40.0)

## 2020-07-12 LAB — TSH: TSH: 1.64 u[IU]/mL (ref 0.35–4.50)

## 2020-07-12 LAB — HEMOGLOBIN A1C: Hgb A1c MFr Bld: 5.9 % (ref 4.6–6.5)

## 2020-07-12 NOTE — Assessment & Plan Note (Signed)
Continue lovastatin 40 mg daily. Further recommendation will be given according to lipid panel result.

## 2020-07-12 NOTE — Assessment & Plan Note (Signed)
Problem has been stable with mild LFTs abnormalities. Continue vitamin D supplementation. Further recommendation will be given according to LFT results.

## 2020-07-12 NOTE — Assessment & Plan Note (Signed)
Encouraged to continue a healthy lifestyle for diabetes prevention. Further recommendation will be given according to A1c result.

## 2020-07-12 NOTE — Assessment & Plan Note (Signed)
Problem has been going on for a couple years now and it seems to be stable. We discussed possible etiologies. History and examination today do not suggest a serious process. We discussed treatment options. Primidone side effects discussed, she prefers to hold on pharmacologic treatment for now. Continue monitoring for changes/new symptoms.

## 2020-07-12 NOTE — Assessment & Plan Note (Signed)
BP adequately controlled. Continue enalapril 20 mg daily and verapamil 120 mg daily. Low-salt diet. Monitor BP at home.

## 2020-07-12 NOTE — Patient Instructions (Signed)
A few things to remember from today's visit:   Primary hypertension - Plan: Basic metabolic panel  NASH (nonalcoholic steatohepatitis) - Plan: Hepatic function panel  Tremor, unspecified - Plan: TSH  Insomnia, unspecified type  Stress incontinence in female  Prediabetes - Plan: Hemoglobin A1c  Proteinuria, unspecified type - Plan: Microalbumin / creatinine urine ratio  Hypercholesteremia - Plan: Lipid panel  If you need refills please call your pharmacy. Do not use My Chart to request refills or for acute issues that need immediate attention.   No changes today. Primidone is the medication we can try for tremors.  This exercise helps with mild urine leakage associated with cough, laughing, or sneezing. It may help with other types of urine incontinence.   Tighten and relax the pelvic muscles intermittently during the day. Once you are familiar with exercise try to hold pelvic muscles contraction for about 8-10 seconds.in the beginning you may not be able to hold contraction for more than a second or 2 but eventually you will be able to hold contraction harder and for longer time. Perform  8-12 exercises 3 times per day and daily for 15-20 weeks. You will need to continue exercises indefinitely to have a lasting effect.      Please be sure medication list is accurate. If a new problem present, please set up appointment sooner than planned today.

## 2020-07-12 NOTE — Assessment & Plan Note (Signed)
We discussed treatment options. Kegel and pelvic floor exercises recommended for now.

## 2020-11-14 ENCOUNTER — Other Ambulatory Visit: Payer: Self-pay | Admitting: Family Medicine

## 2020-12-15 ENCOUNTER — Ambulatory Visit: Payer: PPO

## 2020-12-16 DIAGNOSIS — Z124 Encounter for screening for malignant neoplasm of cervix: Secondary | ICD-10-CM | POA: Diagnosis not present

## 2020-12-16 DIAGNOSIS — Z1231 Encounter for screening mammogram for malignant neoplasm of breast: Secondary | ICD-10-CM | POA: Diagnosis not present

## 2020-12-16 DIAGNOSIS — Z01419 Encounter for gynecological examination (general) (routine) without abnormal findings: Secondary | ICD-10-CM | POA: Diagnosis not present

## 2020-12-21 ENCOUNTER — Other Ambulatory Visit: Payer: Self-pay | Admitting: Obstetrics & Gynecology

## 2020-12-21 DIAGNOSIS — R928 Other abnormal and inconclusive findings on diagnostic imaging of breast: Secondary | ICD-10-CM

## 2020-12-27 ENCOUNTER — Telehealth: Payer: Self-pay | Admitting: Pharmacist

## 2020-12-27 NOTE — Chronic Care Management (AMB) (Signed)
    Chronic Care Management Pharmacy Assistant   Name: Latoya Thompson  MRN: [id number] DOB: [birth year]  12/28/20 APPOINTMENT REMINDER   [name] was reminded to have all medications, supplements and any blood glucose and blood pressure readings available for review with Jeni Salles, Pharm. D, at her telephone visit on 12/28/20 at 8am.   Questions: Have you had any recent office visit or specialist visit outside of [place]? No.  Are there any concerns you would like to discuss during your office visit? No.  Are you having any problems obtaining your medications? No.  If patient has any PAP medications ask if they are having any problems getting their PAP medication or refill? No patient assistance.  Care Gaps:  AWV - scheduled for 01/12/21 Zoster vaccines - never done Covid-19 vaccine booster 3 - overdue since 07/14/19 Flu vaccine - due   Star Rating Drug:  Enalapril 83m - last filled on 12/05/20 90DS at [facility] Lovastatin 48m- last filled on 10/19/20 90DS at [facility] Ver[facility]amil 12047m [facility]? No.  [facility] Pharmacist Assistant [phone]

## 2020-12-28 ENCOUNTER — Ambulatory Visit (INDEPENDENT_AMBULATORY_CARE_PROVIDER_SITE_OTHER): Payer: PPO | Admitting: Pharmacist

## 2020-12-28 DIAGNOSIS — E7800 Pure hypercholesterolemia, unspecified: Secondary | ICD-10-CM

## 2020-12-28 DIAGNOSIS — I10 Essential (primary) hypertension: Secondary | ICD-10-CM

## 2020-12-28 NOTE — Progress Notes (Signed)
Chronic Care Management Pharmacy Note  12/28/2020 Name:  Latoya Thompson MRN:  [id number] DOB:  April 22, 1960  Summary: LDL not at goal < 100 BP at goal < 130/80 per home and office readings   Recommendations/Changes made from today's visit: -Recommended trial period without montelukast as patient does not think it helps -Recommended repeat lipid panel and switching to high intensity statin if LDL > 100   Plan: Follow up in 6 months  Subjective: Latoya Thompson is an 60 y.o. year old female who is a primary patient of Martinique, Malka So, MD.  The CCM team was consulted for assistance with disease management and care coordination needs.    Engaged with patient by telephone for follow up visit in response to provider referral for pharmacy case management and/or care coordination services.   Consent to Services:  The patient was given information about Chronic Care Management services, agreed to services, and gave verbal consent prior to initiation of services.  Please see initial visit note for detailed documentation.   Patient Care Team: Martinique, Betty G, MD as PCP - General (Family Medicine) Viona Gilmore, [facility] as Pharmacist (Pharmacist)  Recent office visits: 07/12/20 Betty Martinique, MD: Patient presented for chronic conditions follow up. LDL increased.   12/10/19 Charlott Rakes, LPN: Patient presented for AWV.  Recent consult visits: 12/16/20 Vania Rea (OBGYN): Unable to access notes.  07/01/20 Lance Sell, OD (ophthalomology): Patient presented for keratoconus of both eyes and annual eye exam.  05/10/20 Jenkins Rouge, MD (cardiology): Patient presented for cardiomyopathy follow up. No medication changes made.  Hospital visits: None in previous 6 months  Objective:  Lab Results  Component Value Date   CREATININE 1.08 07/12/2020   BUN 16 07/12/2020   GFR 55.99 (L) 07/12/2020   GFRNONAA 60 11/26/2019   GFRAA 70 11/26/2019   NA 144 07/12/2020   K 4.1 07/12/2020    CALCIUM 9.8 07/12/2020   CO2 28 07/12/2020   GLUCOSE 88 07/12/2020    Lab Results  Component Value Date/Time   HGBA1C 5.9 07/12/2020 08:40 AM   GFR 55.99 (L) 07/12/2020 08:40 AM   GFR 64.91 05/26/2019 08:01 AM   MICROALBUR 0.9 07/12/2020 08:40 AM    Last diabetic Eye exam: No results found for: HMDIABEYEEXA  Last diabetic Foot exam: No results found for: HMDIABFOOTEX   Lab Results  Component Value Date   CHOL 196 07/12/2020   HDL 50.90 07/12/2020   LDLCALC 127 (H) 07/12/2020   TRIG 95.0 07/12/2020   CHOLHDL 4 07/12/2020    Hepatic Function Latest Ref Rng & Units 07/12/2020 11/26/2019 05/26/2019  Total Protein 6.0 - 8.3 g/dL 7.4 7.4 7.3  Albumin 3.5 - 5.2 g/dL 4.4 - 4.3  AST 0 - 37 U/L 21 19 22   ALT 0 - 35 U/L 17 13 15   Alk Phosphatase 39 - 117 U/L 121(H) - 121(H)  Total Bilirubin 0.2 - 1.2 mg/dL 0.6 0.7 0.5  Bilirubin, Direct 0.0 - 0.3 mg/dL 0.1 - -    Lab Results  Component Value Date/Time   TSH 1.64 07/12/2020 08:40 AM   TSH 0.76 01/04/2018 07:40 AM    CBC Latest Ref Rng & Units 07/13/2016 09/02/2015  WBC 4.0 - 10.5 K/uL 7.1 5.9  Hemoglobin 12.0 - 15.0 g/dL 15.5(H) 14.4  Hematocrit 36.0 - 46.0 % 45.8 42.2  Platelets 150 - 400 K/uL 195 168    No results found for: VD25OH  Clinical ASCVD: No  The 10-year ASCVD risk score (Arnett DK,  et al., 2019) is: 5.5%   Values used to calculate the score:     Age: 28 years     Sex: Female     Is Non-Hispanic African American: Yes     Diabetic: No     Tobacco smoker: No     Systolic Blood Pressure: 735 mmHg     Is BP treated: Yes     HDL Cholesterol: 50.9 mg/dL     Total Cholesterol: 196 mg/dL    Depression screen [facility] 2/9 12/10/2019 11/29/2019 11/28/2018  Decreased Interest 0 0 0  Down, Depressed, Hopeless 0 0 0  PHQ - 2 Score 0 0 0      Social History   Tobacco Use  Smoking Status Never  Smokeless Tobacco Never   BP Readings from Last 3 Encounters:  07/12/20 124/80  05/10/20 120/90  11/26/19 124/74   Pulse  Readings from Last 3 Encounters:  07/12/20 83  05/10/20 68  11/26/19 93   Wt Readings from Last 3 Encounters:  07/12/20 163 lb 4 oz (74 kg)  05/10/20 162 lb 3.2 oz (73.6 kg)  11/26/19 158 lb 6 oz (71.8 kg)   BMI Readings from Last 3 Encounters:  07/12/20 28.92 kg/m  05/10/20 28.73 kg/m  11/26/19 28.05 kg/m    Assessment/Interventions: Review of patient past medical history, allergies, medications, health status, including review of consultants reports, laboratory and other test data, was performed as part of comprehensive evaluation and provision of chronic care management services.   SDOH:  (Social Determinants of Health) assessments and interventions performed: No  SDOH Screenings   Alcohol Screen: Not on file  Depression (PHQ2-9): Not on file  Financial Resource Strain: Not on file  Food Insecurity: Not on file  Housing: Not on file  Physical Activity: Not on file  Social Connections: Not on file  Stress: Not on file  Tobacco Use: Low Risk    Smoking Tobacco Use: Never   Smokeless Tobacco Use: Never  Transportation Needs: Not on file    [place]  Allergies  Allergen Reactions   Codeine Itching    Medications Reviewed Today     Reviewed by Viona Gilmore, [facility] (Pharmacist) on 12/28/20 at 0824  Med List Status: <None>   Medication Order Taking? Sig Documenting Provider Last Dose Status Informant  aspirin EC 81 MG tablet [id number] Yes Take 81 mg by mouth daily. [provider] Taking Active   enalapril (VASOTEC) 20 MG tablet [id number] Yes Take 1 tablet by mouth once daily Martinique, Betty G, MD Taking Active   lovastatin (MEVACOR) 40 MG tablet [id number] Yes TAKE 1 TABLET BY MOUTH AT BEDTIME Martinique, Betty G, MD Taking Active   montelukast (SINGULAIR) 10 MG tablet [id number] Yes TAKE 1 TABLET BY MOUTH AT BEDTIME Martinique, Betty G, MD Taking Active   pantoprazole (PROTONIX) 20 MG tablet [id number]  Take 1 tablet by mouth once daily Martinique, Betty G, MD   Active   sodium fluoride (FLUORISHIELD) 1.1 % GEL dental gel [id number]  SF 5000 Plus 1.1 % dental cream  BRUSH THOROUGHLY ONCE DAILY AT BEDTIME [provider]  Active   verapamil (CALAN) 120 MG tablet [id number]  Take 1 tablet by mouth once daily Martinique, Betty G, MD  Active   vitamin C (ASCORBIC ACID) 500 MG tablet [id number]  Take 500 mg by mouth daily. [provider]  Active Self  vitamin E 400 UNIT capsule [id number]  Take 400 Units by mouth daily. 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   LBBB (left bundle branch block) 07/04/2013   Chest pain 07/04/2013   Hypertension    Hypercholesteremia     Immunization History  Administered Date(s) Administered   Hep A / Hep B 11/29/2016   Influenza, Seasonal, Injecte, Preservative Fre 12/23/2014   Influenza,inj,Quad PF,6+ Mos 11/29/2016, 01/02/2018, 11/26/2018, 11/26/2019   Influenza-Unspecified 12/23/2014   PFIZER(Purple Top)SARS-COV-2 Vaccination 05/22/2019, 06/16/2019   PPD Test 11/29/2016   Pfizer Covid-19 Vaccine Bivalent Booster 81yr & up 12/17/2020   Tdap 02/09/2012    Conditions to be addressed/monitored:  Hypertension, Hyperlipidemia, GERD, Allergic Rhinitis, and Insomnia  Conditions addressed this visit: Hypertension, hyperlipidemia, GERD  Care Plan : CFrost Updates made by PViona Gilmore  [place]landsince 12/28/2020 12:00 AM     Problem: Problem: Hypertension, Hyperlipidemia, GERD and Insomnia      Long-Range Goal: Patient-Specific Goal   Start Date: 06/29/2020  Expected End Date: 06/29/2021  Recent Progress: On track  Priority: High  Note:   Current Barriers:  Unable to achieve control of cholesterol   Pharmacist Clinical Goal(s):  Patient will achieve control of cholesterol as evidenced by next lipid panel  through collaboration with PharmD and provider.   Interventions: 1:1 collaboration with JMartinique Betty G, MD regarding development and update of comprehensive plan of care as evidenced by provider attestation and co-signature Inter-disciplinary care team collaboration (see longitudinal plan of care) Comprehensive medication review performed; medication list updated in electronic medical record  Hypertension (BP goal <130/80) -Controlled -Current treatment: Enalapril 20 mg 1 tablet daily (in AM)  Verapamil 120 mg 1 tablet daily (in AM) -Medications previously tried: none  -Current home readings: 120/78 (daily or every other day) -Current dietary habits: doesn't eat a lot of salt, reading package labels; doesn't eat a lot of frozen foods (except vegetables) -Current exercise habits: reports exercising every morning at 5am, walking/cardio/weights/Zumba; been going back to the [employer] times a week (treadmill, lifting weights) 2-3 months ago -Denies hypotensive/hypertensive symptoms -Educated on Daily salt intake goal < 2300 mg; Exercise goal of 150 minutes per week; Importance of home blood pressure monitoring; Proper BP monitoring technique; -Counseled to monitor BP at home weekly, document, and provide log at future appointments -Counseled on diet and exercise extensively Recommended to continue current medication  Hyperlipidemia: (LDL goal < 100) -Uncontrolled -Current treatment: Lovastatin 40 mg 1 tablet at bedtime -Medications previously tried: none   -Current dietary patterns: reports eating red meats: once or twice a week. She and her spouse eat more poultry than red meats (she reports spouse had a transplant and they are being careful on what they eat); eats out once or twice a month; uses canola or olive oil -Current exercise habits: reports exercising every morning at 5am, walking/ cardio/ weights/ Zumba  -Educated on Cholesterol goals;  Benefits of statin for ASCVD risk reduction; Importance  of limiting foods high in cholesterol; Exercise goal of 150 minutes per week; -Counseled on diet and exercise extensively Recommended to continue current medication Patient uses Alexa to help with reminders to take this in the evening.  Prevention of ASCVD (Goal: prevent heart attacks and strokes) -Controlled -Current treatment  Aspirin EC 81 mg tablet daily -Medications previously tried: none  - Patient reports due to family history of DM, HTN, and stroke, her cardiologist put on this a couple of years back for prevention  GERD (Goal: minimize symptoms) -Controlled -Current treatment  Pantoprazole 20 mg 1 tablet daily (taking as needed) -Medications previously tried: none  -Recommended taking medication on days she is eating food that will cause heartburn such as spaghetti and tomato sauce.  Insomnia (Goal: improve quality and quantity of sleep) -Controlled -Current treatment  Zolpidem 5 mg, 0.5 to 1 tablet at bedtime as needed for sleep (rarely takes it - once every 2 weeks or 3 weeks) -Medications previously tried: none -Recommended to continue current medication Patient takes very sparingly sometimes cutting it into 1/4 tablets  Allergic rhinitis (Goal: minimize symptoms) -Controlled -Current treatment  Montelukast 10 mg 1 tablet at bedtime as needed -Medications previously tried: none  -Recommended to continue current medication Patient is not sure this is helping and will trial a period without it to see if she notices a  difference.  Health Maintenance -Vaccine gaps: shingles -Current therapy:  Vitamin E 400 units 1 capsule daily (for NASH) Vitamin C 500 mg 1 tablet daily Sodium flouride 1.1% dental gel brush thoroughly at bedtime -Educated on Cost vs benefit of each product must be carefully weighed by individual consumer -Patient is satisfied with current therapy and denies issues -Recommended to continue current medication  Patient Goals/Self-Care Activities Patient will:  - take medications as prescribed check blood pressure weekly, document, and provide at future appointments target a minimum of 150 minutes of moderate intensity exercise weekly Set a timer for lovastatin to be taken in the evening.  Follow Up Plan: Telephone follow up appointment with care management team member scheduled for: 6 months        Medication Assistance: None required.  Patient affirms current coverage meets needs.  Compliance/Adherence/Medication fill history: Care Gaps: Influenza, shingrix   Star-Rating Drugs: Enalapril 66m - last filled on 12/05/20 90DS at [facility] Lovastatin 491m- last filled on 10/19/20 [facility] at [facility] Verapamil 12030m [facility] [id number]: [phone]: [phone] pill box? Yes Pt endorses 100% compliance  We discussed: Current pharmacy is preferred with insurance plan and patient is satisfied with pharmacy services Patient decided to: Continue current medication management strategy  Care Plan and Follow Up Patient Decision:  Patient agrees to Care Plan and Follow-up.  Plan: Telephone follow up appointment with care management team member scheduled for:  6 months  MadJeni SallesharmD BCADantearmacist [place] [phone]

## 2020-12-28 NOTE — Patient Instructions (Signed)
Hi Promyse,  It was great to catch up with you again!   Please reach out to me if you have any questions or need anything before our follow up!  Best, Maddie  Jeni Salles, PharmD, [facility] [phone]   Visit Information   Goals Addressed   None    Patient Care Plan: CCM Pharmacy Care Plan     Problem Identified: Problem: Hypertension, Hyperlipidemia, GERD and Insomnia      Long-Range Goal: Patient-Specific Goal   Start Date: 06/29/2020  Expected End Date: 06/29/2021  Recent Progress: On track  Priority: High  Note:   Current Barriers:  Unable to achieve control of cholesterol   Pharmacist Clinical Goal(s):  Patient will achieve control of cholesterol as evidenced by next lipid panel  through collaboration with PharmD and provider.   Interventions: 1:1 collaboration with Martinique, Betty G, MD regarding development and update of comprehensive plan of care as evidenced by provider attestation and co-signature Inter-disciplinary care team collaboration (see longitudinal plan of care) Comprehensive medication review performed; medication list updated in electronic medical record  Hypertension (BP goal <130/80) -Controlled -Current treatment: Enalapril 20 mg 1 tablet daily (in AM)  Verapamil 120 mg 1 tablet daily (in AM) -Medications previously tried: none  -Current home readings: 120/78 (daily or every other day) -Current dietary habits: doesn't eat a lot of salt, reading package labels; doesn't eat a lot of frozen foods (except vegetables) -Current exercise habits: reports exercising every morning at 5am, walking/cardio/weights/Zumba; been going back to the [employer] 4 times a week (treadmill, lifting weights) 2-3 months ago -Denies hypotensive/hypertensive symptoms -Educated on Daily salt intake goal < 2300 mg; Exercise goal of 150 minutes per week; Importance of home blood pressure monitoring; Proper BP monitoring  technique; -Counseled to monitor BP at home weekly, document, and provide log at future appointments -Counseled on diet and exercise extensively Recommended to continue current medication  Hyperlipidemia: (LDL goal < 100) -Uncontrolled -Current treatment: Lovastatin 40 mg 1 tablet at bedtime -Medications previously tried: none  -Current dietary patterns: reports eating red meats: once or twice a week. She and her spouse eat more poultry than red meats (she reports spouse had a transplant and they are being careful on what they eat); eats out once or twice a month; uses canola or olive oil -Current exercise habits: reports exercising every morning at 5am, walking/ cardio/ weights/ Zumba  -Educated on Cholesterol goals;  Benefits of statin for ASCVD risk reduction; Importance of limiting foods high in cholesterol; Exercise goal of 150 minutes per week; -Counseled on diet and exercise extensively Recommended to continue current medication Patient uses Alexa to help with reminders to take this in the evening.  Prevention of ASCVD (Goal: prevent heart attacks and strokes) -Controlled -Current treatment  Aspirin EC 81 mg tablet daily -Medications previously tried: none  - Patient reports due to family history of DM, HTN, and stroke, her cardiologist put on this a couple of years back for prevention  GERD (Goal: minimize symptoms) -Controlled -Current treatment  Pantoprazole 20 mg 1 tablet daily (taking as needed) -Medications previously tried: none  -Recommended taking medication on days she is eating food that will cause heartburn such as spaghetti and tomato sauce.  Insomnia (Goal: improve quality and quantity of sleep) -Controlled -Current treatment  Zolpidem 5 mg, 0.5 to 1 tablet at bedtime as needed for sleep (rarely takes it - once every 2 weeks or 3 weeks) -Medications previously tried: none -Recommended to  continue current medication Patient takes very sparingly sometimes  cutting it into 1/4 tablets  Allergic rhinitis (Goal: minimize symptoms) -Controlled -Current treatment  Montelukast 10 mg 1 tablet at bedtime as needed -Medications previously tried: none  -Recommended to continue current medication Patient is not sure this is helping and will trial a period without it to see if she notices a difference.  Health Maintenance -Vaccine gaps: shingles -Current therapy:  Vitamin E 400 units 1 capsule daily (for NASH) Vitamin C 500 mg 1 tablet daily Sodium flouride 1.1% dental gel brush thoroughly at bedtime -Educated on Cost vs benefit of each product must be carefully weighed by individual consumer -Patient is satisfied with current therapy and denies issues -Recommended to continue current medication  Patient Goals/Self-Care Activities Patient will:  - take medications as prescribed check blood pressure weekly, document, and provide at future appointments target a minimum of 150 minutes of moderate intensity exercise weekly Set a timer for lovastatin to be taken in the evening.  Follow Up Plan: Telephone follow up appointment with care management team member scheduled for: 6 months       Patient verbalizes understanding of instructions provided today and agrees to view in [place].  Telephone follow up appointment with pharmacy team member scheduled for: 6 months  Viona Gilmore, [facility]

## 2020-12-29 ENCOUNTER — Telehealth: Payer: Self-pay | Admitting: Family Medicine

## 2020-12-29 NOTE — Telephone Encounter (Signed)
Left message for patient to call back and schedule Medicare Annual Wellness Visit (AWV) either virtually or in office. Left  my Herbie Drape number [phone]   Last AWV 12/10/19  please schedule at anytime with [facility] Lead 1 or 2   This should be a 45 minute visit.

## 2021-01-10 DIAGNOSIS — E7800 Pure hypercholesterolemia, unspecified: Secondary | ICD-10-CM

## 2021-01-10 DIAGNOSIS — I10 Essential (primary) hypertension: Secondary | ICD-10-CM

## 2021-01-11 NOTE — Progress Notes (Signed)
HPI: [name] is a 60 y.o. female, who is here today for her routine physical.  Last CPE: 11/26/19.  Regular exercise 3 or more time per week: She is going to the gyn 4 times per week. Following a healthy diet: She tried to eat with moderation. She lives with her husband.  Chronic medical problems: HLD,tremor,NASH,,left eye blindness, cardiomyopathy/LBBB, insomnia,and GERD among some.  Pap smear: 12/16/20 She saw her gyn in 12/2020.  Immunization History  Administered Date(s) Administered   Hep A / Hep B 11/29/2016   Influenza, Seasonal, Injecte, Preservative Fre 12/23/2014   Influenza,inj,Quad PF,6+ Mos 11/29/2016, 01/02/2018, 11/26/2018, 11/26/2019, 01/12/2021   Influenza-Unspecified 12/23/2014   PFIZER(Purple Top)SARS-COV-2 Vaccination 05/22/2019, 06/16/2019   PPD Test 11/29/2016   Pfizer Covid-19 Vaccine Bivalent Booster 4yr & up 12/17/2020   Tdap 02/09/2012   Mammogram: 12/16/20. Colonoscopy: 10/05/15, 10 years f/u was recommended. DEXA: N/A   Hep C screening: 08/17/14 NR  Hyperlipidemia: Currently she is on lovastatin 40 mg daily and low-fat diet. Tolerating medication well.  Lab Results  Component Value Date   CHOL 196 07/12/2020   HDL 50.90 07/12/2020   LDLCALC 127 (H) 07/12/2020   TRIG 95.0 07/12/2020   CHOLHDL 4 07/12/2020   Hypertension: Currently she is on verapamil 120 mg daily and enalapril 20 mg daily. Negative for CP, dyspnea, orthopnea, PND, or unusual/frequent headache.  States that for the past 1-2 weeks she feels "tired" and has some back aching, "not severe,"mainly when going up stairs but also at rest and with other chores around the house. No symptoms with exercise. No associated cough or wheezing.  Also when she eats certain foods she feels like it irritates upper mid chest, no dysphagia.States that she can feel food going down.  He has noted when she eats spaghetti with sauce. + Heartburn. She has not taken Singulair and Protonix  for a few weeks, it was recommended to stop it by pharmacist.  Prediabetes: Negative for polydipsia, polyuria, polyphagia. She wonders if urinary urgency is a sign of diabetes, probably has been going on for a few years and is stable. Negative for gross hematuria, dysuria, foamy urine, or decreased urine output. Hx of stress incontinence, symptoms are stable.  Lab Results  Component Value Date   HGBA1C 5.9 07/12/2020   [place]: She follows with GI regularly.  Lab Results  Component Value Date   ALT 17 07/12/2020   AST 21 07/12/2020   ALKPHOS 121 (H) 07/12/2020   BILITOT 0.6 07/12/2020   Also asking what she needs to do for her insurance to cover breast reduction, she thinks her breast is "too big."  Review of Systems  Constitutional:  Negative for appetite change and fever.  HENT:  Negative for hearing loss, mouth sores and sore throat.   Eyes:  Negative for pain and redness.  Respiratory:  Negative for cough, shortness of breath and wheezing.   Cardiovascular:  Negative for chest pain and leg swelling.  Gastrointestinal:  Negative for abdominal pain, nausea and vomiting.       No changes in bowel habits.  Endocrine: Negative for cold intolerance, heat intolerance, polydipsia, polyphagia and polyuria.  Genitourinary:  Negative for decreased urine volume, dysuria, hematuria, vaginal bleeding and vaginal discharge.  Musculoskeletal:  Positive for back pain. Negative for gait problem.  Skin:  Negative for color change and rash.  Allergic/Immunologic: Positive for environmental allergies.  Neurological:  Negative for syncope, weakness and headaches.  Hematological:  Negative for adenopathy. Does not  bruise/bleed easily.  Psychiatric/Behavioral:  Negative for confusion. The patient is not nervous/anxious.   All other systems reviewed and are negative.  Current Outpatient Medications on File Prior to Visit  Medication Sig Dispense Refill   aspirin EC 81 MG tablet Take 81 mg by mouth  daily.     enalapril (VASOTEC) 20 MG tablet Take 1 tablet by mouth once daily 90 tablet 2   lovastatin (MEVACOR) 40 MG tablet TAKE 1 TABLET BY MOUTH AT BEDTIME 90 tablet 3   sodium fluoride (FLUORISHIELD) 1.1 % GEL dental gel SF 5000 Plus 1.1 % dental cream  BRUSH THOROUGHLY ONCE DAILY AT BEDTIME     verapamil (CALAN) 120 MG tablet Take 1 tablet by mouth once daily 90 tablet 2   vitamin C (ASCORBIC ACID) 500 MG tablet Take 500 mg by mouth daily.     vitamin E 400 UNIT capsule Take 400 Units by mouth daily.     No current facility-administered medications on file prior to visit.   Past Medical History:  Diagnosis Date   Chicken pox    Hypercholesteremia    Hypertension    Keratoconus    Left   Past Surgical History:  Procedure Laterality Date   BREAST SURGERY     biopsy   CORNEAL TRANSPLANT     Allergies  Allergen Reactions   Codeine Itching   Family History  Problem Relation Age of Onset   Hyperlipidemia Mother    Heart disease Mother    Stroke Mother    Hypertension Mother    Diabetes Mother    Hyperlipidemia Father    Heart disease Father    Stroke Father    Hypertension Father    Diabetes Father    Social History   Socioeconomic History   Marital status: Married    Spouse name: Latoya Thompson   Number of children: 0   Years of education: Not on file   Highest education level: Bachelor's degree (e.g., BA, AB, BS)  Occupational History   Occupation: Educator  Tobacco Use   Smoking status: Never   Smokeless tobacco: Never  Substance and Sexual Activity   Alcohol use: No   Drug use: No   Sexual activity: Not on file  Other Topics Concern   Not on file  Social History Narrative   Married, lives with husband Latoya Thompson in a 2 story home. Drinks 1/2 cup of coffee a day, rare soda or tea. Exercise regularly for at least 60 minutes.   Social Determinants of Health   Financial Resource Strain: Not on file  Food Insecurity: Not on file  Transportation Needs: Not on file   Physical Activity: Not on file  Stress: Not on file  Social Connections: Not on file   Vitals:   01/12/21 0742  BP: 134/80  Pulse: 87  Resp: 16  Temp: 98.1 F (36.7 C)  SpO2: 97%   Body mass index is 30.03 kg/m.  Wt Readings from Last 3 Encounters:  01/12/21 169 lb 8 oz (76.9 kg)  07/12/20 163 lb 4 oz (74 kg)  05/10/20 162 lb 3.2 oz (73.6 kg)   Physical Exam Vitals and nursing note reviewed.  Constitutional:      General: She is not in acute distress.    Appearance: She is well-developed.  HENT:     Head: Normocephalic and atraumatic.     Right Ear: Tympanic membrane, ear canal and external ear normal.     Left Ear: External ear normal.  Ears:     Comments: Left cerumen excess, could not see TM.    Mouth/Throat:     Mouth: Mucous membranes are moist.     Pharynx: Oropharynx is clear. Uvula midline.  Eyes:     Extraocular Movements:     Right eye: Normal extraocular motion.     Conjunctiva/sclera:     Right eye: Right conjunctiva is not injected. No exudate or hemorrhage.    Pupils: Pupils are equal, round, and reactive to light.      Comments: Left eye scarring changed,corneal opacity.  Neck:     Thyroid: No thyromegaly.     Trachea: No tracheal deviation.  Cardiovascular:     Rate and Rhythm: Normal rate and regular rhythm.     Pulses:          Dorsalis pedis pulses are 2+ on the right side and 2+ on the left side.     Heart sounds: No murmur heard. Pulmonary:     Effort: Pulmonary effort is normal. No respiratory distress.     Breath sounds: Normal breath sounds.  Abdominal:     Palpations: Abdomen is soft. There is no hepatomegaly or mass.     Tenderness: There is no abdominal tenderness.  Genitourinary:    Comments: Deferred to gyn. Musculoskeletal:     Lumbar back: No tenderness or bony tenderness.     Comments: No signs of synovitis appreciated.  Lymphadenopathy:     Cervical: No cervical adenopathy.     Upper Body:     Right upper body: No  supraclavicular adenopathy.     Left upper body: No supraclavicular adenopathy.  Skin:    General: Skin is warm.     Findings: No erythema or rash.  Neurological:     General: No focal deficit present.     Mental Status: She is alert and oriented to person, place, and time.     Cranial Nerves: No cranial nerve deficit.     Coordination: Coordination normal.     Gait: Gait normal.     Deep Tendon Reflexes:     Reflex Scores:      Bicep reflexes are 2+ on the right side and 2+ on the left side.      Patellar reflexes are 2+ on the right side and 2+ on the left side. Psychiatric:        Speech: Speech normal.     Comments: Well groomed, good eye contact.   ASSESSMENT AND PLAN:  Latoya Thompson was here today annual physical examination.  Orders Placed This Encounter  Procedures   DEXAScan   Flu Vaccine QUAD 37moIM (Fluarix, Fluzone & Alfiuria Quad PF)   Basic metabolic panel   Hepatic function panel   Hemoglobin A1c   Lipid panel   Lab Results  Component Value Date   CREATININE 1.00 01/12/2021   BUN 16 01/12/2021   NA 143 01/12/2021   K 3.9 01/12/2021   CL 108 01/12/2021   CO2 29 01/12/2021   Lab Results  Component Value Date   ALT 17 01/12/2021   AST 23 01/12/2021   ALKPHOS 110 01/12/2021   BILITOT 0.6 01/12/2021   Lab Results  Component Value Date   HGBA1C 5.9 01/12/2021   Lab Results  Component Value Date   CHOL 205 (H) 01/12/2021   HDL 49.20 01/12/2021   LDLCALC 136 (H) 01/12/2021   TRIG 100.0 01/12/2021   CHOLHDL 4 01/12/2021   Routine general medical examination at a  health care facility We discussed the importance of regular physical activity and healthy diet for prevention of chronic illness and/or complications. Preventive guidelines reviewed. Vaccination up to date. Ca++ and vit D supplementation recommended. Next CPE in a year.  The 10-year ASCVD risk score (Arnett DK, et al., 2019) is: 8.5%   Values used to calculate the score:      Age: 48 years     Sex: Female     Is Non-Hispanic African American: Yes     Diabetic: No     Tobacco smoker: No     Systolic Blood Pressure: 782 mmHg     Is BP treated: Yes     HDL Cholesterol: 49.2 mg/dL     Total Cholesterol: 205 mg/dL  Need for immunization against influenza -     Flu Vaccine QUAD 9moIM (Fluarix, Fluzone & Alfiuria Quad PF)  Asymptomatic postmenopausal estrogen deficiency -     DEXAScan; Future  Excessive cerumen in ear canal, left Continue avoiding Q-tips. Debrox 2-3 times per week may help. I do not think ear lavage is necessary at this time  -     carbamide peroxide (DEBROX) 6.5 % OTIC solution; Place 5 drops into the left ear 2 (two) times daily.  Hypercholesteremia Continue lovastatin 40 mg daily and low-fat diet. Further recommendation will be given according to lipid panel result.  Hypertension BP is adequately controlled. Continue verapamil 120 mg daily and enalapril 20 mg daily. Continue monitoring BP regularly. Eye exam is current.  Prediabetes Consistency with a healthy lifestyle encouraged for diabetes prevention. We discussed diagnostic criteria, further recommendation will be given according to A1c result.  GERD (gastroesophageal reflux disease) Symptomatic. Recommend resuming Protonix 20 mg daily. GERD precautions also recommended.  In regard to feeling tired and some back ache with certain chores around the house, history and examination do not suggest a serious process.  Recommend mentioning this to her cardiologist during her next appointment. Monitor for new symptoms.  Return in 6 months (on 07/12/2021).  Makoa Satz G. JMartinique MD  [facility].

## 2021-01-12 ENCOUNTER — Ambulatory Visit (INDEPENDENT_AMBULATORY_CARE_PROVIDER_SITE_OTHER): Payer: PPO | Admitting: Family Medicine

## 2021-01-12 ENCOUNTER — Other Ambulatory Visit: Payer: Self-pay

## 2021-01-12 ENCOUNTER — Encounter: Payer: Self-pay | Admitting: Family Medicine

## 2021-01-12 VITALS — BP 134/80 | HR 87 | Temp 98.1°F | Resp 16 | Ht 63.0 in | Wt 169.5 lb

## 2021-01-12 DIAGNOSIS — R7303 Prediabetes: Secondary | ICD-10-CM

## 2021-01-12 DIAGNOSIS — K219 Gastro-esophageal reflux disease without esophagitis: Secondary | ICD-10-CM | POA: Diagnosis not present

## 2021-01-12 DIAGNOSIS — I10 Essential (primary) hypertension: Secondary | ICD-10-CM | POA: Diagnosis not present

## 2021-01-12 DIAGNOSIS — E7800 Pure hypercholesterolemia, unspecified: Secondary | ICD-10-CM | POA: Diagnosis not present

## 2021-01-12 DIAGNOSIS — Z0001 Encounter for general adult medical examination with abnormal findings: Secondary | ICD-10-CM | POA: Diagnosis not present

## 2021-01-12 DIAGNOSIS — Z0000 Encounter for general adult medical examination without abnormal findings: Secondary | ICD-10-CM

## 2021-01-12 DIAGNOSIS — Z23 Encounter for immunization: Secondary | ICD-10-CM | POA: Diagnosis not present

## 2021-01-12 DIAGNOSIS — H6122 Impacted cerumen, left ear: Secondary | ICD-10-CM

## 2021-01-12 DIAGNOSIS — Z780 Asymptomatic menopausal state: Secondary | ICD-10-CM | POA: Diagnosis not present

## 2021-01-12 LAB — LIPID PANEL
Cholesterol: 205 mg/dL — ABNORMAL HIGH (ref 0–200)
HDL: 49.2 mg/dL (ref >39.00)
LDL Cholesterol: 136 mg/dL — ABNORMAL HIGH (ref 0–99)
NonHDL: 155.97
Total CHOL/HDL Ratio: 4
Triglycerides: 100 mg/dL (ref 0.0–149.0)
VLDL: 20 mg/dL (ref 0.0–40.0)

## 2021-01-12 LAB — HEPATIC FUNCTION PANEL
ALT: 17 U/L (ref 0–35)
AST: 23 U/L (ref 0–37)
Albumin: 4.3 g/dL (ref 3.5–5.2)
Alkaline Phosphatase: 110 U/L (ref 39–117)
Bilirubin, Direct: 0 mg/dL (ref 0.0–0.3)
Total Bilirubin: 0.6 mg/dL (ref 0.2–1.2)
Total Protein: 7.6 g/dL (ref 6.0–8.3)

## 2021-01-12 LAB — BASIC METABOLIC PANEL
BUN: 16 mg/dL (ref 6–23)
CO2: 29 mEq/L (ref 19–32)
Calcium: 9.4 mg/dL (ref 8.4–10.5)
Chloride: 108 mEq/L (ref 96–112)
Creatinine, Ser: 1 mg/dL (ref 0.40–1.20)
GFR: 61.18 mL/min (ref >60.00)
Glucose, Bld: 94 mg/dL (ref 70–99)
Potassium: 3.9 mEq/L (ref 3.5–5.1)
Sodium: 143 mEq/L (ref 135–145)

## 2021-01-12 LAB — HEMOGLOBIN A1C: Hgb A1c MFr Bld: 5.9 % (ref 4.6–6.5)

## 2021-01-12 MED ORDER — PANTOPRAZOLE SODIUM 20 MG PO TBEC: 20.0000 mg | DELAYED_RELEASE_TABLET | Freq: Every day | ORAL | 1 refills | Status: DC

## 2021-01-12 MED ORDER — DEBROX 6.5 % OT SOLN: 5.0000 [drp] | Freq: Two times a day (BID) | OTIC | 0 refills | Status: DC

## 2021-01-12 NOTE — Assessment & Plan Note (Signed)
Symptomatic. Recommend resuming Protonix 20 mg daily. GERD precautions also recommended.

## 2021-01-12 NOTE — Patient Instructions (Addendum)
A few things to remember from today's visit:  Routine general medical examination at a health care facility  Need for immunization against influenza - Plan: Flu Vaccine QUAD 29moIM (Fluarix, Fluzone & Alfiuria Quad PF)  Asymptomatic postmenopausal estrogen deficiency - Plan: DEXAScan  Prediabetes - Plan: Hemoglobin A1c  Hypercholesteremia - Plan: Hepatic function panel, Lipid panel  Primary hypertension - Plan: Basic metabolic panel  Tiredness you have with certain activities does not sound worrisome. Monitor for new symptoms. Resume Protonix 20 mg daily. Avoid food that can irritate your stomach.  If you need refills please call your pharmacy. Do not use My Chart to request refills or for acute issues that need immediate attention.   Please be sure medication list is accurate. If a new problem present, please set up appointment sooner than planned today.  Today you have you routine preventive visit.  At least 150 minutes of moderate exercise per week, daily brisk walking for 15-30 min is a good exercise option. Healthy diet low in saturated (animal) fats and sweets and consisting of fresh fruits and vegetables, lean meats such as fish and white chicken and whole grains.  These are some of recommendations for screening depending of age and risk factors:  - Vaccines:  Tdap vaccine every 10 years.  Shingles vaccine recommended at age 60 could be given after 60years of age but not sure about insurance coverage.   Pneumonia vaccines: Pneumovax at 60 Sometimes Pneumovax is giving earlier if history of smoking, lung disease,diabetes,kidney disease among some.  Screening for diabetes at age 60 and every 3 years.  Cervical cancer prevention:  Pap smear starts at 60years of age and continues periodically until 60years old in low risk women. Pap smear every 3 years between 60and 60years old. Pap smear every 3-5 years between women 60and older if pap smear negative and HPV  screening negative.  -Breast cancer: Mammogram: There is disagreement between experts about when to start screening in low risk asymptomatic female but recent recommendations are to start screening at 419and not later than 60years old , every 1-2 years and after 60yo q 2 years. Screening is recommended until 60years old but some women can continue screening depending of healthy issues.  Colon cancer screening: Has been recently changed to 60yo. Insurance may not cover until you are 60years old. Screening is recommended until 60years old.  Cholesterol disorder screening at age 60and every 3 years. N/A  Also recommended:  Dental visit- Brush and floss your teeth twice daily; visit your dentist twice a year. Eye doctor- Get an eye exam at least every 2 years. Helmet use- Always wear a helmet when riding a bicycle, motorcycle, rollerblading or skateboarding. Safe sex- If you may be exposed to sexually transmitted infections, use a condom. Seat belts- Seat belts can save your live; always wear one. Smoke/Carbon Monoxide detectors- These detectors need to be installed on the appropriate level of your home. Replace batteries at least once a year. Skin cancer- When out in the sun please cover up and use sunscreen 15 SPF or higher. Violence- If anyone is threatening or hurting you, please tell your healthcare provider.  Drink alcohol in moderation- Limit alcohol intake to one drink or less per day. Never drink and drive. Calcium supplementation 1000 to 1200 mg daily, ideally through your diet.  Vitamin D supplementation 800 units daily.

## 2021-01-12 NOTE — Assessment & Plan Note (Signed)
Consistency with a healthy lifestyle encouraged for diabetes prevention. We discussed diagnostic criteria, further recommendation will be given according to A1c result.

## 2021-01-12 NOTE — Assessment & Plan Note (Signed)
Continue lovastatin 40 mg daily and low-fat diet. Further recommendation will be given according to lipid panel result.

## 2021-01-12 NOTE — Assessment & Plan Note (Signed)
BP is adequately controlled. Continue verapamil 120 mg daily and enalapril 20 mg daily. Continue monitoring BP regularly. Eye exam is current.

## 2021-01-15 ENCOUNTER — Other Ambulatory Visit: Payer: Self-pay | Admitting: Obstetrics & Gynecology

## 2021-01-15 ENCOUNTER — Ambulatory Visit
Admission: RE | Admit: 2021-01-15 | Discharge: 2021-01-15 | Disposition: A | Discharge status: Home / Self Care | Payer: PPO | Source: Ambulatory Visit | Attending: Obstetrics & Gynecology | Admitting: Obstetrics & Gynecology

## 2021-01-15 DIAGNOSIS — R928 Other abnormal and inconclusive findings on diagnostic imaging of breast: Secondary | ICD-10-CM

## 2021-01-15 DIAGNOSIS — R922 Inconclusive mammogram: Secondary | ICD-10-CM | POA: Diagnosis not present

## 2021-02-01 ENCOUNTER — Other Ambulatory Visit: Payer: PPO

## 2021-02-21 ENCOUNTER — Other Ambulatory Visit: Payer: Self-pay | Admitting: Family Medicine

## 2021-02-21 DIAGNOSIS — K219 Gastro-esophageal reflux disease without esophagitis: Secondary | ICD-10-CM

## 2021-02-23 ENCOUNTER — Other Ambulatory Visit: Payer: Self-pay | Admitting: Family Medicine

## 2021-03-13 ENCOUNTER — Other Ambulatory Visit: Payer: Self-pay | Admitting: Family Medicine

## 2021-03-13 DIAGNOSIS — I10 Essential (primary) hypertension: Secondary | ICD-10-CM

## 2021-04-18 ENCOUNTER — Telehealth: Payer: Self-pay | Admitting: Pharmacist

## 2021-04-18 NOTE — Chronic Care Management (AMB) (Signed)
Chronic Care Management Pharmacy Assistant   Name: Latoya Thompson  MRN: [id number] DOB: [birth year]  Reason for Encounter: Disease State / Hypertension Assessment Call   Conditions to be addressed/monitored: HLD  Recent office visits:  01/12/2022 [name] - Patient was seen for a routine general medical examination and additional issues. Started Carbamide 6.5% 5 drops in left ear twice daily. Discontinue Singulair and Ambien. Follow up in 6 months.  Recent consult visits:  None  Hospital visits:  None  Medications: Outpatient Encounter Medications as of 04/18/2021  Medication Sig   aspirin EC 81 MG tablet Take 81 mg by mouth daily.   carbamide peroxide (DEBROX) 6.5 % OTIC solution Place 5 drops into the left ear 2 (two) times daily.   enalapril (VASOTEC) 20 MG tablet Take 1 tablet by mouth once daily   lovastatin (MEVACOR) 40 MG tablet TAKE 1 TABLET BY MOUTH AT BEDTIME   pantoprazole (PROTONIX) 20 MG tablet Take 1 tablet by mouth once daily   sodium fluoride (FLUORISHIELD) 1.1 % GEL dental gel SF 5000 Plus 1.1 % dental cream  BRUSH THOROUGHLY ONCE DAILY AT BEDTIME   verapamil (CALAN) 120 MG tablet Take 1 tablet by mouth once daily   vitamin C (ASCORBIC ACID) 500 MG tablet Take 500 mg by mouth daily.   vitamin E 400 UNIT capsule Take 400 Units by mouth daily.   No facility-administered encounter medications on file as of 04/18/2021.  Fill History: ENALAPRIL 20MG      TAB 12/05/2020 90   LOVASTATIN 40MG     TAB 01/19/2021 90   MONTELUKAST 10MG TAB 11/16/2020 90   PANTOPRAZOLE 20MG   TAB 02/22/2021 90   VERAPAMIL 120MG     TAB 12/05/2020 90   Reviewed chart prior to disease state call. Spoke with patient regarding BP  Recent Office Vitals: BP Readings from Last 3 Encounters:  01/12/21 134/80  07/12/20 124/80  05/10/20 120/90   Pulse Readings from Last 3 Encounters:  01/12/21 87  07/12/20 83  05/10/20 68    Wt Readings from Last 3 Encounters:  01/12/21  169 lb 8 oz (76.9 kg)  07/12/20 163 lb 4 oz (74 kg)  05/10/20 162 lb 3.2 oz (73.6 kg)     Kidney Function Lab Results  Component Value Date/Time   CREATININE 1.00 01/12/2021 08:37 AM   CREATININE 1.08 07/12/2020 08:40 AM   CREATININE 1.02 11/26/2019 08:31 AM   GFR 61.18 01/12/2021 08:37 AM   GFRNONAA 60 11/26/2019 08:31 AM   GFRAA 70 11/26/2019 08:31 AM    BMP Latest Ref Rng & Units 01/12/2021 07/12/2020 11/26/2019  Glucose 70 - 99 mg/dL 94 88 91  BUN 6 - 23 mg/dL 16 16 17   Creatinine 0.40 - 1.20 mg/dL 1.00 1.08 1.02  BUN/Creat Ratio 6 - 22 (calc) - - NOT APPLICABLE  Sodium 035 - 145 mEq/L 143 144 142  Potassium 3.5 - 5.1 mEq/L 3.9 4.1 4.6  Chloride 96 - 112 mEq/L 108 107 106  CO2 19 - 32 mEq/L 29 28 29   Calcium 8.4 - 10.5 mg/dL 9.4 9.8 9.8    Current antihypertensive regimen:  Enalapril 20 mg daily Verapamil 120 mg daily  How often are you checking your Blood Pressure? Patient states she is checking blood pressures 3-5x per week  Current home BP readings: Patient states her blood pressure readings have been 116/78 - 130/85  What recent interventions/DTPs have been made by any provider to improve Blood Pressure control since  last CPP Visit: No recent interventions.   Any recent hospitalizations or ED visits since last visit with CPP? No recent hospital visits.   What diet changes have been made to improve Blood Pressure Control?  Patient follows a low sodium diet Breakfast - patient will have oatmeal or cereal  Lunch - patient will have an apple or bowl of cereal  Dinner - patient will have a meal with a meat and vegetable  What exercise is being done to improve your Blood Pressure Control?   Patient is exercising daily, weights, walking, cardio  Adherence Review: Is the patient currently on ACE/ARB medication? Yes Does the patient have >5 day gap between last estimated fill dates? No  Care Gaps: AWV - completed 01/12/21 Last BP - 134/80 on 01/12/2021 Last A1C - 5.9  on 01/12/2021 Zoster vaccines - never done  Star Rating Drugs: Enalapril 86m - last filled on 03/18/2021 90DS at [facility] Lovastatin 422m- last filled on 01/19/2021 90DS at [facility] Verapamil 12040m [facility] with [place] [phone]

## 2021-05-02 ENCOUNTER — Telehealth: Payer: Self-pay | Admitting: Pharmacist

## 2021-05-02 NOTE — Chronic Care Management (AMB) (Signed)
° ° °  Chronic Care Management Pharmacy Assistant   Name: Latoya Thompson  MRN: [id number] DOB: 01-12-1961   Reason for Encounter: Reschedule appointment with Jeni Salles Rescheduled with patient to 08/19/2021.   Care Gaps: AWV - completed 01/12/21 Last BP - 134/80 on 01/12/2021 Last A1C - 5.9 on 01/12/2021 Zoster vaccines - never done   Star Rating Drugs: Enalapril 25m - last filled on 03/18/2021 90DS at [facility] Lovastatin 439m- last filled on 01/19/2021 90DS at [facility] Verapamil 12093m [facility] on [facility] with [place] Assistant [phone]

## 2021-06-12 ENCOUNTER — Other Ambulatory Visit: Payer: Self-pay | Admitting: Family Medicine

## 2021-06-12 DIAGNOSIS — I10 Essential (primary) hypertension: Secondary | ICD-10-CM

## 2021-06-15 ENCOUNTER — Ambulatory Visit (INDEPENDENT_AMBULATORY_CARE_PROVIDER_SITE_OTHER): Payer: PPO

## 2021-06-15 VITALS — Ht 63.0 in | Wt 169.0 lb

## 2021-06-15 DIAGNOSIS — Z0000 Encounter for general adult medical examination without abnormal findings: Secondary | ICD-10-CM

## 2021-06-15 NOTE — Progress Notes (Signed)
? ?Subjective:  ? Latoya Thompson is a 61 y.o. female who presents for Medicare Annual (Subsequent) preventive examination. ? ?Review of Systems    ?Virtual Visit via Telephone Note ? ?I connected with  Latoya Thompson on 06/15/21 at 10:30 AM EDT by telephone and verified that I am speaking with the correct person using two identifiers. ? ?Location: ?Patient: Home ?Provider: Office ?Persons participating in the virtual visit: patient/Nurse Health Advisor ?  ?I discussed the limitations, risks, security and privacy concerns of performing an evaluation and management service by telephone and the availability of in person appointments. The patient expressed understanding and agreed to proceed. ? ?Interactive audio and video telecommunications were attempted between this nurse and patient, however failed, due to patient having technical difficulties OR patient did not have access to video capability.  We continued and completed visit with audio only. ? ?Some vital signs may be absent or patient reported.  ? ?[name], LPN  ?Cardiac Risk Factors include: advanced age (>57mn, >>35women);hypertension ? ?   ?Objective:  ?  ?Today's Vitals  ? 06/15/21 1028  ?Weight: 169 lb (76.7 kg)  ?Height: 5' 3"  (1.6 m)  ? ?Body mass index is 29.94 kg/m?. ? ? ?  06/15/2021  ? 10:37 AM 12/10/2019  ?  2:51 PM 07/13/2016  ?  5:56 PM 05/07/2016  ?  2:43 PM 09/02/2015  ?  9:51 PM  ?Advanced Directives  ?Does Patient Have a Medical Advance Directive? No No No No No  ?Would patient like information on creating a medical advance directive? No - Patient declined Yes (MAU/Ambulatory/Procedural Areas - Information given) No - Patient declined  No - patient declined information  ? ? ?Current Medications (verified) ?Outpatient Encounter Medications as of 06/15/2021  ?Medication Sig  ? aspirin EC 81 MG tablet Take 81 mg by mouth daily.  ? carbamide peroxide (DEBROX) 6.5 % OTIC solution Place 5 drops into the left ear 2 (two) times daily.  ? enalapril  (VASOTEC) 20 MG tablet Take 1 tablet by mouth once daily  ? lovastatin (MEVACOR) 40 MG tablet TAKE 1 TABLET BY MOUTH AT BEDTIME  ? pantoprazole (PROTONIX) 20 MG tablet Take 1 tablet by mouth once daily  ? sodium fluoride (FLUORISHIELD) 1.1 % GEL dental gel SF 5000 Plus 1.1 % dental cream ? BRUSH THOROUGHLY ONCE DAILY AT BEDTIME  ? verapamil (CALAN) 120 MG tablet Take 1 tablet by mouth once daily  ? vitamin C (ASCORBIC ACID) 500 MG tablet Take 500 mg by mouth daily.  ? vitamin E 400 UNIT capsule Take 400 Units by mouth daily.  ? ?No facility-administered encounter medications on file as of 06/15/2021.  ? ? ?Allergies (verified) ?Codeine  ? ?History: ?Past Medical History:  ?Diagnosis Date  ? Chicken pox   ? Hypercholesteremia   ? Hypertension   ? 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 ?Past Surgical History:  ?Procedure Laterality Date  ? BREAST SURGERY    ? biopsy  ? CORNEAL TRANSPLANT    ? ?Family History  ?Problem Relation Age of Onset  ? Hyperlipidemia Mother   ? Heart disease Mother   ? Stroke Mother   ? Hypertension Mother   ? Diabetes Mother   ? Hyperlipidemia Father   ? Heart disease Father   ? Stroke Father   ? Hypertension Father   ? Diabetes Father   ? ?Social History  ? ?Socioeconomic History  ? Marital status: Married  ?  Spouse name: PEddie Dibbles ?  Number of children: 0  ? Years of education: Not on file  ? Highest education level: Bachelor's degree (e.g., BA, AB, BS)  ?Occupational History  ? Occupation: Tourist information centre manager  ?Tobacco Use  ? Smoking status: Never  ? Smokeless tobacco: Never  ?Substance and Sexual Activity  ? Alcohol use: No  ? Drug use: No  ? Sexual activity: Not on file  ?Other Topics Concern  ? Not on file  ?Social History Narrative  ? Married, lives with husband Eddie Dibbles in a 2 story home. Drinks 1/2 cup of coffee a day, rare soda or tea. Exercise regularly for at least 60 minutes.  ? ?Social Determinants of Health  ? ?Financial Resource Strain: Low Risk   ? Difficulty of Paying Living Expenses: Not hard at all  ?Food  Insecurity: No Food Insecurity  ? Worried About Charity fundraiser in the Last Year: Never true  ? Ran Out of Food in the Last Year: Never true  ?Transportation Needs: No Transportation Needs  ? Lack of Transportation (Medical): No  ? Lack of Transportation (Non-Medical): No  ?Physical Activity: Sufficiently Active  ? Days of Exercise per Week: 4 days  ? Minutes of Exercise per Session: 70 min  ?Stress: No Stress Concern Present  ? Feeling of Stress : Not at all  ?Social Connections: Socially Integrated  ? Frequency of Communication with Friends and Family: Twice a week  ? Frequency of Social Gatherings with Friends and Family: Once a week  ? Attends Religious Services: More than 4 times per year  ? Active Member of Clubs or Organizations: No  ? Attends Archivist Meetings: 1 to 4 times per year  ? Marital Status: Married  ? ?Clinical Intake: ? ?Pre-visit preparation completed: Yes ? ?Pain : No/denies pain ? ?BMI - recorded: 30.03 ?Nutritional Status: BMI > 30  Obese ?Nutritional Risks: None ?Diabetes: No ? ?How often do you need to have someone help you when you read instructions, pamphlets, or other written materials from your doctor or pharmacy?: 1 - Never ? ?Diabetic?  No ? ?Interpreter Needed?: No ? ?Activities of Daily Living ? ?  06/15/2021  ? 10:35 AM 06/12/2021  ? 11:16 AM  ?In your present state of health, do you have any difficulty performing the following activities:  ?Hearing? 0 0  ?Vision? 0 1  ?Difficulty concentrating or making decisions? 0 0  ?Walking or climbing stairs? 0 0  ?Dressing or bathing? 0 0  ?Doing errands, shopping? 0 0  ?Preparing Food and eating ? N N  ?Using the Toilet? N N  ?In the past six months, have you accidently leaked urine? N N  ?Do you have problems with loss of bowel control? N N  ?Managing your Medications? N N  ?Managing your Finances? N N  ?Housekeeping or managing your Housekeeping? N N  ? ? ?Patient Care Team: ?Martinique, Betty G, MD as PCP - General (Family  Medicine) ?Viona Gilmore, [facility] as Pharmacist (Pharmacist) ? ?Indicate any recent Medical Services you may have received from other than [facility] providers in the past year (date may be approximate). ? ?   ?Assessment:  ? This is a routine wellness examination for [place]ks. ? ?Hearing/Vision screen ?Hearing Screening - Comments:: No difficulty hearing ?Vision Screening - Comments:: Wears glasses. Followed by [facility] ? ?Dietary issues and exercise activities discussed: ?Exercise limited by: None identified ? Goals Addressed   ? ?  ?  ?  ?  ?  ? This  Visit's Progress  ?   Patient Stated (pt-stated)     ?   Lose 10 lbs. ?  ? ?  ? ?Depression Screen ? ?  06/15/2021  ? 10:32 AM 01/12/2021  ?  7:42 AM 12/10/2019  ?  2:48 PM 11/29/2019  ? 11:28 PM 11/28/2018  ?  8:43 PM 11/15/2016  ?  3:51 PM  ?PHQ 2/9 Scores  ?PHQ - 2 Score 0 0 0 0 0 0  ?  ?Fall Risk ? ?  06/15/2021  ? 10:36 AM 06/12/2021  ? 11:16 AM 11/28/2018  ?  8:43 PM  ?Fall Risk   ?Falls in the past year? 0 0 0  ?Number falls in past yr: 0  0  ?Injury with Fall? 0  0  ?Risk for fall due to : No Fall Risks    ?Follow up   Education provided  ? ? ?FALL RISK PREVENTION PERTAINING TO THE HOME: ? ?Any stairs in or around the home? Yes  ?If so, are there any without handrails? No  ?Home free of loose throw rugs in walkways, pet beds, electrical cords, etc? Yes  ?Adequate lighting in your home to reduce risk of falls? Yes  ? ?ASSISTIVE DEVICES UTILIZED TO PREVENT FALLS: ? ?Life alert? No  ?Use of a cane, walker or w/c? No  ?Grab bars in the bathroom? No  ?Shower chair or bench in shower? Yes  ?Elevated toilet seat or a handicapped toilet? No  ? ?TIMED UP AND GO: ? ?Was the test performed? No . Audio Visit ? ?Cognitive Function: ?  ?  ? ?  06/15/2021  ? 10:37 AM 12/10/2019  ?  2:59 PM  ?6CIT Screen  ?What Year? 0 points 0 points  ?What month? 0 points 0 points  ?What time? 0 points   ?Count back from 20 0 points 0 points  ?Months in reverse 0 points 0 points  ?Repeat phrase 0 points 0  points  ?Total Score 0 points   ? ? ?Immunizations ?Immunization History  ?Administered Date(s) Administered  ? Hep A / Hep B 11/29/2016  ? Influenza, Seasonal, Injecte, Preservative Fre 12/23/2014  ? Influenza,i

## 2021-06-15 NOTE — Patient Instructions (Addendum)
?Latoya Thompson , ?Thank you for taking time to come for your Medicare Wellness Visit. I appreciate your ongoing commitment to your health goals. Please review the following plan we discussed and let me know if I can assist you in the future.  ? ?These are the goals we discussed: ? Goals   ? ?   Patient Stated (pt-stated)   ?   Lose 10 lbs. ?  ? ?  ?  ?This is a list of the screening recommended for you and due dates:  ?Health Maintenance  ?Topic Date Due  ? Zoster (Shingles) Vaccine (1 of 2) 09/14/2021*  ? Flu Shot  10/11/2021  ? Tetanus Vaccine  02/08/2022  ? Mammogram  12/17/2022  ? Pap Smear  12/17/2023  ? Colon Cancer Screening  10/04/2025  ? COVID-19 Vaccine  Completed  ? Hepatitis C Screening: USPSTF Recommendation to screen - Ages 28-79 yo.  Completed  ? HIV Screening  Completed  ? HPV Vaccine  Aged Out  ?*Topic was postponed. The date shown is not the original due date.  ? ?Advanced directives: No  ? ?Conditions/risks identified: None ? ?Next appointment: Follow up in one year for your annual wellness visit  ? ? ?Preventive Care 48 Years and Older, Female ?Preventive care refers to lifestyle choices and visits with your health care provider that can promote health and wellness. ?What does preventive care include? ?A yearly physical exam. This is also called an annual well check. ?Dental exams once or twice a year. ?Routine eye exams. Ask your health care provider how often you should have your eyes checked. ?Personal lifestyle choices, including: ?Daily care of your teeth and gums. ?Regular physical activity. ?Eating a healthy diet. ?Avoiding tobacco and drug use. ?Limiting alcohol use. ?Practicing safe sex. ?Taking low-dose aspirin every day. ?Taking vitamin and mineral supplements as recommended by your health care provider. ?What happens during an annual well check? ?The services and screenings done by your health care provider during your annual well check will depend on your age, overall health,  lifestyle risk factors, and family history of disease. ?Counseling  ?Your health care provider may ask you questions about your: ?Alcohol use. ?Tobacco use. ?Drug use. ?Emotional well-being. ?Home and relationship well-being. ?Sexual activity. ?Eating habits. ?History of falls. ?Memory and ability to understand (cognition). ?Work and work Statistician. ?Reproductive health. ?Screening  ?You may have the following tests or measurements: ?Height, weight, and BMI. ?Blood pressure. ?Lipid and cholesterol levels. These may be checked every 5 years, or more frequently if you are over 53 years old. ?Skin check. ?Lung cancer screening. You may have this screening every year starting at age 68 if you have a 30-pack-year history of smoking and currently smoke or have quit within the past 15 years. ?Fecal occult blood test (FOBT) of the stool. You may have this test every year starting at age 43. ?Flexible sigmoidoscopy or colonoscopy. You may have a sigmoidoscopy every 5 years or a colonoscopy every 10 years starting at age 44. ?Hepatitis C blood test. ?Hepatitis B blood test. ?Sexually transmitted disease (STD) testing. ?Diabetes screening. This is done by checking your blood sugar (glucose) after you have not eaten for a while (fasting). You may have this done every 1-3 years. ?Bone density scan. This is done to screen for osteoporosis. You may have this done starting at age 37. ?Mammogram. This may be done every 1-2 years. Talk to your health care provider about how often you should have regular mammograms. ?Talk with your  health care provider about your test results, treatment options, and if necessary, the need for more tests. ?Vaccines  ?Your health care provider may recommend certain vaccines, such as: ?Influenza vaccine. This is recommended every year. ?Tetanus, diphtheria, and acellular pertussis (Tdap, Td) vaccine. You may need a Td booster every 10 years. ?Zoster vaccine. You may need this after age  39. ?Pneumococcal 13-valent conjugate (PCV13) vaccine. One dose is recommended after age 59. ?Pneumococcal polysaccharide (PPSV23) vaccine. One dose is recommended after age 34. ?Talk to your health care provider about which screenings and vaccines you need and how often you need them. ?This information is not intended to replace advice given to you by your health care provider. Make sure you discuss any questions you have with your health care provider. ?Document Released: 03/26/2015 Document Revised: 11/17/2015 Document Reviewed: 12/29/2014 ?Elsevier Interactive Patient Education ? 2017 [employer]. ? ?Fall Prevention in the Home ?Falls can cause injuries. They can happen to people of all ages. There are many things you can do to make your home safe and to help prevent falls. ?What can I do on the outside of my home? ?Regularly fix the edges of walkways and driveways and fix any cracks. ?Remove anything that might make you trip as you walk through a door, such as a raised step or threshold. ?Trim any bushes or trees on the path to your home. ?Use bright outdoor lighting. ?Clear any walking paths of anything that might make someone trip, such as rocks or tools. ?Regularly check to see if handrails are loose or broken. Make sure that both sides of any steps have handrails. ?Any raised decks and porches should have guardrails on the edges. ?Have any leaves, snow, or ice cleared regularly. ?Use sand or salt on walking paths during winter. ?Clean up any spills in your garage right away. This includes oil or grease spills. ?What can I do in the bathroom? ?Use night lights. ?Install grab bars by the toilet and in the tub and shower. Do not use towel bars as grab bars. ?Use non-skid mats or decals in the tub or shower. ?If you need to sit down in the shower, use a plastic, non-slip stool. ?Keep the floor dry. Clean up any water that spills on the floor as soon as it happens. ?Remove soap buildup in the tub or shower  regularly. ?Attach bath mats securely with double-sided non-slip rug tape. ?Do not have throw rugs and other things on the floor that can make you trip. ?What can I do in the bedroom? ?Use night lights. ?Make sure that you have a light by your bed that is easy to reach. ?Do not use any sheets or blankets that are too big for your bed. They should not hang down onto the floor. ?Have a firm chair that has side arms. You can use this for support while you get dressed. ?Do not have throw rugs and other things on the floor that can make you trip. ?What can I do in the kitchen? ?Clean up any spills right away. ?Avoid walking on wet floors. ?Keep items that you use a lot in easy-to-reach places. ?If you need to reach something above you, use a strong step stool that has a grab bar. ?Keep electrical cords out of the way. ?Do not use floor polish or wax that makes floors slippery. If you must use wax, use non-skid floor wax. ?Do not have throw rugs and other things on the floor that can make you trip. ?What  can I do with my stairs? ?Do not leave any items on the stairs. ?Make sure that there are handrails on both sides of the stairs and use them. Fix handrails that are broken or loose. Make sure that handrails are as long as the stairways. ?Check any carpeting to make sure that it is firmly attached to the stairs. Fix any carpet that is loose or worn. ?Avoid having throw rugs at the top or bottom of the stairs. If you do have throw rugs, attach them to the floor with carpet tape. ?Make sure that you have a light switch at the top of the stairs and the bottom of the stairs. If you do not have them, ask someone to add them for you. ?What else can I do to help prevent falls? ?Wear shoes that: ?Do not have high heels. ?Have rubber bottoms. ?Are comfortable and fit you well. ?Are closed at the toe. Do not wear sandals. ?If you use a stepladder: ?Make sure that it is fully opened. Do not climb a closed stepladder. ?Make sure that  both sides of the stepladder are locked into place. ?Ask someone to hold it for you, if possible. ?Clearly mark and make sure that you can see: ?Any grab bars or handrails. ?First and last steps. ?Where the edge of each step is.

## 2021-06-27 ENCOUNTER — Telehealth: Payer: PPO

## 2021-07-11 NOTE — Progress Notes (Deleted)
HPI:  Ms.Hamdi A Venturella is a 61 y.o. female, who is here today to follow on recent visit.  Review of Systems Rest see pertinent positives and negatives per HPI.  Current Outpatient Medications on File Prior to Visit  Medication Sig Dispense Refill   aspirin EC 81 MG tablet Take 81 mg by mouth daily.     carbamide peroxide (DEBROX) 6.5 % OTIC solution Place 5 drops into the left ear 2 (two) times daily. 15 mL 0   enalapril (VASOTEC) 20 MG tablet Take 1 tablet by mouth once daily 90 tablet 0   lovastatin (MEVACOR) 40 MG tablet TAKE 1 TABLET BY MOUTH AT BEDTIME 90 tablet 2   pantoprazole (PROTONIX) 20 MG tablet Take 1 tablet by mouth once daily 90 tablet 3   sodium fluoride (FLUORISHIELD) 1.1 % GEL dental gel SF 5000 Plus 1.1 % dental cream  BRUSH THOROUGHLY ONCE DAILY AT BEDTIME     verapamil (CALAN) 120 MG tablet Take 1 tablet by mouth once daily 90 tablet 0   vitamin C (ASCORBIC ACID) 500 MG tablet Take 500 mg by mouth daily.     vitamin E 400 UNIT capsule Take 400 Units by mouth daily.     No current facility-administered medications on file prior to visit.    Past Medical History:  Diagnosis Date   Chicken pox    Hypercholesteremia    Hypertension    Keratoconus    Left   Allergies  Allergen Reactions   Codeine Itching    Social History   Socioeconomic History   Marital status: Married    Spouse name: Eddie Dibbles   Number of children: 0   Years of education: Not on file   Highest education level: Bachelor's degree (e.g., BA, AB, BS)  Occupational History   Occupation: Educator  Tobacco Use   Smoking status: Never   Smokeless tobacco: Never  Substance and Sexual Activity   Alcohol use: No   Drug use: No   Sexual activity: Not on file  Other Topics Concern   Not on file  Social History Narrative   Married, lives with husband Eddie Dibbles in a 2 story home. Drinks 1/2 cup of coffee a day, rare soda or tea. Exercise regularly for at least 60 minutes.   Social  Determinants of Health   Financial Resource Strain: Low Risk    Difficulty of Paying Living Expenses: Not hard at all  Food Insecurity: No Food Insecurity   Worried About Charity fundraiser in the Last Year: Never true   [place] in the Last Year: Never true  Transportation Needs: No Transportation Needs   Lack of Transportation (Medical): No   Lack of Transportation (Non-Medical): No  Physical Activity: Sufficiently Active   Days of Exercise per Week: 4 days   Minutes of Exercise per Session: 60 min  Stress: No Stress Concern Present   Feeling of Stress : Not at all  Social Connections: Socially Integrated   Frequency of Communication with Friends and Family: More than three times a week   Frequency of Social Gatherings with Friends and Family: More than three times a week   Attends Religious Services: More than 4 times per year   Active Member of [employer] or Organizations: Yes   Attends Archivist Meetings: More than 4 times per year   Marital Status: Married    There were no vitals filed for this visit. There is no height or weight on file  to calculate BMI.  Physical Exam  ASSESSMENT AND PLAN:   There are no diagnoses linked to this encounter.  No orders of the defined types were placed in this encounter.   No problem-specific Assessment & Plan notes found for this encounter.   No follow-ups on file.   Betty G. Martinique, MD  [facility]. [place] office.

## 2021-07-12 ENCOUNTER — Ambulatory Visit: Payer: PPO | Admitting: Family Medicine

## 2021-07-18 ENCOUNTER — Other Ambulatory Visit: Payer: PPO

## 2021-07-19 NOTE — Progress Notes (Signed)
? ?HPI: ?[name] is a 61 y.o. female, who is here today for follow up. ? ?Last seen on 01/12/21 ?No new problems since her last visit. ? ?Hypertension:  ?Medications: Enalapril 20 mg daily and Verapamil 120 mg daily. ?BP readings at home:110's-130/80's. ?Side effects:none ? ?Negative for unusual or severe headache, exertional chest pain, dyspnea,  focal weakness, or edema. ? ?Lab Results  ?Component Value Date  ? CREATININE 1.00 01/12/2021  ? BUN 16 01/12/2021  ? NA 143 01/12/2021  ? K 3.9 01/12/2021  ? CL 108 01/12/2021  ? CO2 29 01/12/2021  ? ?She would like all labs  repeated today. ? ?Hyperlipidemia: ?Currently on Lovastatin 40 mg daily. ?Following a low fat diet: Yes. ?Still exercising regularly, she goes to the gym a few times per week.Marland Kitchen ?Side effects from medication:none ?Lab Results  ?Component Value Date  ? CHOL 205 (H) 01/12/2021  ? HDL 49.20 01/12/2021  ? LDLCALC 136 (H) 01/12/2021  ? TRIG 100.0 01/12/2021  ? CHOLHDL 4 01/12/2021  ? ?-Left hand tremor, usually when trying to type right after manual extraneous activitiy like cleaning, not as bad if she rests for over 30 min. It happens through the day.  ?Stable. ?LE cramps once in a while. No erythema or pain. ? ?Would like to have ears check, Hx of cerumen impaction, she is asymptomatic. ? ?Abnormal LFT's/NASH: Negative for abdominal pain,nausea,or jaundice. ?Hepatic serology is negative for autoimmune hepatitis, hemochromatosis, PBC, A1 antitrypsin deficiency, Wilson's and sprue in 2017. 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 Several hepatic cysts . ? ?Lab Results  ?Component Value Date  ? ALT 17 01/12/2021  ? AST 23 01/12/2021  ? ALKPHOS 110 01/12/2021  ? BILITOT 0.6 01/12/2021  ? ?Prediabetes: Negative for polydipsia,polyuria, or polyphagia. ? ?Lab Results  ?Component Value Date  ? HGBA1C 5.9 01/12/2021  ? ?She needs form completed. She is graduating from school, completed teaching  program. ?She needs TB test done. ? ?Review of Systems  ?Constitutional:  Negative for activity change, appetite change and fever.  ?HENT:  Negative for mouth sores, nosebleeds and trouble swallowing.   ?Respiratory:  Negative for cough and wheezing.   ?Gastrointestinal:  Negative for abdominal pain, nausea and vomiting.  ?     Negative for changes in bowel habits.  ?Genitourinary:  Negative for decreased urine volume and hematuria.  ?Neurological:  Negative for syncope, facial asymmetry and weakness.  ?Rest of ROS see pertinent positives and negatives in HPI. ? ?Current Outpatient Medications on File Prior to Visit  ?Medication Sig Dispense Refill  ? aspirin EC 81 MG tablet Take 81 mg by mouth daily.    ? enalapril (VASOTEC) 20 MG tablet Take 1 tablet by mouth once daily 90 tablet 0  ? lovastatin (MEVACOR) 40 MG tablet TAKE 1 TABLET BY MOUTH AT BEDTIME 90 tablet 2  ? pantoprazole (PROTONIX) 20 MG tablet Take 1 tablet by mouth once daily 90 tablet 3  ? sodium fluoride (FLUORISHIELD) 1.1 % GEL dental gel SF 5000 Plus 1.1 % dental cream ? BRUSH THOROUGHLY ONCE DAILY AT BEDTIME    ? verapamil (CALAN) 120 MG tablet Take 1 tablet by mouth once daily 90 tablet 0  ? vitamin C (ASCORBIC ACID) 500 MG tablet Take 500 mg by mouth daily.    ? vitamin E 400 UNIT capsule Take 400 Units by mouth daily.    ? ?No current facility-administered medications on file prior to visit.  ? ?  Past Medical History:  ?Diagnosis Date  ? Chicken pox   ? Hypercholesteremia   ? Hypertension   ? 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 ?Allergies  ?Allergen Reactions  ? Codeine Itching  ? ? ?Social History  ? ?Socioeconomic History  ? Marital status: Married  ?  Spouse name: Eddie Dibbles  ? Number of children: 0  ? Years of education: Not on file  ? Highest education level: Bachelor's degree (e.g., BA, AB, BS)  ?Occupational History  ? Occupation: Tourist information centre manager  ?Tobacco Use  ? Smoking status: Never  ? Smokeless tobacco: Never  ?Substance and Sexual Activity  ? Alcohol use: No  ?  Drug use: No  ? Sexual activity: Not on file  ?Other Topics Concern  ? Not on file  ?Social History Narrative  ? Married, lives with husband Eddie Dibbles in a 2 story home. Drinks 1/2 cup of coffee a day, rare soda or tea. Exercise regularly for at least 60 minutes.  ? ?Social Determinants of Health  ? ?Financial Resource Strain: Low Risk   ? Difficulty of Paying Living Expenses: Not hard at all  ?Food Insecurity: No Food Insecurity  ? Worried About Charity fundraiser in the Last Year: Never true  ? Ran Out of Food in the Last Year: Never true  ?Transportation Needs: No Transportation Needs  ? Lack of Transportation (Medical): No  ? Lack of Transportation (Non-Medical): No  ?Physical Activity: Sufficiently Active  ? Days of Exercise per Week: 4 days  ? Minutes of Exercise per Session: 60 min  ?Stress: No Stress Concern Present  ? Feeling of Stress : Not at all  ?Social Connections: Socially Integrated  ? Frequency of Communication with Friends and Family: More than three times a week  ? Frequency of Social Gatherings with Friends and Family: More than three times a week  ? Attends Religious Services: More than 4 times per year  ? Active Member of Clubs or Organizations: Yes  ? Attends Archivist Meetings: More than 4 times per year  ? Marital Status: Married  ? ? ?Vitals:  ? 07/20/21 0729  ?BP: 130/80  ?Pulse: 87  ?Resp: 16  ?Temp: 98.1 ?F (36.7 ?C)  ?SpO2: 98%  ? ?Wt Readings from Last 3 Encounters:  ?07/20/21 166 lb 2 oz (75.4 kg)  ?06/15/21 169 lb (76.7 kg)  ?01/12/21 169 lb 8 oz (76.9 kg)  ?Body mass index is 29.43 kg/m?. ? ?Physical Exam ?Vitals and nursing note reviewed.  ?Constitutional:   ?   General: She is not in acute distress. ?   Appearance: She is well-developed.  ?HENT:  ?   Head: Normocephalic and atraumatic.  ?   Right Ear: Hearing, tympanic membrane and ear canal normal.  ?   Left Ear: Hearing and external ear normal.  ?   Ears:  ?   Comments: Cerumen excess ,left. I cannot see TM ?    Mouth/Throat:  ?   Mouth: Mucous membranes are moist.  ?   Pharynx: Oropharynx is clear.  ?Eyes:  ?   Conjunctiva/sclera:  ?   Right eye: Right conjunctiva is not injected.  ?Cardiovascular:  ?   Rate and Rhythm: Normal rate and regular rhythm.  ?   Pulses:     ?     Dorsalis pedis pulses are 2+ on the right side and 2+ on the left side.  ?   Heart sounds: No murmur heard. ?Pulmonary:  ?   Effort: Pulmonary effort is  normal. No respiratory distress.  ?   Breath sounds: Normal breath sounds.  ?Abdominal:  ?   Palpations: Abdomen is soft. There is no hepatomegaly or mass.  ?   Tenderness: There is no abdominal tenderness.  ?Lymphadenopathy:  ?   Cervical: No cervical adenopathy.  ?Skin: ?   General: Skin is warm.  ?   Findings: No erythema or rash.  ?Neurological:  ?   General: No focal deficit present.  ?   Mental Status: She is alert and oriented to person, place, and time.  ?   Cranial Nerves: No cranial nerve deficit.  ?   Motor: Tremor (Minimal left hand tremor, not present at rest) present. No pronator drift.  ?   Gait: Gait normal.  ?   Comments: .  ?Psychiatric:  ?   Comments: Well groomed, good eye contact.  ? ?ASSESSMENT AND PLAN: ? ?[name] was seen today for follow-up. ? ?Diagnoses and all orders for this visit: ?Orders Placed This Encounter  ?Procedures  ? US Abdomen Limited RUQ (LIVER/GB)  ? Comprehensive metabolic panel  ? Hemoglobin A1c  ? Lipid panel  ? QuantiFERON-TB Gold Plus  ? ?Lab Results  ?Component Value Date  ? HGBA1C 5.9 07/20/2021  ? ?Lab Results  ?Component Value Date  ? CREATININE 1.09 07/20/2021  ? BUN 15 07/20/2021  ? NA 142 07/20/2021  ? K 3.5 07/20/2021  ? CL 107 07/20/2021  ? CO2 28 07/20/2021  ? ?Lab Results  ?Component Value Date  ? ALT 16 07/20/2021  ? AST 25 07/20/2021  ? ALKPHOS 100 07/20/2021  ? BILITOT 0.7 07/20/2021  ? ?Lab Results  ?Component Value Date  ? CHOL 193 07/20/2021  ? HDL 52.30 07/20/2021  ? LDLCALC 127 (H) 07/20/2021  ? TRIG 69.0 07/20/2021  ? CHOLHDL 4  07/20/2021  ? ?Excessive cerumen in ear canal, left ?Avoid Q tips. ?OTC Debrox 2-3 times per week may help. ? ?Screening-pulmonary TB ?-     QuantiFERON-TB Gold Plus ? ?Hyperlipidemia, unspecified ?Continue Lovastatin 40 mg dai

## 2021-07-20 ENCOUNTER — Encounter: Payer: Self-pay | Admitting: Family Medicine

## 2021-07-20 ENCOUNTER — Ambulatory Visit (INDEPENDENT_AMBULATORY_CARE_PROVIDER_SITE_OTHER): Payer: PPO | Admitting: Family Medicine

## 2021-07-20 VITALS — BP 130/80 | HR 87 | Temp 98.1°F | Resp 16 | Ht 63.0 in | Wt 166.1 lb

## 2021-07-20 DIAGNOSIS — E7800 Pure hypercholesterolemia, unspecified: Secondary | ICD-10-CM

## 2021-07-20 DIAGNOSIS — H6122 Impacted cerumen, left ear: Secondary | ICD-10-CM

## 2021-07-20 DIAGNOSIS — R251 Tremor, unspecified: Secondary | ICD-10-CM | POA: Diagnosis not present

## 2021-07-20 DIAGNOSIS — R7303 Prediabetes: Secondary | ICD-10-CM

## 2021-07-20 DIAGNOSIS — E785 Hyperlipidemia, unspecified: Secondary | ICD-10-CM | POA: Diagnosis not present

## 2021-07-20 DIAGNOSIS — K7581 Nonalcoholic steatohepatitis (NASH): Secondary | ICD-10-CM | POA: Diagnosis not present

## 2021-07-20 DIAGNOSIS — I10 Essential (primary) hypertension: Secondary | ICD-10-CM | POA: Diagnosis not present

## 2021-07-20 DIAGNOSIS — Z111 Encounter for screening for respiratory tuberculosis: Secondary | ICD-10-CM | POA: Diagnosis not present

## 2021-07-20 LAB — COMPREHENSIVE METABOLIC PANEL
ALT: 16 U/L (ref 0–35)
AST: 25 U/L (ref 0–37)
Albumin: 4.3 g/dL (ref 3.5–5.2)
Alkaline Phosphatase: 100 U/L (ref 39–117)
BUN: 15 mg/dL (ref 6–23)
CO2: 28 mEq/L (ref 19–32)
Calcium: 9.4 mg/dL (ref 8.4–10.5)
Chloride: 107 mEq/L (ref 96–112)
Creatinine, Ser: 1.09 mg/dL (ref 0.40–1.20)
GFR: 54.97 mL/min — ABNORMAL LOW (ref >60.00)
Glucose, Bld: 87 mg/dL (ref 70–99)
Potassium: 3.5 mEq/L (ref 3.5–5.1)
Sodium: 142 mEq/L (ref 135–145)
Total Bilirubin: 0.7 mg/dL (ref 0.2–1.2)
Total Protein: 7.5 g/dL (ref 6.0–8.3)

## 2021-07-20 LAB — LIPID PANEL
Cholesterol: 193 mg/dL (ref 0–200)
HDL: 52.3 mg/dL (ref >39.00)
LDL Cholesterol: 127 mg/dL — ABNORMAL HIGH (ref 0–99)
NonHDL: 140.98
Total CHOL/HDL Ratio: 4
Triglycerides: 69 mg/dL (ref 0.0–149.0)
VLDL: 13.8 mg/dL (ref 0.0–40.0)

## 2021-07-20 LAB — HEMOGLOBIN A1C: Hgb A1c MFr Bld: 5.9 % (ref 4.6–6.5)

## 2021-07-20 NOTE — Assessment & Plan Note (Addendum)
Problem has been stable otherwise. ?Continue Vit E supplementation. ?RUQ [place] for f/u will be arranged. ?Further recommendations according to lab results. ?

## 2021-07-20 NOTE — Assessment & Plan Note (Signed)
Continue Lovastatin 40 mg daily and low fat diet. ?Further recommendations according to FLP results. ?

## 2021-07-20 NOTE — Assessment & Plan Note (Signed)
BP adequately controlled. ?Continue  Enalapril 20 mg daily and Verapamil 120 mg daily. ?Continue monitoring BP regularly and low salt diet. ?Eye exam is current. ?

## 2021-07-20 NOTE — Assessment & Plan Note (Signed)
Continue a healthy life style for diabetes. ?Further recommendations according to HgA1C result. ?

## 2021-07-20 NOTE — Patient Instructions (Addendum)
A few things to remember from today's visit: ? ? ?Primary hypertension ? ?Hypercholesteremia - Plan: Comprehensive metabolic panel, Lipid panel ? ?Prediabetes - Plan: Hemoglobin A1c ? ?Excessive cerumen in ear canal, left ? ?Tremor, unspecified ? ?NASH (nonalcoholic steatohepatitis) - Plan: Comprehensive metabolic panel ? ?Screening-pulmonary TB - Plan: QuantiFERON-TB Gold Plus ? ?Hyperlipidemia, unspecified hyperlipidemia type ? ?If you need refills please call your pharmacy. ?Do not use My Chart to request refills or for acute issues that need immediate attention. ?  ?Please be sure medication list is accurate. ?If a new problem present, please set up appointment sooner than planned today. ? ?No changes in medication today. ?Left hand tremor doe snot seem concerning, let me know of you want neuro evaluation. ?No Q tips. Debrox in left ear 2-3 times per week may help. ? ? ? ? ? ? ?

## 2021-07-20 NOTE — Assessment & Plan Note (Signed)
It has been stable. ?We reviewed possible etiologies, prognosis,and treatment options. ?For now it is mild,I do not think treatment is necessary. ?She prefers to hold on neuro evaluation. ?Monitor for new symptoms. ?

## 2021-07-23 LAB — QUANTIFERON-TB GOLD PLUS
Mitogen-NIL: >10 [IU]/mL
NIL: 0.06 [IU]/mL
QuantiFERON-TB Gold Plus: NEGATIVE
TB1-NIL: 0 [IU]/mL
TB2-NIL: 0 [IU]/mL

## 2021-07-27 ENCOUNTER — Ambulatory Visit
Admission: RE | Admit: 2021-07-27 | Discharge: 2021-07-27 | Disposition: A | Discharge status: Home / Self Care | Payer: PPO | Source: Ambulatory Visit | Attending: Obstetrics & Gynecology | Admitting: Obstetrics & Gynecology

## 2021-07-27 DIAGNOSIS — N6012 Diffuse cystic mastopathy of left breast: Secondary | ICD-10-CM | POA: Diagnosis not present

## 2021-07-27 DIAGNOSIS — R928 Other abnormal and inconclusive findings on diagnostic imaging of breast: Secondary | ICD-10-CM

## 2021-07-28 ENCOUNTER — Ambulatory Visit
Admission: RE | Admit: 2021-07-28 | Discharge: 2021-07-28 | Disposition: A | Discharge status: Home / Self Care | Payer: PPO | Source: Ambulatory Visit | Attending: Family Medicine | Admitting: Family Medicine

## 2021-07-28 DIAGNOSIS — K7689 Other specified diseases of liver: Secondary | ICD-10-CM | POA: Diagnosis not present

## 2021-07-28 DIAGNOSIS — K7581 Nonalcoholic steatohepatitis (NASH): Secondary | ICD-10-CM

## 2021-08-02 ENCOUNTER — Other Ambulatory Visit: Payer: Self-pay | Admitting: Family Medicine

## 2021-08-02 DIAGNOSIS — R160 Hepatomegaly, not elsewhere classified: Secondary | ICD-10-CM

## 2021-08-02 DIAGNOSIS — K769 Liver disease, unspecified: Secondary | ICD-10-CM

## 2021-08-09 NOTE — Progress Notes (Signed)
ACUTE VISIT Chief Complaint  Patient presents with   Tinnitus    Right ear, about a week & half   HPI: Latoya Thompson is a 61 y.o. female with hx of HTN,LBBB,HLD,and NASH here today complaining of right ear tinnitus as described above. She has had problem intermittently for years. She has had ENT evaluation and carotid US in 2019. 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   She has not identified exacerbating or alleviating factors. It is intermittent, did not have it last night and started again this morning. It does not interfere with sleep. Negative for fever,abdominal wt loss, headache,changes in hearing ,or dizziness. 3 days ago she had mild earache that did not last. No recent URI or travel.   She had labs and RUQ [place] recently. She has questions about liver imaging findings and labs. NASH, LFT's has been in normal range for the past couple year.She wonders if she needs to add medication for this problem. She is on Vit E. RUQ [place] on 07/28/21 showed right liver hepatic mass 2.1 x 2.2 x 2.3, liver MRI order has been placed and she already has appt information. Negative for abdominal pain,nausea,or jaundice. Evaluated by GI in 12/2015 due to abnormal LFT's, she had an MRI done at that time that showed hepatic hemangioma. According to GI notes hepatic serology is negative for autoimmune hepatitis, hemochromatosis, PBC, A1 antitrypsin deficiency, Wilson's and sprue. Lab Results  Component Value Date   ALT 16 07/20/2021   AST 25 07/20/2021   ALKPHOS 100 07/20/2021   BILITOT 0.7 07/20/2021   HLD on Lovastatin 40 mg daily. Dilated cardiomyopathy, follows with cardiologist annually. Echo 04/2020 LVEF 55-60%, left ventricular hypertrophy of the basal-septal segment. Left ventricular diastolic parameters are indeterminate. Abnormal (paradoxical) septal motion, consistent with left bundle branch block. Negative for CP,dyspnea,palpitations,edema,PND,or orthopnea.  Lab Results  Component  Value Date   CHOL 193 07/20/2021   HDL 52.30 07/20/2021   LDLCALC 127 (H) 07/20/2021   TRIG 69.0 07/20/2021   CHOLHDL 4 07/20/2021   She is also asking about breast reduction. She has had intermittent pruritus under breast and some upper back pain. Negative for rash, breast masses,or nipple discharge. Dx mammogram 07/27/21 was Birads 2. Screening mammogram 12/2020. She has appt for left breast dx mammogram tomorrow.  Review of Systems  Constitutional:  Negative for activity change, appetite change and fever.  HENT:  Negative for congestion, mouth sores and nosebleeds.   Eyes:  Negative for redness and visual disturbance.  Respiratory:  Negative for cough and wheezing.   Gastrointestinal:  Negative for vomiting.       Negative for changes in bowel habits.  Neurological:  Negative for syncope and weakness.  Rest see pertinent positives and negatives per HPI.  Current Outpatient Medications on File Prior to Visit  Medication Sig Dispense Refill   aspirin EC 81 MG tablet Take 81 mg by mouth daily.     enalapril (VASOTEC) 20 MG tablet Take 1 tablet by mouth once daily 90 tablet 0   lovastatin (MEVACOR) 40 MG tablet TAKE 1 TABLET BY MOUTH AT BEDTIME 90 tablet 2   pantoprazole (PROTONIX) 20 MG tablet Take 1 tablet by mouth once daily 90 tablet 3   sodium fluoride (FLUORISHIELD) 1.1 % GEL dental gel SF 5000 Plus 1.1 % dental cream  BRUSH THOROUGHLY ONCE DAILY AT BEDTIME     verapamil (CALAN) 120 MG tablet Take 1 tablet by mouth once daily 90 tablet 0   vitamin  C (ASCORBIC ACID) 500 MG tablet Take 500 mg by mouth daily.     vitamin E 400 UNIT capsule Take 400 Units by mouth daily.     No current facility-administered medications on file prior to visit.   Past Medical History:  Diagnosis Date   Chicken pox    Hypercholesteremia    Hypertension    Keratoconus    Left   Allergies  Allergen Reactions   Codeine Itching    Social History   Socioeconomic History   Marital status:  Married    Spouse name: Eddie Dibbles   Number of children: 0   Years of education: Not on file   Highest education level: Bachelor's degree (e.g., BA, AB, BS)  Occupational History   Occupation: Educator  Tobacco Use   Smoking status: Never   Smokeless tobacco: Never  Substance and Sexual Activity   Alcohol use: No   Drug use: No   Sexual activity: Not on file  Other Topics Concern   Not on file  Social History Narrative   Married, lives with husband Eddie Dibbles in a 2 story home. Drinks 1/2 cup of coffee a day, rare soda or tea. Exercise regularly for at least 60 minutes.   Social Determinants of Health   Financial Resource Strain: Low Risk    Difficulty of Paying Living Expenses: Not hard at all  Food Insecurity: No Food Insecurity   Worried About Charity fundraiser in the Last Year: Never true   [place] in the Last Year: Never true  Transportation Needs: No Transportation Needs   Lack of Transportation (Medical): No   Lack of Transportation (Non-Medical): No  Physical Activity: Sufficiently Active   Days of Exercise per Week: 4 days   Minutes of Exercise per Session: 60 min  Stress: No Stress Concern Present   Feeling of Stress : Not at all  Social Connections: Socially Integrated   Frequency of Communication with Friends and Family: More than three times a week   Frequency of Social Gatherings with Friends and Family: More than three times a week   Attends Religious Services: More than 4 times per year   Active Member of Clubs or Organizations: Yes   Attends Archivist Meetings: More than 4 times per year   Marital Status: Married   Vitals:   08/10/21 1120  BP: 126/80  Pulse: 82  Resp: 16  SpO2: 97%   Body mass index is 29.6 kg/m.  Physical Exam Vitals and nursing note reviewed.  Constitutional:      General: She is not in acute distress.    Appearance: She is well-developed.  HENT:     Head: Normocephalic and atraumatic.     Right Ear: Hearing,  tympanic membrane, ear canal and external ear normal.     Left Ear: Hearing and external ear normal.     Ears:     Comments: Left ear canal cerumen excess, TM seen partially.    Mouth/Throat:     Mouth: Mucous membranes are moist.     Pharynx: Oropharynx is clear.  Eyes:     Conjunctiva/sclera: Conjunctivae normal.  Neck:     Vascular: No carotid bruit.  Cardiovascular:     Rate and Rhythm: Normal rate and regular rhythm.     Heart sounds: No murmur heard. Pulmonary:     Effort: Pulmonary effort is normal. No respiratory distress.     Breath sounds: Normal breath sounds.  Lymphadenopathy:  Cervical: No cervical adenopathy.  Skin:    General: Skin is warm.     Findings: No erythema or rash.  Neurological:     General: No focal deficit present.     Mental Status: She is alert and oriented to person, place, and time.     Cranial Nerves: No cranial nerve deficit.     Motor: No tremor or pronator drift.     Gait: Gait normal.  Psychiatric:     Comments: Well groomed, good eye contact.   ASSESSMENT AND PLAN:  Ms.Alyzah was seen today for tinnitus.  Diagnoses and all orders for this visit:  Tinnitus, right ear We discussed Dx,prognosis, and differential dx. This is a chronic problems, hx and examination do not suggest a serious process. White background noise at night may help. No other effective treatments available for tinnitus. Offered another ENT evaluation but she prefers to wait for now. Instructed about warning signs. Hearing Screening   500Hz  1000Hz  2000Hz  4000Hz   Right ear Pass Pass Pass Pass  Left ear Pass Pass Pass Pass   Liver mass We reviewed liver US and discussed possible etiologies, most likely benign lesion. Further recommendations will be given according to liver MRI report.  NASH (nonalcoholic steatohepatitis) LFT's has been normal for a few years. Continue vit E 400 IU bid and avoid alcohol intake as well as potential hepatotoxic medications. We  will continue following regularly. She prefers hold on GI referral.  Hyperlipidemia, unspecified hyperlipidemia type Some numbers improved, LDL went from 136 to 127. Continue Lovastatin 40 mg daily and low fat diet.  Dilated cardiomyopathy [facility]) Follows with cardiologist annually, saw Dr Johnsie Cancel in 04/2020. Continue low salt diet and Enalapril same dose.  Return if symptoms worsen or fail to improve, for Keep next appt.  Setsuko Robins G. [place], MD  [facility]. [place] office.

## 2021-08-10 ENCOUNTER — Ambulatory Visit (INDEPENDENT_AMBULATORY_CARE_PROVIDER_SITE_OTHER): Payer: PPO | Admitting: Family Medicine

## 2021-08-10 ENCOUNTER — Encounter: Payer: Self-pay | Admitting: Family Medicine

## 2021-08-10 VITALS — BP 126/80 | HR 82 | Resp 16 | Ht 63.0 in | Wt 167.1 lb

## 2021-08-10 DIAGNOSIS — H9311 Tinnitus, right ear: Secondary | ICD-10-CM | POA: Diagnosis not present

## 2021-08-10 DIAGNOSIS — E785 Hyperlipidemia, unspecified: Secondary | ICD-10-CM | POA: Diagnosis not present

## 2021-08-10 DIAGNOSIS — I420 Dilated cardiomyopathy: Secondary | ICD-10-CM

## 2021-08-10 DIAGNOSIS — K7581 Nonalcoholic steatohepatitis (NASH): Secondary | ICD-10-CM

## 2021-08-10 DIAGNOSIS — R160 Hepatomegaly, not elsewhere classified: Secondary | ICD-10-CM | POA: Diagnosis not present

## 2021-08-10 NOTE — Patient Instructions (Signed)
A few things to remember from today's visit:  Liver mass  NASH (nonalcoholic steatohepatitis)  Hyperlipidemia, unspecified hyperlipidemia type  Tinnitus, right ear  If you need refills please call your pharmacy. Do not use My Chart to request refills or for acute issues that need immediate attention.   Keep appt for liver MRI. We will continue following liver test, it has been normal for the past 2 years.  Please be sure medication list is accurate. If a new problem present, please set up appointment sooner than planned today. Tinnitus Tinnitus refers to hearing a sound when there is no actual source for that sound. This is often described as ringing in the ears. However, people with this condition may hear a variety of noises, in one ear or in both ears. The sounds of tinnitus can be soft, loud, or somewhere in between. Tinnitus can last for a few seconds or can be constant for days. It may go away without treatment and come back at various times. When tinnitus is constant or happens often, it can lead to other problems, such as trouble sleeping and trouble concentrating. Almost everyone experiences tinnitus at some point. Tinnitus is not the same as hearing loss. Tinnitus that is long-lasting (chronic) or comes back often (recurs) may require medical attention. What are the causes? The cause of tinnitus is often not known. In some cases, it can result from: Exposure to loud noises from machinery, music, or other sources. An object (foreign body) stuck in the ear. Earwax buildup. Drinking alcohol or caffeine. Taking certain medicines. Age-related hearing loss. It may also be caused by medical conditions such as: Ear or sinus infections. Heart diseases or high blood pressure. Allergies. Mnire's disease. Thyroid problems. Tumors. A weak, bulging blood vessel (aneurysm) near the ear. What increases the risk? The following factors may make you more likely to develop this  condition: Exposure to loud noises. Age. Tinnitus is more likely in older individuals. Using alcohol or tobacco. What are the signs or symptoms? The main symptom of tinnitus is hearing a sound when there is no source for that sound. It may sound like: Buzzing. Sizzling. Ringing. Blowing air. Hissing. Whistling. Other sounds may include: Roaring. Running water. A musical note. Tapping. Humming. Symptoms may affect only one ear (unilateral) or both ears (bilateral). How is this diagnosed? Tinnitus is diagnosed based on your symptoms, your medical history, and a physical exam. Your health care provider may do a thorough hearing test (audiologic exam) if your tinnitus: Is unilateral. Causes hearing difficulties. Lasts 6 months or longer. You may work with a health care provider who specializes in hearing disorders (audiologist). You may be asked questions about your symptoms and how they affect your daily life. You may have other tests done, such as: CT scan. MRI. An imaging test of how blood flows through your blood vessels (angiogram). How is this treated? Treating an underlying medical condition can sometimes make tinnitus go away. If your tinnitus continues, other treatments may include: Therapy and counseling to help you manage the stress of living with tinnitus. Sound generators to mask the tinnitus. These include: Tabletop sound machines that play relaxing sounds to help you fall asleep. Wearable devices that fit in your ear and play sounds or music. Acoustic neural stimulation. This involves using headphones to listen to music that contains an auditory signal. Over time, listening to this signal may change some pathways in your brain and make you less sensitive to tinnitus. This treatment is used for very  severe cases when no other treatment is working. Using hearing aids or cochlear implants if your tinnitus is related to hearing loss. Hearing aids are worn in the outer ear.  Cochlear implants are surgically placed in the inner ear. Follow these instructions at home: Managing symptoms     When possible, avoid being in loud places and being exposed to loud sounds. Wear hearing protection, such as earplugs, when you are exposed to loud noises. Use a white noise machine, a humidifier, or other devices to mask the sound of tinnitus. Practice techniques for reducing stress, such as meditation, yoga, or deep breathing. Work with your health care provider if you need help with managing stress. Sleep with your head slightly raised. This may reduce the impact of tinnitus. General instructions Do not use stimulants, such as nicotine, alcohol, or caffeine. Talk with your health care provider about other stimulants to avoid. Stimulants are substances that can make you feel alert and attentive by increasing certain activities in the body (such as heart rate and blood pressure). These substances may make tinnitus worse. Take over-the-counter and prescription medicines only as told by your health care provider. Try to get plenty of sleep each night. Keep all follow-up visits. This is important. Contact a health care provider if: Your tinnitus continues for 3 weeks or longer without stopping. You develop sudden hearing loss. Your symptoms get worse or do not get better with home care. You feel you are not able to manage the stress of living with tinnitus. Get help right away if: You develop tinnitus after a head injury. You have tinnitus along with any of the following: Dizziness. Nausea and vomiting. Loss of balance. Sudden, severe headache. Vision changes. Facial weakness or weakness of arms or legs. These symptoms may represent a serious problem that is an emergency. Do not wait to see if the symptoms will go away. Get medical help right away. Call your local emergency services (911 in the [place] Do not drive yourself to the hospital. Summary Tinnitus refers to hearing  a sound when there is no actual source for that sound. This is often described as ringing in the ears. Symptoms may affect only one ear (unilateral) or both ears (bilateral). Use a white noise machine, a humidifier, or other devices to mask the sound of tinnitus. Do not use stimulants, such as nicotine, alcohol, or caffeine. These substances may make tinnitus worse. This information is not intended to replace advice given to you by your health care provider. Make sure you discuss any questions you have with your health care provider. Document Revised: 02/02/2020 Document Reviewed: 02/02/2020 Elsevier Patient Education  [place].

## 2021-08-15 ENCOUNTER — Ambulatory Visit
Admission: RE | Admit: 2021-08-15 | Discharge: 2021-08-15 | Disposition: A | Discharge status: Home / Self Care | Payer: PPO | Source: Ambulatory Visit | Attending: Family Medicine | Admitting: Family Medicine

## 2021-08-15 DIAGNOSIS — K769 Liver disease, unspecified: Secondary | ICD-10-CM

## 2021-08-15 DIAGNOSIS — D1803 Hemangioma of intra-abdominal structures: Secondary | ICD-10-CM | POA: Diagnosis not present

## 2021-08-15 DIAGNOSIS — K7689 Other specified diseases of liver: Secondary | ICD-10-CM | POA: Diagnosis not present

## 2021-08-15 DIAGNOSIS — R160 Hepatomegaly, not elsewhere classified: Secondary | ICD-10-CM

## 2021-08-15 MED ORDER — GADOBENATE DIMEGLUMINE 529 MG/ML IV SOLN
15.0000 mL | Freq: Once | INTRAVENOUS | Status: AC | PRN
  Administered 2021-08-15: 15 mL via INTRAVENOUS

## 2021-08-18 ENCOUNTER — Telehealth: Payer: Self-pay | Admitting: Pharmacist

## 2021-08-18 NOTE — Progress Notes (Signed)
Chronic Care Management Pharmacy Note  08/19/2021 Name:  Latoya Thompson MRN:  [id number] DOB:  1960-10-24  Summary: LDL not at goal < 100 BP at goal < 130/80 per home and office readings   Recommendations/Changes made from today's visit: -Recommend switching to high intensity statin with LDL > 100 -Scheduled DEXA   Plan: BP assessment in 6 months Follow up in 1 year   Subjective: Latoya Thompson is an 61 y.o. year old female who is a primary patient of Martinique, Malka So, MD.  The CCM team was consulted for assistance with disease management and care coordination needs.    Engaged with patient by telephone for follow up visit in response to provider referral for pharmacy case management and/or care coordination services.   Consent to Services:  The patient was given information about Chronic Care Management services, agreed to services, and gave verbal consent prior to initiation of services.  Please see initial visit note for detailed documentation.   Patient Care Team: Martinique, Betty G, MD as PCP - General (Family Medicine) Viona Gilmore, [facility] as Pharmacist (Pharmacist)  Recent office visits: 08/10/21 Betty Martinique, MD: Patient presented for tinnitus.  07/20/21 Betty Martinique, MD: Patient presented for chronic conditions follow up. Follow up in 6 months for CPE. LDL stable but still > 100. Recommend resuming Protonix 20 mg daily.  06/15/21 Rolene Arbour, LPN: Patient presented for AWV.  Recent consult visits: 12/16/20 Vania Rea (OBGYN): Unable to access notes.  07/01/20 Lance Sell, OD (ophthalomology): Patient presented for keratoconus of both eyes and annual eye exam.  05/10/20 Jenkins Rouge, MD (cardiology): Patient presented for cardiomyopathy follow up. No medication changes made.  Hospital visits: None in previous 6 months  Objective:  Lab Results  Component Value Date   CREATININE 1.09 07/20/2021   BUN 15 07/20/2021   GFR 54.97 (L) 07/20/2021   GFRNONAA 60  11/26/2019   GFRAA 70 11/26/2019   NA 142 07/20/2021   K 3.5 07/20/2021   CALCIUM 9.4 07/20/2021   CO2 28 07/20/2021   GLUCOSE 87 07/20/2021    Lab Results  Component Value Date/Time   HGBA1C 5.9 07/20/2021 08:09 AM   HGBA1C 5.9 01/12/2021 08:37 AM   GFR 54.97 (L) 07/20/2021 08:09 AM   GFR 61.18 01/12/2021 08:37 AM   MICROALBUR 0.9 07/12/2020 08:40 AM    Last diabetic Eye exam: No results found for: "HMDIABEYEEXA"  Last diabetic Foot exam: No results found for: "HMDIABFOOTEX"   Lab Results  Component Value Date   CHOL 193 07/20/2021   HDL 52.30 07/20/2021   LDLCALC 127 (H) 07/20/2021   TRIG 69.0 07/20/2021   CHOLHDL 4 07/20/2021       Latest Ref Rng & Units 07/20/2021    8:09 AM 01/12/2021    8:37 AM 07/12/2020    8:40 AM  Hepatic Function  Total Protein 6.0 - 8.3 g/dL 7.5  7.6  7.4   Albumin 3.5 - 5.2 g/dL 4.3  4.3  4.4   AST 0 - 37 U/L 25  23  21    ALT 0 - 35 U/L 16  17  17    Alk Phosphatase 39 - 117 U/L 100  110  121   Total Bilirubin 0.2 - 1.2 mg/dL 0.7  0.6  0.6   Bilirubin, Direct 0.0 - 0.3 mg/dL  0.0  0.1     Lab Results  Component Value Date/Time   TSH 1.64 07/12/2020 08:40 AM   TSH 0.76 01/04/2018 07:40 AM  Latest Ref Rng & Units 07/13/2016    5:55 PM 09/02/2015   10:01 PM  CBC  WBC 4.0 - 10.5 K/uL 7.1  5.9   Hemoglobin 12.0 - 15.0 g/dL 15.5  14.4   Hematocrit 36.0 - 46.0 % 45.8  42.2   Platelets 150 - 400 K/uL 195  168     No results found for: "VD25OH"  Clinical ASCVD: No  The 10-year ASCVD risk score (Arnett DK, et al., 2019) is: 6.9%   Values used to calculate the score:     Age: 85 years     Sex: Female     Is Non-Hispanic African American: Yes     Diabetic: No     Tobacco smoker: No     Systolic Blood Pressure: 193 mmHg     Is BP treated: Yes     HDL Cholesterol: 52.3 mg/dL     Total Cholesterol: 193 mg/dL       07/20/2021    8:13 AM 06/15/2021   10:32 AM 01/12/2021    7:42 AM  Depression screen PHQ 2/9  Decreased Interest 0 0  0  Down, Depressed, Hopeless 0 0 0  PHQ - 2 Score 0 0 0      Social History   Tobacco Use  Smoking Status Never  Smokeless Tobacco Never   BP Readings from Last 3 Encounters:  08/10/21 126/80  07/20/21 130/80  01/12/21 134/80   Pulse Readings from Last 3 Encounters:  08/10/21 82  07/20/21 87  01/12/21 87   Wt Readings from Last 3 Encounters:  08/10/21 167 lb 2 oz (75.8 kg)  07/20/21 166 lb 2 oz (75.4 kg)  06/15/21 169 lb (76.7 kg)   BMI Readings from Last 3 Encounters:  08/10/21 29.60 kg/m  07/20/21 29.43 kg/m  06/15/21 29.94 kg/m    Assessment/Interventions: Review of patient past medical history, allergies, medications, health status, including review of consultants reports, laboratory and other test data, was performed as part of comprehensive evaluation and provision of chronic care management services.   SDOH:  (Social Determinants of Health) assessments and interventions performed: No  SDOH Screenings   Alcohol Screen: Low Risk  (07/16/2021)   Alcohol Screen    Last Alcohol Screening Score (AUDIT): 2  Depression (PHQ2-9): Low Risk  (07/20/2021)   Depression (PHQ2-9)    PHQ-2 Score: 0  Financial Resource Strain: Low Risk  (07/16/2021)   Overall Financial Resource Strain (CARDIA)    Difficulty of Paying Living Expenses: Not hard at all  Food Insecurity: No Food Insecurity (07/16/2021)   Hunger Vital Sign    Worried About Running Out of Food in the Last Year: Never true    [place] in the Last Year: Never true  Housing: Low Risk  (07/16/2021)   Housing    Last Housing Risk Score: 0  Physical Activity: Sufficiently Active (07/16/2021)   Exercise Vital Sign    Days of Exercise per Week: 4 days    Minutes of Exercise per Session: 60 min  Social Connections: Socially Integrated (07/16/2021)   Social Connection and Isolation Panel [NHANES]    Frequency of Communication with Friends and Family: More than three times a week    Frequency of Social Gatherings with  Friends and Family: More than three times a week    Attends Religious Services: More than 4 times per year    Active Member of [employer] or Organizations: Yes    Attends Archivist Meetings: More than 4  times per year    Marital Status: Married  Stress: No Stress Concern Present (07/16/2021)   [place]    Feeling of Stress : Not at all  Tobacco Use: Low Risk  (08/10/2021)   Patient History    Smoking Tobacco Use: Never    Smokeless Tobacco Use: Never    Passive Exposure: Not on file  Transportation Needs: No Transportation Needs (07/16/2021)   PRAPARE - Transportation    Lack of Transportation (Medical): No    Lack of Transportation (Non-Medical): No    CCM Care Plan  Allergies  Allergen Reactions   Codeine Itching    Medications Reviewed Today     Reviewed by Viona Gilmore, [facility] (Pharmacist) on 08/19/21 at [place] List Status: <None>   Medication Order Taking? Sig Documenting Provider Last Dose Status Informant  aspirin EC 81 MG tablet [id number]  Take 81 mg by mouth daily. [provider]  Active   enalapril (VASOTEC) 20 MG tablet [id number]  Take 1 tablet by mouth once daily Martinique, Betty G, MD  Active   lovastatin (MEVACOR) 40 MG tablet [id number]  TAKE 1 TABLET BY MOUTH AT BEDTIME Martinique, Betty G, MD  Active   pantoprazole (PROTONIX) 20 MG tablet [id number]  Take 1 tablet by mouth once daily Martinique, Betty G, MD  Active   sodium fluoride (FLUORISHIELD) 1.1 % GEL dental gel [id number]  SF 5000 Plus 1.1 % dental cream  BRUSH THOROUGHLY ONCE DAILY AT BEDTIME [provider]  Active   verapamil (CALAN) 120 MG tablet [id number]  Take 1 tablet by mouth once daily Martinique, Betty G, MD  Active   vitamin C (ASCORBIC ACID) 500 MG tablet [id number]  Take 500 mg by mouth daily. [provider]  Active Self  vitamin E 400 UNIT capsule [id number]  Take 400 Units by mouth daily. 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   LBBB (left bundle branch block) 07/04/2013   Chest pain 07/04/2013   Hypertension    Hyperlipidemia, unspecified     Immunization History  Administered Date(s) Administered   Hep A / Hep B 11/29/2016   Influenza, Seasonal, Injecte, Preservative Fre 12/23/2014   Influenza,inj,Quad PF,6+ Mos 11/29/2016, 01/02/2018, 11/26/2018, 11/26/2019, 01/12/2021   Influenza-Unspecified 12/23/2014   PFIZER(Purple Top)SARS-COV-2 Vaccination 05/22/2019, 06/16/2019   PPD Test 11/29/2016   Pfizer Covid-19 Vaccine Bivalent Booster 83yr & up 12/17/2020   Tdap 02/09/2012   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   Patient - 1 year  -  Conditions to be addressed/monitored:  Hypertension, Hyperlipidemia, GERD, Allergic Rhinitis, and Insomnia  Conditions addressed this visit: Hypertension, allergic rhinitis  Care Plan : CCM Pharmacy Care Plan  Updates made by PViona Gilmore RThurstonsince 08/19/2021 12:00 AM     Problem: Problem: Hypertension, Hyperlipidemia, GERD and Insomnia      Long-Range Goal: Patient-Specific Goal   Start Date: 06/29/2020  Expected End Date: 06/29/2021  Recent Progress: On track  Priority: High  Note:   Current Barriers:  Unable to achieve control of cholesterol   Pharmacist Clinical Goal(s):  Patient will achieve control of cholesterol as evidenced by next lipid panel  through collaboration with PharmD and provider.   Interventions: 1:1 collaboration  with Martinique, Betty G, MD regarding development and update of comprehensive plan of care as evidenced by provider attestation and co-signature Inter-disciplinary care team collaboration (see longitudinal plan of care) Comprehensive medication review performed; medication list updated in electronic medical record  Hypertension (BP goal <130/80) -Controlled -Current treatment: Enalapril 20 mg 1 tablet daily (in AM)  - Appropriate, Effective, Safe, Accessible Verapamil 120 mg 1 tablet daily (in AM) - Appropriate, Effective, Safe, Accessible -Medications previously tried: none  -Current home readings: 105/80 (sitting), 1280/80 (standing) (daily or every other day) -Current dietary habits: doesn't eat a lot of salt, reading package labels; doesn't eat a lot of frozen foods (except vegetables) -Current exercise habits: reports exercising every morning at 5am, walking/cardio/weights/Zumba; been going back to the [employer] 4 times a week (treadmill, lifting weights) 2-3 months ago -Denies hypotensive/hypertensive symptoms -Educated on Daily salt intake goal < 2300 mg; Exercise goal of 150 minutes per week; Importance of home blood pressure monitoring; Proper BP monitoring technique; -Counseled to monitor BP at home weekly, document, and provide log at future appointments -Counseled on diet and exercise extensively Recommended to continue current medication  Hyperlipidemia: (LDL goal < 100) -Uncontrolled -Current treatment: Lovastatin 40 mg 1 tablet at bedtime - Appropriate, Query effective, Safe, Accessible -Medications previously tried: none  -Current dietary patterns: reports eating red meats: once or twice a week. She and her spouse eat more poultry than red meats (she reports spouse had a transplant and they are being careful on what they eat); eats out once or twice a month; uses canola or olive oil -Current exercise habits: reports exercising every morning at 5am, walking/ cardio/ weights/  Zumba  -Educated on Cholesterol goals;  Benefits of statin for ASCVD risk reduction; Importance of limiting foods high in cholesterol; Exercise goal of 150 minutes per week; -Counseled on diet and exercise extensively Recommended to continue current medication Patient uses Alexa to help with reminders to take this in the evening.  Prevention of ASCVD (Goal: prevent heart attacks and strokes) -Controlled -Current treatment  Aspirin EC 81 mg tablet daily - Appropriate, Effective, Safe, Accessible -Medications previously tried: none  - Patient reports due to family history of DM, HTN, and stroke, her cardiologist put on this a couple of years back for prevention  GERD (Goal: minimize symptoms) -Controlled -Current treatment  Pantoprazole 20 mg 1 tablet daily (taking as needed) - Appropriate, Effective, Safe, Accessible -Medications previously tried: none  -Recommended taking medication on days she is eating food that will cause heartburn such as spaghetti and tomato sauce.  Insomnia (Goal: improve quality and quantity of sleep) -Controlled -Current treatment  Zolpidem 5 mg, 0.5 to 1 tablet at bedtime as needed for sleep (rarely takes it - once every 2 weeks or 3 weeks) - Appropriate, Effective, Safe, Accessible -Medications previously tried: none -Recommended to continue current medication Patient takes very sparingly sometimes cutting it into 1/4 tablets  Allergic rhinitis (Goal: minimize symptoms) -Controlled -Current treatment  None -Medications previously tried: montelukast (ineffective) -Recommended to continue current medication Patient is not sure this is helping and will trial a period without it to see if she notices a difference.  Health Maintenance -Vaccine gaps: shingles -Current therapy:  Vitamin E 400 units 1 capsule daily (for  NASH) Vitamin C 500 mg 1 tablet daily Sodium flouride 1.1% dental gel brush thoroughly at bedtime Vitamin D 1000 units every other  day -Educated on Cost vs benefit of each product must be carefully weighed by individual consumer -Patient is satisfied with current therapy and denies issues -Recommended to continue current medication  Patient Goals/Self-Care Activities Patient will:  - take medications as prescribed check blood pressure weekly, document, and provide at future appointments target a minimum of 150 minutes of moderate intensity exercise weekly Set a timer for lovastatin to be taken in the evening.  Follow Up Plan: Telephone follow up appointment with care management team member scheduled for:  1 year         Medication Assistance: None required.  Patient affirms current coverage meets needs.  Compliance/Adherence/Medication fill history: Care Gaps: Shingrix Last BP - 126/80 08/10/2021 Last A1C - 5.9 on 07/20/2021   Star-Rating Drugs: Enalapril 22m - last filled on 06/13/21 90DS at [facility] Lovastatin 40 mg - [facility] [address] [id number]: [phone]: [phone]  Uses pill box? Yes Pt endorses 100% compliance  We discussed: Current pharmacy is preferred with insurance plan and patient is satisfied with pharmacy services Patient decided to: Continue current medication management strategy  Care Plan and Follow Up Patient Decision:  Patient agrees to Care Plan and Follow-up.  Plan: Telephone follow up appointment with care management team member scheduled for:  1 year  MJeni Salles PharmD BMonturaPharmacist LOccidental [phone]

## 2021-08-18 NOTE — Chronic Care Management (AMB) (Signed)
    Chronic Care Management Pharmacy Assistant   Name: Latoya Thompson  MRN: [id number] DOB: 05-16-1960  08/19/2021 APPOINTMENT REMINDER  Latoya Thompson was reminded to have all medications, supplements and any blood glucose and blood pressure readings available for review with Latoya Thompson, Pharm. D, at her telephone visit on 08/19/2021 at 10:30.  Care Gaps: AWV - scheduled 06/26/2022 Last BP - 126/80 08/10/2021 Last A1C - 5.9 on 07/20/2021  Star Rating Drug: Lovastatin 40 mg - last filled 07/12/2021 90 DS at [facility]  Any gaps in medications fill history? No  Latoya Thompson [facility]  Catering manager [phone]

## 2021-08-19 ENCOUNTER — Ambulatory Visit (INDEPENDENT_AMBULATORY_CARE_PROVIDER_SITE_OTHER): Payer: PPO | Admitting: Pharmacist

## 2021-08-19 DIAGNOSIS — E785 Hyperlipidemia, unspecified: Secondary | ICD-10-CM

## 2021-08-19 DIAGNOSIS — I10 Essential (primary) hypertension: Secondary | ICD-10-CM

## 2021-08-19 NOTE — Patient Instructions (Signed)
Hi Latoya Thompson,  It was great to get to catch up with you again! Keep up the good work with taking care of yourself and checking your blood pressures regularly!  Please reach out to me if you have any questions or need anything before our follow up!  Best, Maddie  Jeni Salles, PharmD, [facility]   Visit Information   Goals Addressed   None    Patient Care Plan: CCM Pharmacy Care Plan     Problem Identified: Problem: Hypertension, Hyperlipidemia, GERD and Insomnia      Long-Range Goal: Patient-Specific Goal   Start Date: 06/29/2020  Expected End Date: 06/29/2021  Recent Progress: On track  Priority: High  Note:   Current Barriers:  Unable to achieve control of cholesterol   Pharmacist Clinical Goal(s):  Patient will achieve control of cholesterol as evidenced by next lipid panel  through collaboration with PharmD and provider.   Interventions: 1:1 collaboration with Martinique, Betty G, MD regarding development and update of comprehensive plan of care as evidenced by provider attestation and co-signature Inter-disciplinary care team collaboration (see longitudinal plan of care) Comprehensive medication review performed; medication list updated in electronic medical record  Hypertension (BP goal <130/80) -Controlled -Current treatment: Enalapril 20 mg 1 tablet daily (in AM)  - Appropriate, Effective, Safe, Accessible Verapamil 120 mg 1 tablet daily (in AM) - Appropriate, Effective, Safe, Accessible -Medications previously tried: none  -Current home readings: 105/80 (sitting), 1280/80 (standing) (daily or every other day) -Current dietary habits: doesn't eat a lot of salt, reading package labels; doesn't eat a lot of frozen foods (except vegetables) -Current exercise habits: reports exercising every morning at 5am, walking/cardio/weights/Zumba; been going back to the [employer] 4 times a week (treadmill, lifting  weights) 2-3 months ago -Denies hypotensive/hypertensive symptoms -Educated on Daily salt intake goal < 2300 mg; Exercise goal of 150 minutes per week; Importance of home blood pressure monitoring; Proper BP monitoring technique; -Counseled to monitor BP at home weekly, document, and provide log at future appointments -Counseled on diet and exercise extensively Recommended to continue current medication  Hyperlipidemia: (LDL goal < 100) -Uncontrolled -Current treatment: Lovastatin 40 mg 1 tablet at bedtime - Appropriate, Query effective, Safe, Accessible -Medications previously tried: none  -Current dietary patterns: reports eating red meats: once or twice a week. She and her spouse eat more poultry than red meats (she reports spouse had a transplant and they are being careful on what they eat); eats out once or twice a month; uses canola or olive oil -Current exercise habits: reports exercising every morning at 5am, walking/ cardio/ weights/ Zumba  -Educated on Cholesterol goals;  Benefits of statin for ASCVD risk reduction; Importance of limiting foods high in cholesterol; Exercise goal of 150 minutes per week; -Counseled on diet and exercise extensively Recommended to continue current medication Patient uses Alexa to help with reminders to take this in the evening.  Prevention of ASCVD (Goal: prevent heart attacks and strokes) -Controlled -Current treatment  Aspirin EC 81 mg tablet daily - Appropriate, Effective, Safe, Accessible -Medications previously tried: none  - Patient reports due to family history of DM, HTN, and stroke, her cardiologist put on this a couple of years back for prevention  GERD (Goal: minimize symptoms) -Controlled -Current treatment  Pantoprazole 20 mg 1 tablet daily (taking as needed) - Appropriate, Effective, Safe, Accessible -Medications previously tried: none  -Recommended taking medication on days she is eating food that will cause heartburn such  as  spaghetti and tomato sauce.  Insomnia (Goal: improve quality and quantity of sleep) -Controlled -Current treatment  Zolpidem 5 mg, 0.5 to 1 tablet at bedtime as needed for sleep (rarely takes it - once every 2 weeks or 3 weeks) - Appropriate, Effective, Safe, Accessible -Medications previously tried: none -Recommended to continue current medication Patient takes very sparingly sometimes cutting it into 1/4 tablets  Allergic rhinitis (Goal: minimize symptoms) -Controlled -Current treatment  None -Medications previously tried: montelukast (ineffective) -Recommended to continue current medication Patient is not sure this is helping and will trial a period without it to see if she notices a difference.  Health Maintenance -Vaccine gaps: shingles -Current therapy:  Vitamin E 400 units 1 capsule daily (for NASH) Vitamin C 500 mg 1 tablet daily Sodium flouride 1.1% dental gel brush thoroughly at bedtime Vitamin D 1000 units every other day -Educated on Cost vs benefit of each product must be carefully weighed by individual consumer -Patient is satisfied with current therapy and denies issues -Recommended to continue current medication  Patient Goals/Self-Care Activities Patient will:  - take medications as prescribed check blood pressure weekly, document, and provide at future appointments target a minimum of 150 minutes of moderate intensity exercise weekly Set a timer for lovastatin to be taken in the evening.  Follow Up Plan: Telephone follow up appointment with care management team member scheduled for:  1 year       Patient verbalizes understanding of instructions and care plan provided today and agrees to view in [facility]. Active MyChart status and patient understanding of how to access instructions and care plan via MyChart confirmed with patient.    Telephone follow up appointment with pharmacy team member scheduled for: 1 year  Viona Gilmore, [facility]

## 2021-08-20 ENCOUNTER — Ambulatory Visit
Admission: EM | Admit: 2021-08-20 | Discharge: 2021-08-20 | Disposition: A | Discharge status: Home / Self Care | Payer: PPO | Attending: Urgent Care | Admitting: Urgent Care

## 2021-08-20 DIAGNOSIS — N3001 Acute cystitis with hematuria: Secondary | ICD-10-CM | POA: Insufficient documentation

## 2021-08-20 LAB — POCT URINALYSIS DIP (MANUAL ENTRY)
Bilirubin, UA: NEGATIVE
Glucose, UA: NEGATIVE mg/dL
Ketones, POC UA: NEGATIVE mg/dL
Nitrite, UA: NEGATIVE
Protein Ur, POC: NEGATIVE mg/dL
Spec Grav, UA: >=1.03 — AB (ref 1.010–1.025)
Urobilinogen, UA: 0.2 E.U./dL
pH, UA: 5.5 (ref 5.0–8.0)

## 2021-08-20 MED ORDER — CEPHALEXIN 500 MG PO CAPS
500.0000 mg | ORAL_CAPSULE | Freq: Two times a day (BID) | ORAL | 0 refills | Status: DC
Start: 2021-08-20 — End: 2021-12-12

## 2021-08-20 NOTE — ED Triage Notes (Signed)
Pt c/o urinary urgency, lower abd pain.  Started: today   Home interventions: hydration

## 2021-08-20 NOTE — Discharge Instructions (Addendum)
Start Keflex to address your UTI with an antibiotic. Make sure you hydrate very well with plain water and a quantity of 64 ounces of water a day.  Please limit drinks that are considered urinary irritants such as soda, sweet tea, coffee, energy drinks, alcohol.  These can worsen your urinary and genital symptoms but also be the source of them.  I will let you know about your urine culture results through MyChart to see if we need to prescribe or change your antibiotics based off of those results.

## 2021-08-20 NOTE — ED Provider Notes (Signed)
[facility]   MRN: [id number] DOB: 1960-06-19  Subjective:   Latoya Thompson is a 61 y.o. female presenting for 1 day history of acute onset urinary frequency, urinary urgency and irritation.  Has had some lower/pelvic abdominal pain.  Tries to hydrate well, has half cup of coffee.  Drinks an occasional soda and occasional sweet tea.  No fever, nausea, vomiting, vaginal discharge, hematuria, history of renal stones.  Has a remote history of an urinary tract infection.  No current facility-administered medications for this encounter.  Current Outpatient Medications:    aspirin EC 81 MG tablet, Take 81 mg by mouth daily., Disp: , Rfl:    Cholecalciferol (VITAMIN D3) 25 MCG (1000 UT) CAPS, Take 1 capsule by mouth 3 (three) times a week., Disp: , Rfl:    enalapril (VASOTEC) 20 MG tablet, Take 1 tablet by mouth once daily, Disp: 90 tablet, Rfl: 0   lovastatin (MEVACOR) 40 MG tablet, TAKE 1 TABLET BY MOUTH AT BEDTIME, Disp: 90 tablet, Rfl: 2   pantoprazole (PROTONIX) 20 MG tablet, Take 1 tablet by mouth once daily, Disp: 90 tablet, Rfl: 3   sodium fluoride (FLUORISHIELD) 1.1 % GEL dental gel, SF 5000 Plus 1.1 % dental cream  BRUSH THOROUGHLY ONCE DAILY AT BEDTIME, Disp: , Rfl:    verapamil (CALAN) 120 MG tablet, Take 1 tablet by mouth once daily, Disp: 90 tablet, Rfl: 0   vitamin C (ASCORBIC ACID) 500 MG tablet, Take 500 mg by mouth daily., Disp: , Rfl:    vitamin E 400 UNIT capsule, Take 400 Units by mouth daily., Disp: , Rfl:    Allergies  Allergen Reactions   Codeine Itching    Past Medical History:  Diagnosis Date   Chicken pox    Hypercholesteremia    Hypertension    Keratoconus    Left     Past Surgical History:  Procedure Laterality Date   BREAST SURGERY     biopsy   CORNEAL TRANSPLANT      Family History  Problem Relation Age of Onset   Hyperlipidemia Mother    Heart disease Mother    Stroke Mother    Hypertension Mother    Diabetes Mother     Hyperlipidemia Father    Heart disease Father    Stroke Father    Hypertension Father    Diabetes Father     Social History   Tobacco Use   Smoking status: Never   Smokeless tobacco: Never  Substance Use Topics   Alcohol use: No   Drug use: No    ROS   Objective:   Vitals: BP 137/89 (BP Location: Right Arm)   Pulse 76   Temp 98 F (36.7 C) (Oral)   Resp 18   SpO2 95%   Physical Exam Constitutional:      General: She is not in acute distress.    Appearance: Normal appearance. She is well-developed. She is not ill-appearing, toxic-appearing or diaphoretic.  HENT:     Head: Normocephalic and atraumatic.     Nose: Nose normal.     Mouth/Throat:     Mouth: Mucous membranes are moist.  Eyes:     General: No scleral icterus.       Right eye: No discharge.        Left eye: No discharge.     Extraocular Movements: Extraocular movements intact.     Conjunctiva/sclera: Conjunctivae normal.  Cardiovascular:     Rate and Rhythm: Normal rate.  Pulmonary:     Effort: Pulmonary effort is normal.  Abdominal:     General: Bowel sounds are normal. There is no distension.     Palpations: Abdomen is soft. There is no mass.     Tenderness: There is no abdominal tenderness. There is no right CVA tenderness, left CVA tenderness, guarding or rebound.  Skin:    General: Skin is warm and dry.  Neurological:     General: No focal deficit present.     Mental Status: She is alert and oriented to person, place, and time.  Psychiatric:        Mood and Affect: Mood normal.        Behavior: Behavior normal.        Thought Content: Thought content normal.        Judgment: Judgment normal.     Results for orders placed or performed during the hospital encounter of 08/20/21 (from the past 24 hour(s))  POCT urinalysis dipstick     Status: Abnormal   Collection Time: 08/20/21 10:35 AM  Result Value Ref Range   Color, UA yellow yellow   Clarity, UA cloudy (A) clear   Glucose, UA  negative negative mg/dL   Bilirubin, UA negative negative   Ketones, POC UA negative negative mg/dL   Spec Grav, UA >=1.030 (A) 1.010 - 1.025   Blood, UA large (A) negative   pH, UA 5.5 5.0 - 8.0   Protein Ur, POC negative negative mg/dL   Urobilinogen, UA 0.2 0.2 or 1.0 E.U./dL   Nitrite, UA Negative Negative   Leukocytes, UA Small (1+) (A) Negative    Assessment and Plan :   PDMP not reviewed this encounter.  1. Acute cystitis with hematuria    Start Keflex to cover for acute cystitis, urine culture pending.  Recommended aggressive hydration, limiting urinary irritants. Counseled patient on potential for adverse effects with medications prescribed/recommended today, ER and return-to-clinic precautions discussed, patient verbalized understanding.    Jaynee Eagles, PA-C 08/20/21 1047

## 2021-08-21 LAB — URINE CULTURE

## 2021-09-02 ENCOUNTER — Other Ambulatory Visit: Payer: Self-pay | Admitting: Family Medicine

## 2021-09-02 DIAGNOSIS — I10 Essential (primary) hypertension: Secondary | ICD-10-CM

## 2021-09-06 ENCOUNTER — Inpatient Hospital Stay: Admission: RE | Admit: 2021-09-06 | Payer: PPO | Source: Ambulatory Visit

## 2021-09-09 DIAGNOSIS — E785 Hyperlipidemia, unspecified: Secondary | ICD-10-CM

## 2021-09-09 DIAGNOSIS — I10 Essential (primary) hypertension: Secondary | ICD-10-CM

## 2021-12-12 ENCOUNTER — Ambulatory Visit
Admission: EM | Admit: 2021-12-12 | Discharge: 2021-12-12 | Disposition: A | Discharge status: Home / Self Care | Payer: PPO | Attending: Family Medicine | Admitting: Family Medicine

## 2021-12-12 ENCOUNTER — Ambulatory Visit (INDEPENDENT_AMBULATORY_CARE_PROVIDER_SITE_OTHER): Payer: PPO

## 2021-12-12 DIAGNOSIS — M25511 Pain in right shoulder: Secondary | ICD-10-CM

## 2021-12-12 MED ORDER — PREDNISONE 20 MG PO TABS: 40.0000 mg | ORAL_TABLET | Freq: Every day | ORAL | 0 refills | Status: AC

## 2021-12-12 NOTE — ED Provider Notes (Addendum)
[facility]    CSN: [id number] Arrival date & time: 12/12/21  6546      History   Chief Complaint Chief Complaint  Patient presents with   Arm Pain    HPI Latoya MULVANEY is a 61 y.o. female.    Arm Pain   Here for pain in her right shoulder and right deltoid area.  She has not noticed any trauma or fall.  She was doing some strength training and in August she remembers increasing her weight on the exercise that she was doing and feels that maybe that is when it started bothering her.  No fever or chills or cough associated with this  Last EGFR was mildly reduced in the 50s.  No rash or tingling or itching.  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      Home Medications    Prior to Admission medications   Medication Sig Start Date End Date Taking? Authorizing Provider  predniSONE (DELTASONE) 20 MG tablet Take 2 tablets (40 mg total) by mouth daily with breakfast for 3 days. 12/12/21 12/15/21 Yes Barrett Henle, MD  aspirin EC 81 MG tablet Take 81 mg by mouth daily.    [provider]  Cholecalciferol (VITAMIN D3) 25 MCG (1000 UT) CAPS  Take 1 capsule by mouth 3 (three) times a week.    [provider]  enalapril (VASOTEC) 20 MG tablet Take 1 tablet by mouth once daily 09/02/21   Martinique, Betty G, MD  lovastatin (MEVACOR) 40 MG tablet TAKE 1 TABLET BY MOUTH AT BEDTIME 02/23/21   [place], Betty G, MD  pantoprazole (PROTONIX) 20 MG tablet Take 1 tablet by mouth once daily 02/22/21   [place], Betty G, MD  sodium fluoride (FLUORISHIELD) 1.1 % GEL dental gel SF 5000 Plus 1.1 % dental cream  BRUSH THOROUGHLY ONCE DAILY AT BEDTIME    [provider]  verapamil (CALAN) 120 MG tablet Take 1 tablet by mouth once daily 09/02/21   [place], Betty G, MD  vitamin C (ASCORBIC ACID) 500 MG tablet Take 500 mg by mouth daily.    [provider]  vitamin E 400 UNIT capsule Take 400 Units by mouth daily.    [provider]    Family History Family History  Problem Relation Age of Onset   Hyperlipidemia Mother    Heart disease Mother    Stroke Mother    Hypertension Mother    Diabetes Mother    Hyperlipidemia Father    Heart disease Father    Stroke Father    Hypertension Father    Diabetes Father  Social History Social History   Tobacco Use   Smoking status: Never   Smokeless tobacco: Never  Substance Use Topics   Alcohol use: No   Drug use: No     Allergies   Codeine   Review of Systems Review of Systems   Physical Exam Triage Vital Signs ED Triage Vitals  Enc Vitals Group     BP 12/12/21 1014 125/83     Pulse Rate 12/12/21 1014 71     Resp 12/12/21 1014 16     Temp 12/12/21 1014 97.9 F (36.6 C)     Temp Source 12/12/21 1014 Oral     SpO2 12/12/21 1014 95 %     Weight --      Height --      Head Circumference --      Peak Flow --      Pain Score 12/12/21 1033 7     Pain Loc --      Pain Edu? --      Excl. in [place]? --    No data found.  Updated Vital Signs BP 125/83 (BP Location: Right Arm)   Pulse 71   Temp 97.9 F (36.6 C) (Oral)   Resp 16   SpO2 95%   Visual  Acuity Right Eye Distance:   Left Eye Distance:   Bilateral Distance:    Right Eye Near:   Left Eye Near:    Bilateral Near:     Physical Exam Vitals reviewed.  Constitutional:      General: She is not in acute distress.    Appearance: She is not ill-appearing, toxic-appearing or diaphoretic.  Cardiovascular:     Rate and Rhythm: Normal rate and regular rhythm.  Pulmonary:     Effort: Pulmonary effort is normal.     Breath sounds: Normal breath sounds.  Musculoskeletal:     Comments: There is some tenderness of the deltoid and there is some pain on passive and active range of motion of the right shoulder.  Neurological:     General: No focal deficit present.     Mental Status: She is alert and oriented to person, place, and time.  Psychiatric:        Behavior: Behavior normal.      UC Treatments / Results  Labs (all labs ordered are listed, but only abnormal results are displayed) Labs Reviewed - No data to display  EKG   Radiology DG Shoulder Right  Result Date: 12/12/2021 CLINICAL DATA:  Right shoulder pain and pain and deltoid for 1 month. Began after patient increased weight she was lifting. Exercising more. EXAM: RIGHT SHOULDER - 2+ VIEW COMPARISON:  None Available. FINDINGS: Minimal acromioclavicular joint space narrowing and inferior degenerative osteophytosis. The glenohumeral joint space is maintained. No acute fracture is seen. No dislocation. Mild calcification within the aortic arch. IMPRESSION: Minimal acromioclavicular osteoarthritis. Electronically Signed   By: Yvonne Kendall M.D.   On: 12/12/2021 11:05    Procedures Procedures (including critical care time)  Medications Ordered in UC Medications - No data to display  Initial Impression / Assessment and Plan / UC Course  I have reviewed the triage vital signs and the nursing notes.  Pertinent labs & imaging results that were available during my care of the patient were reviewed by me and considered in  my medical decision making (see chart for details).        She wanted to go ahead and leave before x-ray results were and, so she is  discharged and we will call her if there is anything significant on her x-ray.  3 days of prednisone are sent in.  And she is given exercises for range of motion about the right shoulder  Asked to follow-up with her primary if this continues so that she can possibly be evaluated further and consider physical therapy  I let her know by phone when the x-ray results with and it did show a little arthritis. Final Clinical Impressions(s) / UC Diagnoses   Final diagnoses:  Acute pain of right shoulder     Discharge Instructions      We will call you if there is any significant abnormality on the x-ray.  Take prednisone 20 mg--2 daily for 3 days.  This is to hopefully help with the inflammation in the muscles around your shoulder.  Also take Tylenol as needed.      ED Prescriptions     Medication Sig Dispense Auth. Provider   predniSONE (DELTASONE) 20 MG tablet Take 2 tablets (40 mg total) by mouth daily with breakfast for 3 days. 6 tablet Windy Carina Gwenlyn Perking, MD      PDMP not reviewed this encounter.   Barrett Henle, MD 12/12/21 1103    Barrett Henle, MD 12/12/21 1115

## 2021-12-12 NOTE — Discharge Instructions (Addendum)
We will call you if there is any significant abnormality on the x-ray.  Take prednisone 20 mg--2 daily for 3 days.  This is to hopefully help with the inflammation in the muscles around your shoulder.  Also take Tylenol as needed.

## 2021-12-12 NOTE — ED Triage Notes (Signed)
Pt c/o RUE pain (states she has been exercising more).   Started: a month ago   Home interventions: none

## 2021-12-23 ENCOUNTER — Encounter: Payer: Self-pay | Admitting: Family Medicine

## 2021-12-24 ENCOUNTER — Encounter: Payer: Self-pay | Admitting: Family Medicine

## 2021-12-30 ENCOUNTER — Other Ambulatory Visit (INDEPENDENT_AMBULATORY_CARE_PROVIDER_SITE_OTHER): Payer: PPO | Admitting: Family Medicine

## 2021-12-30 DIAGNOSIS — R251 Tremor, unspecified: Secondary | ICD-10-CM

## 2021-12-30 MED ORDER — PRIMIDONE 50 MG PO TABS: 25.0000 mg | ORAL_TABLET | Freq: Every day | ORAL | 1 refills | Status: DC

## 2021-12-30 NOTE — Progress Notes (Signed)
Please see the MyChart message reply(ies) for my assessment and plan.  The patient gave consent for this Medical Advice Message and is aware that it may result in a bill to their insurance company as well as the possibility that this may result in a co-payment or deductible. They are an established patient, but are not seeking medical advice exclusively about a problem treated during an in person or video visit in the last 7 days. I did not recommend an in person or video visit within 7 days of my reply. 12/14/21 4 min and 12/30/21 8 min (reviewing possible interactions and side effects of medication) 1. Tremor, unspecified - primidone (MYSOLINE) 50 MG tablet; Take 0.5 tablets (25 mg total) by mouth at bedtime.  Dispense: 30 tablet; Refill: 1  I spent a total of 12 minutes cumulative time within 7 days through MyChart messaging Daxx Tiggs [place], MD

## 2022-01-20 ENCOUNTER — Encounter: Payer: PPO | Admitting: Family Medicine

## 2022-01-25 DIAGNOSIS — Z1231 Encounter for screening mammogram for malignant neoplasm of breast: Secondary | ICD-10-CM | POA: Diagnosis not present

## 2022-01-27 LAB — HM MAMMOGRAPHY

## 2022-01-31 ENCOUNTER — Encounter: Payer: Self-pay | Admitting: Family Medicine

## 2022-02-10 NOTE — Progress Notes (Signed)
HPI: Latoya Thompson is a 61 y.o. female with PMHx significant for prediabetes,HLD,HTN, and NASH here today for her routine physical.  Last CPE: 01/12/21 She reports exercising regularly, engaging in cardio activities three to four days a week, and maintaining a healthy diet, primarily consuming chicken and fish, with red meat intake limited to once a week.  She sleeps for an average of 5 to 6 hours per night.   Immunization History  Administered Date(s) Administered   Hep A / Hep B 11/29/2016   Influenza Inj Mdck Quad Pf 01/15/2022   Influenza, Seasonal, Injecte, Preservative Fre 12/23/2014   Influenza,inj,Quad PF,6+ Mos 11/29/2016, 01/02/2018, 11/26/2018, 11/26/2019, 01/12/2021   Influenza-Unspecified 12/23/2014   PFIZER(Purple Top)SARS-COV-2 Vaccination 05/22/2019, 06/16/2019   PPD Test 11/29/2016   Pfizer Covid-19 Vaccine Bivalent Booster 67yrs & up 12/17/2020   Tdap 02/09/2012   Unspecified SARS-COV-2 Vaccination 01/15/2022   Health Maintenance  Topic Date Due   Zoster Vaccines- Shingrix (1 of 2) Never done   DTaP/Tdap/Td (2 - Td or Tdap) 02/08/2022   COVID-19 Vaccine (5 - 2023-24 season) 03/12/2022   Medicare Annual Wellness (AWV)  06/16/2022   PAP SMEAR-Modifier  12/17/2023   MAMMOGRAM  01/28/2024   COLONOSCOPY (Pts 45-65yrs Insurance coverage will need to be confirmed)  10/04/2025   INFLUENZA VACCINE  Completed   Hepatitis C Screening  Completed   HIV Screening  Completed   HPV VACCINES  Aged Out   Tremor, problem has been going on for a while, her sister has similar problem. She has not yet started taking Primidone for tremor and expresses concerns about the side effects. She experiences a slight tremor, mainly when typing on a computer.  She has a rash around the corners of the mouth, which has been present for approximately one month. The rash is described as dry and occasionally itchy. She has been using Vaseline as a home remedy.  She also mentions that she was  evaluated in the ED a few weeks ago because right shoulder pain and has been diagnosed with osteoarthritis following an x-ray. She is advised to take Tylenol for pain management. Pain has improved.  HLD on Lovastatin 40 mg daily. Last LDL 127. Prediabetes: Last HgA1C 5.9 in 07/2021. Negative for polydipsia,polyuria, or polyphagia.  HTN on Verapamil 120 mg daily and Enalapril 20 mg daily.  Review of Systems  Constitutional:  Negative for activity change, appetite change and fever.  HENT:  Negative for hearing loss, mouth sores, sore throat and trouble swallowing.   Eyes:  Negative for redness and visual disturbance.  Respiratory:  Negative for cough, shortness of breath and wheezing.   Cardiovascular:  Negative for chest pain and leg swelling.  Gastrointestinal:  Negative for abdominal pain, nausea and vomiting.       No changes in bowel habits.  Endocrine: Negative for cold intolerance, heat intolerance, polydipsia, polyphagia and polyuria.  Genitourinary:  Negative for decreased urine volume, dysuria, hematuria, vaginal bleeding and vaginal discharge.  Musculoskeletal:  Positive for arthralgias. Negative for gait problem.  Skin:  Negative for color change and rash.  Allergic/Immunologic: Negative for environmental allergies.  Neurological:  Positive for tremors. Negative for syncope, weakness and headaches.  Hematological:  Negative for adenopathy. Does not bruise/bleed easily.  Psychiatric/Behavioral:  Negative for confusion. The patient is not nervous/anxious.   All other systems reviewed and are negative.  Current Outpatient Medications on File Prior to Visit  Medication Sig Dispense Refill   aspirin EC 81 MG tablet Take 81  mg by mouth daily.     Cholecalciferol (VITAMIN D3) 25 MCG (1000 UT) CAPS Take 1 capsule by mouth 3 (three) times a week.     enalapril (VASOTEC) 20 MG tablet Take 1 tablet by mouth once daily 90 tablet 1   lovastatin (MEVACOR) 40 MG tablet TAKE 1 TABLET BY  MOUTH AT BEDTIME 90 tablet 2   pantoprazole (PROTONIX) 20 MG tablet Take 1 tablet by mouth once daily 90 tablet 3   primidone (MYSOLINE) 50 MG tablet Take 0.5 tablets (25 mg total) by mouth at bedtime. 30 tablet 1   sodium fluoride (FLUORISHIELD) 1.1 % GEL dental gel SF 5000 Plus 1.1 % dental cream  BRUSH THOROUGHLY ONCE DAILY AT BEDTIME     verapamil (CALAN) 120 MG tablet Take 1 tablet by mouth once daily 90 tablet 1   vitamin C (ASCORBIC ACID) 500 MG tablet Take 500 mg by mouth daily.     vitamin E 400 UNIT capsule Take 400 Units by mouth daily.     No current facility-administered medications on file prior to visit.   Past Medical History:  Diagnosis Date   Chicken pox    Hypercholesteremia    Hypertension    Keratoconus    Left   Past Surgical History:  Procedure Laterality Date   BREAST SURGERY     biopsy   CORNEAL TRANSPLANT     Allergies  Allergen Reactions   Codeine Itching   Family History  Problem Relation Age of Onset   Hyperlipidemia Mother    Heart disease Mother    Stroke Mother    Hypertension Mother    Diabetes Mother    Hyperlipidemia Father    Heart disease Father    Stroke Father    Hypertension Father    Diabetes Father    Social History   Socioeconomic History   Marital status: Married    Spouse name: Renae Fickle   Number of children: 0   Years of education: Not on file   Highest education level: Bachelor's degree (e.g., BA, AB, BS)  Occupational History   Occupation: Educator  Tobacco Use   Smoking status: Never   Smokeless tobacco: Never  Substance and Sexual Activity   Alcohol use: No   Drug use: No   Sexual activity: Not on file  Other Topics Concern   Not on file  Social History Narrative   Married, lives with husband Renae Fickle in a 2 story home. Drinks 1/2 cup of coffee a day, rare soda or tea. Exercise regularly for at least 60 minutes.   Social Determinants of Health   Financial Resource Strain: Low Risk  (07/16/2021)   Overall  Financial Resource Strain (CARDIA)    Difficulty of Paying Living Expenses: Not hard at all  Food Insecurity: No Food Insecurity (07/16/2021)   Hunger Vital Sign    Worried About Running Out of Food in the Last Year: Never true    Ran Out of Food in the Last Year: Never true  Transportation Needs: No Transportation Needs (07/16/2021)   PRAPARE - Administrator, Civil Service (Medical): No    Lack of Transportation (Non-Medical): No  Physical Activity: Sufficiently Active (07/16/2021)   Exercise Vital Sign    Days of Exercise per Week: 4 days    Minutes of Exercise per Session: 60 min  Stress: No Stress Concern Present (07/16/2021)   [employer] of Occupational Health - Occupational Stress Questionnaire    Feeling of Stress : Not at  all  Social Connections: Socially Integrated (07/16/2021)   Social Connection and Isolation Panel [NHANES]    Frequency of Communication with Friends and Family: More than three times a week    Frequency of Social Gatherings with Friends and Family: More than three times a week    Attends Religious Services: More than 4 times per year    Active Member of Clubs or Organizations: Yes    Attends Banker Meetings: More than 4 times per year    Marital Status: Married   Vitals:   02/13/22 0758  BP: 128/80  Pulse: 84  Resp: 16  Temp: 98.7 F (37.1 C)  SpO2: 97%  Body mass index is 29.6 kg/m.  Wt Readings from Last 3 Encounters:  02/13/22 167 lb 2 oz (75.8 kg)  08/10/21 167 lb 2 oz (75.8 kg)  07/20/21 166 lb 2 oz (75.4 kg)    Physical Exam Vitals and nursing note reviewed.  Constitutional:      General: She is not in acute distress.    Appearance: She is well-developed.  HENT:     Head: Normocephalic and atraumatic.     Right Ear: Tympanic membrane, ear canal and external ear normal.     Left Ear: External ear normal.     Ears:     Comments: Right ear canal excess cerumen, cannot see TM.    Mouth/Throat:     Mouth: Mucous  membranes are moist.     Pharynx: Oropharynx is clear. Uvula midline.  Eyes:     Extraocular Movements: Extraocular movements intact.     Conjunctiva/sclera:     Right eye: Right conjunctiva is not injected. No exudate or hemorrhage.    Pupils:     Right eye: Pupil is round and reactive.     Comments: Right corneal scaring changes.  Neck:     Thyroid: No thyroid mass.     Trachea: No tracheal deviation.  Cardiovascular:     Rate and Rhythm: Normal rate and regular rhythm.     Pulses:          Dorsalis pedis pulses are 2+ on the right side and 2+ on the left side.     Heart sounds: No murmur heard. Pulmonary:     Effort: Pulmonary effort is normal. No respiratory distress.     Breath sounds: Normal breath sounds.  Abdominal:     Palpations: Abdomen is soft. There is no hepatomegaly or mass.     Tenderness: There is no abdominal tenderness.  Genitourinary:    Comments: Deferred to gyn. Musculoskeletal:     Comments: No major deformity or signs of synovitis appreciated.  Lymphadenopathy:     Cervical: No cervical adenopathy.     Upper Body:     Right upper body: No supraclavicular adenopathy.     Left upper body: No supraclavicular adenopathy.  Skin:    General: Skin is warm.     Findings: No erythema or rash.  Neurological:     General: No focal deficit present.     Mental Status: She is alert and oriented to person, place, and time.     Cranial Nerves: No cranial nerve deficit.     Coordination: Coordination normal.     Gait: Gait normal.     Deep Tendon Reflexes:     Reflex Scores:      Bicep reflexes are 2+ on the right side and 2+ on the left side.      Patellar reflexes are  2+ on the right side and 2+ on the left side. Psychiatric:     Comments: Well groomed, good eye contact.   ASSESSMENT AND PLAN: Ms. KEIRI TRITLE was here today annual physical examination.  Orders Placed This Encounter  Procedures   Basic metabolic panel   Hemoglobin A1c   Lipid panel    Lab Results  Component Value Date   HGBA1C 6.1 02/13/2022   Lab Results  Component Value Date   CREATININE 1.08 02/13/2022   BUN 18 02/13/2022   NA 141 02/13/2022   K 3.6 02/13/2022   CL 107 02/13/2022   CO2 26 02/13/2022   Lab Results  Component Value Date   CHOL 179 02/13/2022   HDL 50.20 02/13/2022   LDLCALC 113 (H) 02/13/2022   TRIG 81.0 02/13/2022   CHOLHDL 4 02/13/2022   Routine general medical examination at a health care facility Assessment & Plan: We discussed the importance of regular physical activity and healthy diet for prevention of chronic illness and/or complications. Preventive guidelines reviewed. Vaccination: Tdap and shingrix at her pharmacy. Ca++ and vit D supplementation to continue. Next CPE in a year.   Hyperlipidemia, unspecified hyperlipidemia type Assessment & Plan: Continue Lovastatin 40 mg daily and low fat diet. Further recommendations according to FLP results.  Orders: -     Basic metabolic panel; Future -     Lipid panel; Future  Primary hypertension Assessment & Plan: BP adequately controlled. Continue enalapril 20 mg daily and verapamil 120 mg daily. Low-salt diet to continue. Eye exam scheduled for tomorrow.   Tremor, unspecified Assessment & Plan: It has been stable. We reviewed possible etiologies, prognosis,and treatment options. She does not want to start Primidone for now. She prefers to hold on neuro evaluation, will let me know if she decides to proceed with it. Monitor for new symptoms.   Prediabetes Assessment & Plan: Consistency with a healthy life style to contin ue for diabetes prevention.  Orders: -     Hemoglobin A1c; Future  Dermatitis of face Assessment & Plan: ? Eczema,seborrheic dermatitis among some to consider. Small amount of OTC Cortizone 1-2 times prn and daily moisturizer recommended. We reviewed some side effects of topical steroid.    Return in 6 months (on 08/15/2022) for chronic  problems.  Kiran Lapine G. [place], MD  [facility]. [place] office.

## 2022-02-13 ENCOUNTER — Encounter: Payer: Self-pay | Admitting: Family Medicine

## 2022-02-13 ENCOUNTER — Ambulatory Visit (INDEPENDENT_AMBULATORY_CARE_PROVIDER_SITE_OTHER): Payer: PPO | Admitting: Family Medicine

## 2022-02-13 VITALS — BP 128/80 | HR 84 | Temp 98.7°F | Resp 16 | Ht 63.0 in | Wt 167.1 lb

## 2022-02-13 DIAGNOSIS — E785 Hyperlipidemia, unspecified: Secondary | ICD-10-CM

## 2022-02-13 DIAGNOSIS — Z0001 Encounter for general adult medical examination with abnormal findings: Secondary | ICD-10-CM

## 2022-02-13 DIAGNOSIS — R251 Tremor, unspecified: Secondary | ICD-10-CM

## 2022-02-13 DIAGNOSIS — L309 Dermatitis, unspecified: Secondary | ICD-10-CM

## 2022-02-13 DIAGNOSIS — I10 Essential (primary) hypertension: Secondary | ICD-10-CM | POA: Diagnosis not present

## 2022-02-13 DIAGNOSIS — R7303 Prediabetes: Secondary | ICD-10-CM

## 2022-02-13 DIAGNOSIS — Z0000 Encounter for general adult medical examination without abnormal findings: Secondary | ICD-10-CM | POA: Insufficient documentation

## 2022-02-13 LAB — LIPID PANEL
Cholesterol: 179 mg/dL (ref 0–200)
HDL: 50.2 mg/dL (ref >39.00)
LDL Cholesterol: 113 mg/dL — ABNORMAL HIGH (ref 0–99)
NonHDL: 129.01
Total CHOL/HDL Ratio: 4
Triglycerides: 81 mg/dL (ref 0.0–149.0)
VLDL: 16.2 mg/dL (ref 0.0–40.0)

## 2022-02-13 LAB — BASIC METABOLIC PANEL
BUN: 18 mg/dL (ref 6–23)
CO2: 26 mEq/L (ref 19–32)
Calcium: 9.5 mg/dL (ref 8.4–10.5)
Chloride: 107 mEq/L (ref 96–112)
Creatinine, Ser: 1.08 mg/dL (ref 0.40–1.20)
GFR: 55.36 mL/min — ABNORMAL LOW (ref >60.00)
Glucose, Bld: 88 mg/dL (ref 70–99)
Potassium: 3.6 mEq/L (ref 3.5–5.1)
Sodium: 141 mEq/L (ref 135–145)

## 2022-02-13 LAB — HEMOGLOBIN A1C: Hgb A1c MFr Bld: 6.1 % (ref 4.6–6.5)

## 2022-02-13 NOTE — Assessment & Plan Note (Signed)
It has been stable. We reviewed possible etiologies, prognosis,and treatment options. She does not want to start Primidone for now. She prefers to hold on neuro evaluation, will let me know if she decides to proceed with it. Monitor for new symptoms.

## 2022-02-13 NOTE — Assessment & Plan Note (Signed)
Continue Lovastatin 40 mg daily and low fat diet. Further recommendations according to FLP results.

## 2022-02-13 NOTE — Patient Instructions (Addendum)
A few things to remember from today's visit:  Routine general medical examination at a health care facility  Hyperlipidemia, unspecified hyperlipidemia type - Plan: Basic metabolic panel, Lipid panel  Primary hypertension  Tremor, unspecified  Prediabetes - Plan: Hemoglobin A1c  Very small amount of over the counter Cortisone daily as needed for face rash.  If you need refills for medications you take chronically, please call your pharmacy. Do not use My Chart to request refills or for acute issues that need immediate attention. If you send a my chart message, it may take a few days to be addressed, specially if I am not in the office.  Please be sure medication list is accurate. If a new problem present, please set up appointment sooner than planned today.  Health Maintenance, Female Adopting a healthy lifestyle and getting preventive care are important in promoting health and wellness. Ask your health care provider about: The right schedule for you to have regular tests and exams. Things you can do on your own to prevent diseases and keep yourself healthy. What should I know about diet, weight, and exercise? Eat a healthy diet  Eat a diet that includes plenty of vegetables, fruits, low-fat dairy products, and lean protein. Do not eat a lot of foods that are high in solid fats, added sugars, or sodium. Maintain a healthy weight Body mass index (BMI) is used to identify weight problems. It estimates body fat based on height and weight. Your health care provider can help determine your BMI and help you achieve or maintain a healthy weight. Get regular exercise Get regular exercise. This is one of the most important things you can do for your health. Most adults should: Exercise for at least 150 minutes each week. The exercise should increase your heart rate and make you sweat (moderate-intensity exercise). Do strengthening exercises at least twice a week. This is in addition to the  moderate-intensity exercise. Spend less time sitting. Even light physical activity can be beneficial. Watch cholesterol and blood lipids Have your blood tested for lipids and cholesterol at 61 years of age, then have this test every 5 years. Have your cholesterol levels checked more often if: Your lipid or cholesterol levels are high. You are older than 61 years of age. You are at high risk for heart disease. What should I know about cancer screening? Depending on your health history and family history, you may need to have cancer screening at various ages. This may include screening for: Breast cancer. Cervical cancer. Colorectal cancer. Skin cancer. Lung cancer. What should I know about heart disease, diabetes, and high blood pressure? Blood pressure and heart disease High blood pressure causes heart disease and increases the risk of stroke. This is more likely to develop in people who have high blood pressure readings or are overweight. Have your blood pressure checked: Every 3-5 years if you are 27-35 years of age. Every year if you are 58 years old or older. Diabetes Have regular diabetes screenings. This checks your fasting blood sugar level. Have the screening done: Once every three years after age 60 if you are at a normal weight and have a low risk for diabetes. More often and at a younger age if you are overweight or have a high risk for diabetes. What should I know about preventing infection? Hepatitis B If you have a higher risk for hepatitis B, you should be screened for this virus. Talk with your health care provider to find out if you are  at risk for hepatitis B infection. Hepatitis C Testing is recommended for: Everyone born from [birth year] through 1965. Anyone with known risk factors for hepatitis C. Sexually transmitted infections (STIs) Get screened for STIs, including gonorrhea and chlamydia, if: You are sexually active and are younger than 61 years of age. You are  older than 61 years of age and your health care provider tells you that you are at risk for this type of infection. Your sexual activity has changed since you were last screened, and you are at increased risk for chlamydia or gonorrhea. Ask your health care provider if you are at risk. Ask your health care provider about whether you are at high risk for HIV. Your health care provider may recommend a prescription medicine to help prevent HIV infection. If you choose to take medicine to prevent HIV, you should first get tested for HIV. You should then be tested every 3 months for as long as you are taking the medicine. Pregnancy If you are about to stop having your period (premenopausal) and you may become pregnant, seek counseling before you get pregnant. Take 400 to 800 micrograms (mcg) of folic acid every day if you become pregnant. Ask for birth control (contraception) if you want to prevent pregnancy. Osteoporosis and menopause Osteoporosis is a disease in which the bones lose minerals and strength with aging. This can result in bone fractures. If you are 73 years old or older, or if you are at risk for osteoporosis and fractures, ask your health care provider if you should: Be screened for bone loss. Take a calcium or vitamin D supplement to lower your risk of fractures. Be given hormone replacement therapy (HRT) to treat symptoms of menopause. Follow these instructions at home: Alcohol use Do not drink alcohol if: Your health care provider tells you not to drink. You are pregnant, may be pregnant, or are planning to become pregnant. If you drink alcohol: Limit how much you have to: 0-1 drink a day. Know how much alcohol is in your drink. In the [place] one drink equals one 12 oz bottle of beer (355 mL), one 5 oz glass of wine (148 mL), or one 1 oz glass of hard liquor (44 mL). Lifestyle Do not use any products that contain nicotine or tobacco. These products include cigarettes, chewing  tobacco, and vaping devices, such as e-cigarettes. If you need help quitting, ask your health care provider. Do not use street drugs. Do not share needles. Ask your health care provider for help if you need support or information about quitting drugs. General instructions Schedule regular health, dental, and eye exams. Stay current with your vaccines. Tell your health care provider if: You often feel depressed. You have ever been abused or do not feel safe at home. Summary Adopting a healthy lifestyle and getting preventive care are important in promoting health and wellness. Follow your health care provider's instructions about healthy diet, exercising, and getting tested or screened for diseases. Follow your health care provider's instructions on monitoring your cholesterol and blood pressure. This information is not intended to replace advice given to you by your health care provider. Make sure you discuss any questions you have with your health care provider. Document Revised: 07/19/2020 Document Reviewed: 07/19/2020 Elsevier Patient Education  [employer].

## 2022-02-13 NOTE — Assessment & Plan Note (Signed)
BP adequately controlled. Continue enalapril 20 mg daily and verapamil 120 mg daily. Low-salt diet to continue. Eye exam scheduled for tomorrow.

## 2022-02-14 DIAGNOSIS — H18623 Keratoconus, unstable, bilateral: Secondary | ICD-10-CM | POA: Diagnosis not present

## 2022-02-14 DIAGNOSIS — Z947 Corneal transplant status: Secondary | ICD-10-CM | POA: Diagnosis not present

## 2022-02-16 MED ORDER — LOVASTATIN 40 MG PO TABS: 40.0000 mg | ORAL_TABLET | Freq: Every day | ORAL | 2 refills | Status: DC

## 2022-02-16 NOTE — Assessment & Plan Note (Signed)
We discussed the importance of regular physical activity and healthy diet for prevention of chronic illness and/or complications. Preventive guidelines reviewed. Vaccination: Tdap and shingrix at her pharmacy. Ca++ and vit D supplementation to continue. Next CPE in a year.

## 2022-02-16 NOTE — Assessment & Plan Note (Signed)
?   Eczema,seborrheic dermatitis among some to consider. Small amount of OTC Cortizone 1-2 times prn and daily moisturizer recommended. We reviewed some side effects of topical steroid.

## 2022-02-16 NOTE — Assessment & Plan Note (Signed)
Consistency with a healthy life style to contin ue for diabetes prevention.

## 2022-03-01 ENCOUNTER — Telehealth: Payer: Self-pay | Admitting: Pharmacist

## 2022-03-01 ENCOUNTER — Encounter: Payer: Self-pay | Admitting: Family Medicine

## 2022-03-01 NOTE — Chronic Care Management (AMB) (Signed)
Chronic Care Management Pharmacy Assistant   Name: Latoya Thompson  MRN: [id number] DOB: Aug 10, 1960  Reason for Encounter: Disease State / Hypertension Assessment Call   Conditions to be addressed/monitored: HTN  Recent office visits:  02/13/2022 Latoya Martinique MD - Patient was seen for Routine general medical examination at a health care facility and additional concerns. No medication changes. Follow up in 6 months.  09/09/2021 Latoya Martinique MD - Patient was seen for essential hypertension and hyperlipidemia. No additional chart notes.   Recent consult visits:  02/14/2022 Servando Snare MD [facility]) - Patient was seen for unstable keratoconus of both eyes. No medication changes. No follow up noted.   Hospital visits:  Patient was seen at [facility] on 12/12/2021 due to acute pain of right shoulder.   New?Medications Started at [facility] Discharge:?? predniSONE 20 MG tablet - Take 2 tablets  by mouth daily with breakfast for 3 days. Medication Changes at Hospital Discharge: No medication changes Medications Discontinued at Hospital Discharge: No medications discontinued Medications that remain the same after Hospital Discharge:??  -All other medications will remain the same.    Medications: Outpatient Encounter Medications as of 03/01/2022  Medication Sig   aspirin EC 81 MG tablet Take 81 mg by mouth daily.   Cholecalciferol (VITAMIN D3) 25 MCG (1000 UT) CAPS Take 1 capsule by mouth 3 (three) times a week.   enalapril (VASOTEC) 20 MG tablet Take 1 tablet by mouth once daily   lovastatin (MEVACOR) 40 MG tablet Take 1 tablet (40 mg total) by mouth at bedtime.   pantoprazole (PROTONIX) 20 MG tablet Take 1 tablet by mouth once daily   primidone (MYSOLINE) 50 MG tablet Take 0.5 tablets (25 mg total) by mouth at bedtime.   sodium fluoride (FLUORISHIELD) 1.1 % GEL dental gel SF 5000 Plus 1.1 % dental cream  BRUSH THOROUGHLY ONCE DAILY AT BEDTIME   verapamil (CALAN) 120 MG  tablet Take 1 tablet by mouth once daily   vitamin C (ASCORBIC ACID) 500 MG tablet Take 500 mg by mouth daily.   vitamin E 400 UNIT capsule Take 400 Units by mouth daily.   No facility-administered encounter medications on file as of 03/01/2022.  Fill History:   Dispensed Days Supply Quantity Provider Pharmacy  ENALAPRIL 20MG      TAB 12/01/2021 90 90 each      Dispensed Days Supply Quantity Provider Pharmacy  LOVASTATIN 40MG     TAB 02/16/2022 90 90 each      Dispensed Days Supply Quantity Provider Pharmacy  PANTOPRAZOLE 20MG   TAB 11/23/2021 90 90 each      Dispensed Days Supply Quantity Provider Pharmacy  VERAPAMIL 120MG     TAB 11/30/2021 90 90 each     Reviewed chart prior to disease state call. Spoke with patient regarding BP  Recent Office Vitals: BP Readings from Last 3 Encounters:  02/13/22 128/80  12/12/21 125/83  08/20/21 137/89   Pulse Readings from Last 3 Encounters:  02/13/22 84  12/12/21 71  08/20/21 76    Wt Readings from Last 3 Encounters:  02/13/22 167 lb 2 oz (75.8 kg)  08/10/21 167 lb 2 oz (75.8 kg)  07/20/21 166 lb 2 oz (75.4 kg)     Kidney Function Lab Results  Component Value Date/Time   CREATININE 1.08 02/13/2022 08:41 AM   CREATININE 1.09 07/20/2021 08:09 AM   CREATININE 1.02 11/26/2019 08:31 AM   GFR 55.36 (L) 02/13/2022 08:41 AM   GFRNONAA  60 11/26/2019 08:31 AM   GFRAA 70 11/26/2019 08:31 AM       Latest Ref Rng & Units 02/13/2022    8:41 AM 07/20/2021    8:09 AM 01/12/2021    8:37 AM  BMP  Glucose 70 - 99 mg/dL 88  87  94   BUN 6 - 23 mg/dL 18  15  16    Creatinine 0.40 - 1.20 mg/dL 1.08  1.09  1.00   Sodium 135 - 145 mEq/L 141  142  143   Potassium 3.5 - 5.1 mEq/L 3.6  3.5  3.9   Chloride 96 - 112 mEq/L 107  107  108   CO2 19 - 32 mEq/L 26  28  29    Calcium 8.4 - 10.5 mg/dL 9.5  9.4  9.4     Current antihypertensive regimen:  Enalapril 20 mg daily Verapamil 120 mg daily  How often are you checking your Blood Pressure?    Current home BP readings:   What recent interventions/DTPs have been made by any provider to improve Blood Pressure control since last CPP Visit: No recent interventions have been made.   Any recent hospitalizations or ED visits since last visit with CPP? Patient was seen at [facility] on 12/12/2021 due to acute pain of right shoulder.  What diet changes have been made to improve Blood Pressure Control?  Patient follows Breakfast - patient will have Lunch - patient will have Dinner - patient will have  What exercise is being done to improve your Blood Pressure Control?    Adherence Review: Is the patient currently on ACE/ARB medication? Yes Does the patient have >5 day gap between last estimated fill dates? No  Unable to reach patient after several attempts  Care Gaps: AWV - scheduled 06/26/2022 Last BP - 128/80 on 02/13/2022 Last A1C - 08/11/2021 on 02/13/2022 Shingrix - never done Tdap - overdue  Star Rating Drugs: Enalapril 20 mg - last filled 12/01/2021 90 DS at [facility] Lovastatin 40 mg - last filled 02/16/2022 90 DS at [facility] [phone]

## 2022-03-12 ENCOUNTER — Other Ambulatory Visit: Payer: Self-pay | Admitting: Family Medicine

## 2022-03-12 DIAGNOSIS — I10 Essential (primary) hypertension: Secondary | ICD-10-CM

## 2022-06-19 DIAGNOSIS — L218 Other seborrheic dermatitis: Secondary | ICD-10-CM | POA: Diagnosis not present

## 2022-06-28 ENCOUNTER — Ambulatory Visit (INDEPENDENT_AMBULATORY_CARE_PROVIDER_SITE_OTHER): Payer: PPO

## 2022-06-28 VITALS — Ht 63.0 in | Wt 148.0 lb

## 2022-06-28 DIAGNOSIS — Z0000 Encounter for general adult medical examination without abnormal findings: Secondary | ICD-10-CM | POA: Diagnosis not present

## 2022-06-28 NOTE — Progress Notes (Signed)
Subjective:   Latoya Thompson is a 62 y.o. female who presents for Medicare Annual (Subsequent) preventive examination.  Review of Systems    Virtual Visit via Telephone Note  I connected with  Latoya Thompson on 06/28/22 at  1:30 PM EDT by telephone and verified that I am speaking with the correct person using two identifiers.  Location: Patient: Home Provider: Office Persons participating in the virtual visit: patient/Nurse Health Advisor   I discussed the limitations, risks, security and privacy concerns of performing an evaluation and management service by telephone and the availability of in person appointments. The patient expressed understanding and agreed to proceed.  Interactive audio and video telecommunications were attempted between this nurse and patient, however failed, due to patient having technical difficulties OR patient did not have access to video capability.  We continued and completed visit with audio only.  Some vital signs may be absent or patient reported.   Tillie Rung, LPN  Cardiac Risk Factors include: advanced age (>39men, >26 women);hypertension     Objective:    Today's Vitals   06/28/22 1337  Weight: 148 lb (67.1 kg)  Height: 5\' 3"  (1.6 m)   Body mass index is 26.22 kg/m.     06/28/2022    1:44 PM 06/15/2021   10:37 AM 12/10/2019    2:51 PM 07/13/2016    5:56 PM 05/07/2016    2:43 PM 09/02/2015    9:51 PM  Advanced Directives  Does Patient Have a Medical Advance Directive? No No No No No No  Would patient like information on creating a medical advance directive? No - Patient declined No - Patient declined Yes (MAU/Ambulatory/Procedural Areas - Information given) No - Patient declined  No - patient declined information    Current Medications (verified) Outpatient Encounter Medications as of 06/28/2022  Medication Sig   aspirin EC 81 MG tablet Take 81 mg by mouth daily.   Cholecalciferol (VITAMIN D3) 25 MCG (1000 UT) CAPS Take 1 capsule  by mouth 3 (three) times a week.   enalapril (VASOTEC) 20 MG tablet Take 1 tablet by mouth once daily   lovastatin (MEVACOR) 40 MG tablet Take 1 tablet (40 mg total) by mouth at bedtime.   pantoprazole (PROTONIX) 20 MG tablet Take 1 tablet by mouth once daily   primidone (MYSOLINE) 50 MG tablet Take 0.5 tablets (25 mg total) by mouth at bedtime.   sodium fluoride (FLUORISHIELD) 1.1 % GEL dental gel SF 5000 Plus 1.1 % dental cream  BRUSH THOROUGHLY ONCE DAILY AT BEDTIME   verapamil (CALAN) 120 MG tablet Take 1 tablet by mouth once daily   vitamin C (ASCORBIC ACID) 500 MG tablet Take 500 mg by mouth daily.   vitamin E 400 UNIT capsule Take 400 Units by mouth daily.   No facility-administered encounter medications on file as of 06/28/2022.    Allergies (verified) Codeine   History: Past Medical History:  Diagnosis Date   Chicken pox    Hypercholesteremia    Hypertension    Keratoconus    Left   Past Surgical History:  Procedure Laterality Date   BREAST SURGERY     biopsy   CORNEAL TRANSPLANT     Family History  Problem Relation Age of Onset   Hyperlipidemia Mother    Heart disease Mother    Stroke Mother    Hypertension Mother    Diabetes Mother    Hyperlipidemia Father    Heart disease Father    Stroke Father  Hypertension Father    Diabetes Father    Social History   Socioeconomic History   Marital status: Married    Spouse name: Renae Fickle   Number of children: 0   Years of education: Not on file   Highest education level: Bachelor's degree (e.g., BA, AB, BS)  Occupational History   Occupation: Educator  Tobacco Use   Smoking status: Never   Smokeless tobacco: Never  Substance and Sexual Activity   Alcohol use: No   Drug use: No   Sexual activity: Not on file  Other Topics Concern   Not on file  Social History Narrative   Married, lives with husband Renae Fickle in a 2 story home. Drinks 1/2 cup of coffee a day, rare soda or tea. Exercise regularly for at least 60  minutes.   Social Determinants of Health   Financial Resource Strain: Low Risk  (06/28/2022)   Overall Financial Resource Strain (CARDIA)    Difficulty of Paying Living Expenses: Not hard at all  Food Insecurity: No Food Insecurity (06/28/2022)   Hunger Vital Sign    Worried About Running Out of Food in the Last Year: Never true    Ran Out of Food in the Last Year: Never true  Transportation Needs: No Transportation Needs (06/28/2022)   PRAPARE - Administrator, Civil Service (Medical): No    Lack of Transportation (Non-Medical): No  Physical Activity: Sufficiently Active (06/28/2022)   Exercise Vital Sign    Days of Exercise per Week: 4 days    Minutes of Exercise per Session: 60 min  Stress: No Stress Concern Present (06/28/2022)   [employer] of Occupational Health - Occupational Stress Questionnaire    Feeling of Stress : Not at all  Social Connections: Socially Integrated (06/28/2022)   Social Connection and Isolation Panel [NHANES]    Frequency of Communication with Friends and Family: More than three times a week    Frequency of Social Gatherings with Friends and Family: More than three times a week    Attends Religious Services: More than 4 times per year    Active Member of [employer] or Organizations: Yes    Attends Engineer, structural: More than 4 times per year    Marital Status: Married    Tobacco Counseling Counseling given: Not Answered   Clinical Intake:  Pre-visit preparation completed: No  Pain : No/denies pain     BMI - recorded: 26.22 Nutritional Status: BMI 25 -29 Overweight Nutritional Risks: None Diabetes: No  How often do you need to have someone help you when you read instructions, pamphlets, or other written materials from your doctor or pharmacy?: 1 - Never  Diabetic?  No  Interpreter Needed?: No  Information entered by :: Theresa Mulligan LPN   Activities of Daily Living    06/28/2022    1:42 PM 06/24/2022    3:28  PM  In your present state of health, do you have any difficulty performing the following activities:  Hearing? 0 0  Vision? 0 1  Difficulty concentrating or making decisions? 0 0  Walking or climbing stairs? 0 0  Dressing or bathing? 0 0  Doing errands, shopping? 0 0  Preparing Food and eating ? N N  Using the Toilet? N N  In the past six months, have you accidently leaked urine? N N  Do you have problems with loss of bowel control? N   Managing your Medications? N N  Managing your Finances? N N  Housekeeping  or managing your Housekeeping? N N    Patient Care Team: [place], Betty G, MD as PCP - General (Family Medicine) Verner Chol, [facility] (Inactive) as Pharmacist (Pharmacist)  Indicate any recent Medical Services you may have received from other than [facility] providers in the past year (date may be approximate).     Assessment:   This is a routine wellness examination for [place].  Hearing/Vision screen Hearing Screening - Comments:: Denies hearing difficulties   Vision Screening - Comments:: Wears rx glasses - up to date with routine eye exams with  Dr Quin Hoop  Dietary issues and exercise activities discussed: Exercise limited by: None identified   Goals Addressed               This Visit's Progress     Patient Stated (pt-stated)        Lose 10 lbs.       Depression Screen    06/28/2022    1:41 PM 02/13/2022    8:05 AM 07/20/2021    8:13 AM 06/15/2021   10:32 AM 01/12/2021    7:42 AM 12/10/2019    2:48 PM 11/29/2019   11:28 PM  PHQ 2/9 Scores  PHQ - 2 Score 0 0 0 0 0 0 0    Fall Risk    06/28/2022    1:44 PM 06/24/2022    3:28 PM 02/13/2022    8:05 AM 07/20/2021    8:13 AM 06/15/2021   10:36 AM  Fall Risk   Falls in the past year? 0 0 0 0 0  Number falls in past yr: 0 0 0 0 0  Injury with Fall? 0 0 0 0 0  Risk for fall due to : No Fall Risks  No Fall Risks No Fall Risks No Fall Risks  Follow up Falls prevention discussed  Falls evaluation completed Falls  evaluation completed     FALL RISK PREVENTION PERTAINING TO THE HOME:  Any stairs in or around the home? Yes  If so, are there any without handrails? No  Home free of loose throw rugs in walkways, pet beds, electrical cords, etc? Yes  Adequate lighting in your home to reduce risk of falls? Yes   ASSISTIVE DEVICES UTILIZED TO PREVENT FALLS:  Life alert? No  Use of a cane, walker or w/c? No  Grab bars in the bathroom? No  Shower chair or bench in shower? Yes Elevated toilet seat or a handicapped toilet? No   TIMED UP AND GO:  Was the test performed? No . Audio Visit   Cognitive Function:        06/28/2022    1:45 PM 06/15/2021   10:37 AM 12/10/2019    2:59 PM  6CIT Screen  What Year? 0 points 0 points 0 points  What month? 0 points 0 points 0 points  What time? 0 points 0 points   Count back from 20 0 points 0 points 0 points  Months in reverse 0 points 0 points 0 points  Repeat phrase 0 points 0 points 0 points  Total Score 0 points 0 points     Immunizations Immunization History  Administered Date(s) Administered   Hep A / Hep B 11/29/2016   Influenza Inj Mdck Quad Pf 01/15/2022   Influenza, Seasonal, Injecte, Preservative Fre 12/23/2014   Influenza,inj,Quad PF,6+ Mos 11/29/2016, 01/02/2018, 11/26/2018, 11/26/2019, 01/12/2021   Influenza-Unspecified 12/23/2014   PFIZER(Purple Top)SARS-COV-2 Vaccination 05/22/2019, 06/16/2019   PPD Test 11/29/2016   Pfizer Covid-19 Vaccine Bivalent Booster 33yrs &  up 12/17/2020   Tdap 02/09/2012   Unspecified SARS-COV-2 Vaccination 01/15/2022    TDAP status: Due, Education has been provided regarding the importance of this vaccine. Advised may receive this vaccine at local pharmacy or Health Dept. Aware to provide a copy of the vaccination record if obtained from local pharmacy or Health Dept. Verbalized acceptance and understanding.  Flu Vaccine status: Up to date    Covid-19 vaccine status: Completed vaccines  Qualifies  for Shingles Vaccine? Yes   Zostavax completed No   Shingrix Completed?: No.    Education has been provided regarding the importance of this vaccine. Patient has been advised to call insurance company to determine out of pocket expense if they have not yet received this vaccine. Advised may also receive vaccine at local pharmacy or Health Dept. Verbalized acceptance and understanding.  Screening Tests Health Maintenance  Topic Date Due   DTaP/Tdap/Td (2 - Td or Tdap) 02/08/2022   COVID-19 Vaccine (5 - 2023-24 season) 07/14/2022 (Originally 03/12/2022)   Zoster Vaccines- Shingrix (1 of 2) 09/27/2022 (Originally 05/31/2010)   INFLUENZA VACCINE  10/12/2022   Medicare Annual Wellness (AWV)  06/28/2023   PAP SMEAR-Modifier  12/17/2023   MAMMOGRAM  01/28/2024   COLONOSCOPY (Pts 45-77yrs Insurance coverage will need to be confirmed)  10/04/2025   Hepatitis C Screening  Completed   HIV Screening  Completed   HPV VACCINES  Aged Out    Health Maintenance  Health Maintenance Due  Topic Date Due   DTaP/Tdap/Td (2 - Td or Tdap) 02/08/2022    Colorectal cancer screening: Type of screening: Colonoscopy. Completed 10/05/15. Repeat every 10 years  Mammogram status: Completed 01/27/22. Repeat every year    Lung Cancer Screening: (Low Dose CT Chest recommended if Age 36-80 years, 30 pack-year currently smoking OR have quit w/in 15years.) does not qualify.     Additional Screening:  Hepatitis C Screening: does qualify; Completed 08/17/14  Vision Screening: Recommended annual ophthalmology exams for early detection of glaucoma and other disorders of the eye. Is the patient up to date with their annual eye exam?  Yes  Who is the provider or what is the name of the office in which the patient attends annual eye exams? Dr Quin Hoop If pt is not established with a provider, would they like to be referred to a provider to establish care? No .   Dental Screening: Recommended annual dental exams for  proper oral hygiene  Community Resource Referral / Chronic Care Management:  CRR required this visit?  No   CCM required this visit?  No      Plan:     I have personally reviewed and noted the following in the patient's chart:   Medical and social history Use of alcohol, tobacco or illicit drugs  Current medications and supplements including opioid prescriptions. Patient is not currently taking opioid prescriptions. Functional ability and status Nutritional status Physical activity Advanced directives List of other physicians Hospitalizations, surgeries, and ER visits in previous 12 months Vitals Screenings to include cognitive, depression, and falls Referrals and appointments  In addition, I have reviewed and discussed with patient certain preventive protocols, quality metrics, and best practice recommendations. A written personalized care plan for preventive services as well as general preventive health recommendations were provided to patient.     Tillie Rung, LPN   [id number]/61/0960   Nurse Notes: None

## 2022-06-28 NOTE — Patient Instructions (Addendum)
Latoya Thompson , Thank you for taking time to come for your Medicare Wellness Visit. I appreciate your ongoing commitment to your health goals. Please review the following plan we discussed and let me know if I can assist you in the future.   These are the goals we discussed:  Goals       Patient Stated (pt-stated)      Lose 10 lbs.        This is a list of the screening recommended for you and due dates:  Health Maintenance  Topic Date Due   DTaP/Tdap/Td vaccine (2 - Td or Tdap) 02/08/2022   COVID-19 Vaccine (5 - 2023-24 season) 07/14/2022*   Zoster (Shingles) Vaccine (1 of 2) 09/27/2022*   Flu Shot  10/12/2022   Medicare Annual Wellness Visit  06/28/2023   Pap Smear  12/17/2023   Mammogram  01/28/2024   Colon Cancer Screening  10/04/2025   Hepatitis C Screening: USPSTF Recommendation to screen - Ages 18-79 yo.  Completed   HIV Screening  Completed   HPV Vaccine  Aged Out  *Topic was postponed. The date shown is not the original due date.    Advanced directives: Advance directive discussed with you today. Even though you declined this today, please call our office should you change your mind, and we can give you the proper paperwork for you to fill out.   Conditions/risks identified: None  Next appointment: Follow up in one year for your annual wellness visit.    Preventive Care 40-64 Years, Female Preventive care refers to lifestyle choices and visits with your health care provider that can promote health and wellness. What does preventive care include? A yearly physical exam. This is also called an annual well check. Dental exams once or twice a year. Routine eye exams. Ask your health care provider how often you should have your eyes checked. Personal lifestyle choices, including: Daily care of your teeth and gums. Regular physical activity. Eating a healthy diet. Avoiding tobacco and drug use. Limiting alcohol use. Practicing safe sex. Taking low-dose aspirin daily  starting at age 57. Taking vitamin and mineral supplements as recommended by your health care provider. What happens during an annual well check? The services and screenings done by your health care provider during your annual well check will depend on your age, overall health, lifestyle risk factors, and family history of disease. Counseling  Your health care provider may ask you questions about your: Alcohol use. Tobacco use. Drug use. Emotional well-being. Home and relationship well-being. Sexual activity. Eating habits. Work and work Astronomer. Method of birth control. Menstrual cycle. Pregnancy history. Screening  You may have the following tests or measurements: Height, weight, and BMI. Blood pressure. Lipid and cholesterol levels. These may be checked every 5 years, or more frequently if you are over 80 years old. Skin check. Lung cancer screening. You may have this screening every year starting at age 90 if you have a 30-pack-year history of smoking and currently smoke or have quit within the past 15 years. Fecal occult blood test (FOBT) of the stool. You may have this test every year starting at age 25. Flexible sigmoidoscopy or colonoscopy. You may have a sigmoidoscopy every 5 years or a colonoscopy every 10 years starting at age 63. Hepatitis C blood test. Hepatitis B blood test. Sexually transmitted disease (STD) testing. Diabetes screening. This is done by checking your blood sugar (glucose) after you have not eaten for a while (fasting). You may have this done  every 1-3 years. Mammogram. This may be done every 1-2 years. Talk to your health care provider about when you should start having regular mammograms. This may depend on whether you have a family history of breast cancer. BRCA-related cancer screening. This may be done if you have a family history of breast, ovarian, tubal, or peritoneal cancers. Pelvic exam and Pap test. This may be done every 3 years starting  at age 43. Starting at age 78, this may be done every 5 years if you have a Pap test in combination with an HPV test. Bone density scan. This is done to screen for osteoporosis. You may have this scan if you are at high risk for osteoporosis. Discuss your test results, treatment options, and if necessary, the need for more tests with your health care provider. Vaccines  Your health care provider may recommend certain vaccines, such as: Influenza vaccine. This is recommended every year. Tetanus, diphtheria, and acellular pertussis (Tdap, Td) vaccine. You may need a Td booster every 10 years. Zoster vaccine. You may need this after age 81. Pneumococcal 13-valent conjugate (PCV13) vaccine. You may need this if you have certain conditions and were not previously vaccinated. Pneumococcal polysaccharide (PPSV23) vaccine. You may need one or two doses if you smoke cigarettes or if you have certain conditions. Talk to your health care provider about which screenings and vaccines you need and how often you need them. This information is not intended to replace advice given to you by your health care provider. Make sure you discuss any questions you have with your health care provider. Document Released: 03/26/2015 Document Revised: 11/17/2015 Document Reviewed: 12/29/2014 Elsevier Interactive Patient Education  2017 [employer].    Fall Prevention in the Home Falls can cause injuries. They can happen to people of all ages. There are many things you can do to make your home safe and to help prevent falls. What can I do on the outside of my home? Regularly fix the edges of walkways and driveways and fix any cracks. Remove anything that might make you trip as you walk through a door, such as a raised step or threshold. Trim any bushes or trees on the path to your home. Use bright outdoor lighting. Clear any walking paths of anything that might make someone trip, such as rocks or tools. Regularly check  to see if handrails are loose or broken. Make sure that both sides of any steps have handrails. Any raised decks and porches should have guardrails on the edges. Have any leaves, snow, or ice cleared regularly. Use sand or salt on walking paths during winter. Clean up any spills in your garage right away. This includes oil or grease spills. What can I do in the bathroom? Use night lights. Install grab bars by the toilet and in the tub and shower. Do not use towel bars as grab bars. Use non-skid mats or decals in the tub or shower. If you need to sit down in the shower, use a plastic, non-slip stool. Keep the floor dry. Clean up any water that spills on the floor as soon as it happens. Remove soap buildup in the tub or shower regularly. Attach bath mats securely with double-sided non-slip rug tape. Do not have throw rugs and other things on the floor that can make you trip. What can I do in the bedroom? Use night lights. Make sure that you have a light by your bed that is easy to reach. Do not use any sheets  or blankets that are too big for your bed. They should not hang down onto the floor. Have a firm chair that has side arms. You can use this for support while you get dressed. Do not have throw rugs and other things on the floor that can make you trip. What can I do in the kitchen? Clean up any spills right away. Avoid walking on wet floors. Keep items that you use a lot in easy-to-reach places. If you need to reach something above you, use a strong step stool that has a grab bar. Keep electrical cords out of the way. Do not use floor polish or wax that makes floors slippery. If you must use wax, use non-skid floor wax. Do not have throw rugs and other things on the floor that can make you trip. What can I do with my stairs? Do not leave any items on the stairs. Make sure that there are handrails on both sides of the stairs and use them. Fix handrails that are broken or loose. Make  sure that handrails are as long as the stairways. Check any carpeting to make sure that it is firmly attached to the stairs. Fix any carpet that is loose or worn. Avoid having throw rugs at the top or bottom of the stairs. If you do have throw rugs, attach them to the floor with carpet tape. Make sure that you have a light switch at the top of the stairs and the bottom of the stairs. If you do not have them, ask someone to add them for you. What else can I do to help prevent falls? Wear shoes that: Do not have high heels. Have rubber bottoms. Are comfortable and fit you well. Are closed at the toe. Do not wear sandals. If you use a stepladder: Make sure that it is fully opened. Do not climb a closed stepladder. Make sure that both sides of the stepladder are locked into place. Ask someone to hold it for you, if possible. Clearly mark and make sure that you can see: Any grab bars or handrails. First and last steps. Where the edge of each step is. Use tools that help you move around (mobility aids) if they are needed. These include: Canes. Walkers. Scooters. Crutches. Turn on the lights when you go into a dark area. Replace any light bulbs as soon as they burn out. Set up your furniture so you have a clear path. Avoid moving your furniture around. If any of your floors are uneven, fix them. If there are any pets around you, be aware of where they are. Review your medicines with your doctor. Some medicines can make you feel dizzy. This can increase your chance of falling. Ask your doctor what other things that you can do to help prevent falls. This information is not intended to replace advice given to you by your health care provider. Make sure you discuss any questions you have with your health care provider. Document Released: 12/24/2008 Document Revised: 08/05/2015 Document Reviewed: 04/03/2014 Elsevier Interactive Patient Education  2017 [employer].

## 2022-08-15 NOTE — Progress Notes (Signed)
Care Management & Coordination Services Pharmacy Note  08/18/2022 Name:  Latoya Thompson MRN:  [id number] DOB:  Jun 09, 1960  Summary: BP at goal <130/80 LDL not at goal <100, has switched taking the lovastatin to bedtime  Recommendations/Changes made from today's visit: -Counseled to continue to check BP at least once weekly and keep a log -Counseled on cholesterol lowering diet  Follow up plan: BP call in 3 months Pharmacist visit in 6 months   Subjective: Latoya Thompson is an 62 y.o. year old female who is a primary patient of [place], Timoteo Expose, MD.  The care coordination team was consulted for assistance with disease management and care coordination needs.    Engaged with patient by telephone for follow up visit.  Recent office visits: 06/28/22 Theresa Mulligan, LPN - For AWV, no med changes  02/13/2022 Betty [place] MD - Patient was seen for Routine general medical examination at a health care facility and additional concerns. No medication changes. Follow up in 6 months.   Recent consult visits: 02/14/2022 Virgina Jock MD [facility]) - Patient was seen for unstable keratoconus of both eyes. No medication changes. No follow up noted.   Hospital visits: None in previous 6 months   Objective:  Lab Results  Component Value Date   CREATININE 1.08 02/13/2022   BUN 18 02/13/2022   GFR 55.36 (L) 02/13/2022   GFRNONAA 60 11/26/2019   GFRAA 70 11/26/2019   NA 141 02/13/2022   K 3.6 02/13/2022   CALCIUM 9.5 02/13/2022   CO2 26 02/13/2022   GLUCOSE 88 02/13/2022    Lab Results  Component Value Date/Time   HGBA1C 6.1 02/13/2022 08:41 AM   HGBA1C 5.9 07/20/2021 08:09 AM   GFR 55.36 (L) 02/13/2022 08:41 AM   GFR 54.97 (L) 07/20/2021 08:09 AM   MICROALBUR 0.9 07/12/2020 08:40 AM    Last diabetic Eye exam: No results found for: "HMDIABEYEEXA"  Last diabetic Foot exam: No results found for: "HMDIABFOOTEX"   Lab Results  Component Value Date   CHOL 179 02/13/2022   HDL  50.20 02/13/2022   LDLCALC 113 (H) 02/13/2022   TRIG 81.0 02/13/2022   CHOLHDL 4 02/13/2022       Latest Ref Rng & Units 07/20/2021    8:09 AM 01/12/2021    8:37 AM 07/12/2020    8:40 AM  Hepatic Function  Total Protein 6.0 - 8.3 g/dL 7.5  7.6  7.4   Albumin 3.5 - 5.2 g/dL 4.3  4.3  4.4   AST 0 - 37 U/L 25  23  21    ALT 0 - 35 U/L 16  17  17    Alk Phosphatase 39 - 117 U/L 100  110  121   Total Bilirubin 0.2 - 1.2 mg/dL 0.7  0.6  0.6   Bilirubin, Direct 0.0 - 0.3 mg/dL  0.0  0.1     Lab Results  Component Value Date/Time   TSH 1.64 07/12/2020 08:40 AM   TSH 0.76 01/04/2018 07:40 AM       Latest Ref Rng & Units 07/13/2016    5:55 PM 09/02/2015   10:01 PM  CBC  WBC 4.0 - 10.5 K/uL 7.1  5.9   Hemoglobin 12.0 - 15.0 g/dL 40.9  81.1   Hematocrit 36.0 - 46.0 % 45.8  42.2   Platelets 150 - 400 K/uL 195  168     Lab Results  Component Value Date/Time   VITAMINB12 405 01/04/2018 07:40 AM    Clinical ASCVD:  No  The ASCVD Risk score (Arnett DK, et al., 2019) failed to calculate for the following reasons:   The systolic blood pressure is missing       06/28/2022    1:41 PM 02/13/2022    8:05 AM 07/20/2021    8:13 AM  Depression screen PHQ 2/9  Decreased Interest 0 0 0  Down, Depressed, Hopeless 0 0 0  PHQ - 2 Score 0 0 0     Social History   Tobacco Use  Smoking Status Never  Smokeless Tobacco Never   BP Readings from Last 3 Encounters:  02/13/22 128/80  12/12/21 125/83  08/20/21 137/89   Pulse Readings from Last 3 Encounters:  02/13/22 84  12/12/21 71  08/20/21 76   Wt Readings from Last 3 Encounters:  06/28/22 148 lb (67.1 kg)  02/13/22 167 lb 2 oz (75.8 kg)  08/10/21 167 lb 2 oz (75.8 kg)   BMI Readings from Last 3 Encounters:  06/28/22 26.22 kg/m  02/13/22 29.60 kg/m  08/10/21 29.60 kg/m    Allergies  Allergen Reactions   Codeine Itching    Medications Reviewed Today     Reviewed by Sherrill Raring, RPH (Pharmacist) on 08/18/22 at 1311   Med List Status: <None>   Medication Order Taking? Sig Documenting Provider Last Dose Status Informant  aspirin EC 81 MG tablet [id number] No Take 81 mg by mouth daily. [provider] Taking Active   Cholecalciferol (VITAMIN D3) 25 MCG (1000 UT) CAPS [id number] No Take 1 capsule by mouth 3 (three) times a week. [provider] Taking Active   enalapril (VASOTEC) 20 MG tablet [id number]  Take 1 tablet by mouth once daily [place], Betty G, MD  Active   lovastatin (MEVACOR) 40 MG tablet [id number]  Take 1 tablet (40 mg total) by mouth at bedtime. [place], Betty G, MD  Active   pantoprazole (PROTONIX) 20 MG tablet [id number] No Take 1 tablet by mouth once daily [place], Betty G, MD Taking Active   primidone (MYSOLINE) 50 MG tablet [id number] No Take 0.5 tablets (25 mg total) by mouth at bedtime. [place], Betty G, MD Taking Active   sodium fluoride (FLUORISHIELD) 1.1 % GEL dental gel [id number] No SF 5000 Plus 1.1 % dental cream  BRUSH THOROUGHLY ONCE DAILY AT BEDTIME [provider] Taking Active   verapamil (CALAN) 120 MG tablet [id number]  Take 1 tablet by mouth once daily [place], Betty G, MD  Active   vitamin C (ASCORBIC ACID) 500 MG tablet [id number] No Take 500 mg by mouth daily. [provider] Taking Active Self  vitamin E 400 UNIT capsule [id number] No Take 400 Units by mouth daily. [provider] Taking Active             SDOH:  (Social Determinants of Health) assessments and interventions performed: Yes SDOH Interventions    Flowsheet Row Care Coordination from 08/18/2022 in [facility] Clinical Support from 06/28/2022 in [facility] [place] Clinical Support from 06/15/2021 in [facility] [place] Chronic Care Management from 07/01/2019 in [facility] [place]  SDOH Interventions      Food Insecurity Interventions Intervention Not Indicated Intervention Not Indicated  Intervention Not Indicated --  Housing Interventions Intervention Not Indicated Intervention Not Indicated Intervention Not Indicated --  Transportation Interventions -- Intervention Not Indicated Intervention Not Indicated Other (Comment)  [Not needed.]  Utilities Interventions -- Intervention Not Indicated -- --  Alcohol Usage Interventions -- Intervention  Not Indicated (Score <7) -- --  Financial Strain Interventions -- Intervention Not Indicated Intervention Not Indicated Other (Comment)  [Not needed.]  Physical Activity Interventions -- Intervention Not Indicated Intervention Not Indicated --  Stress Interventions -- Intervention Not Indicated Intervention Not Indicated --  Social Connections Interventions -- Intervention Not Indicated Intervention Not Indicated --       Medication Assistance: None required.  Patient affirms current coverage meets needs.  Medication Access: Name and location of current pharmacy:  [facility] [address] [place] - [address] [place] [address] Phone: [phone] Fax: [phone]  Within the past 30 days, how often has patient missed a dose of medication? None Is a pillbox or other method used to improve adherence? Yes  Factors that may affect medication adherence? no barriers identified Are meds synced by current pharmacy? No  Are meds delivered by current pharmacy? No  Does patient experience delays in picking up medications due to transportation concerns? No   Compliance/Adherence/Medication fill history: Care Gaps: Tdap vaccine - last 02/09/12 Covid booster - last 01/15/22  Star-Rating Drugs: Lovastatin 40mg  PDC 79% LF 05/17/22 90DS Enalapril 20mg  PDC 93%   Assessment/Plan Hypertension (BP goal <130/80) -Controlled -Current treatment: Enalapril 20mg  1 qd Appropriate, Effective, Safe, Accessible Verapamil 120mg  1qd Appropriate, Effective, Safe, Accessible -Medications previously tried:  None  -Current home readings: has a bp machine at home, once daily 124/80, 117/76 -Current dietary habits: mindful of salt intake -Current exercise habits: goes to [employer] 5 days/week -Denies hypotensive/hypertensive symptoms -Educated on BP goals and benefits of medications for prevention of heart attack, stroke and kidney damage; Daily salt intake goal < 2300 mg; Importance of home blood pressure monitoring; Proper BP monitoring technique; -Counseled to monitor BP at home at least once weekly, document, and provide log at future appointments -Counseled on diet and exercise extensively Recommended to continue current medication  Hyperlipidemia: (LDL goal < 100) -Controlled -Current treatment: Lovastatin 40mg  1 qd -Medications previously tried: None  -Current dietary patterns: is mindful to buy products with low fat or no fat content -Current exercise habits: see above -Educated on Cholesterol goals;  Benefits of statin for ASCVD risk reduction; Importance of limiting foods high in cholesterol; Exercise goal of 150 minutes per week; -Counseled on diet and exercise extensively Recommended to continue current medication  Sherrill Raring Clinical Pharmacist [phone]

## 2022-08-17 ENCOUNTER — Telehealth: Payer: Self-pay

## 2022-08-17 NOTE — Progress Notes (Signed)
Care Management & Coordination Services Pharmacy Team  Reason for Encounter: Appointment Reminder  Contacted patient to confirm telephone appointment with Delano Metz, PharmD on 08/18/2022 at 1:00. Spoke with patient on 08/17/2022   Care Gaps: AWV - completed 06/28/2022 Last BP - 128/80 on 02/13/2022 Last A1C - 08/11/2021 on 02/13/2022 Tdap - overdue Covid - overdue Shingrix - postponed   Star Rating Drugs: Enalapril 20 mg - last filled 06/07/2022 90 DS at [facility] Lovastatin 40 mg - last filled 08/14/2022 90 DS at [facility]  Inetta Fermo [facility]  Clinical Pharmacist Assistant [phone]

## 2022-08-18 ENCOUNTER — Ambulatory Visit: Payer: PPO

## 2022-08-24 DIAGNOSIS — H18623 Keratoconus, unstable, bilateral: Secondary | ICD-10-CM | POA: Diagnosis not present

## 2022-08-24 DIAGNOSIS — Z947 Corneal transplant status: Secondary | ICD-10-CM | POA: Diagnosis not present

## 2022-08-29 ENCOUNTER — Encounter: Payer: Self-pay | Admitting: Family Medicine

## 2022-08-29 ENCOUNTER — Ambulatory Visit (INDEPENDENT_AMBULATORY_CARE_PROVIDER_SITE_OTHER): Payer: PPO | Admitting: Family Medicine

## 2022-08-29 VITALS — BP 118/76 | HR 75 | Temp 98.2°F | Resp 16 | Ht 63.0 in | Wt 152.0 lb

## 2022-08-29 DIAGNOSIS — H18623 Keratoconus, unstable, bilateral: Secondary | ICD-10-CM | POA: Diagnosis not present

## 2022-08-29 DIAGNOSIS — H04202 Unspecified epiphora, left lacrimal gland: Secondary | ICD-10-CM

## 2022-08-29 DIAGNOSIS — R251 Tremor, unspecified: Secondary | ICD-10-CM | POA: Diagnosis not present

## 2022-08-29 DIAGNOSIS — J302 Other seasonal allergic rhinitis: Secondary | ICD-10-CM

## 2022-08-29 DIAGNOSIS — H6122 Impacted cerumen, left ear: Secondary | ICD-10-CM

## 2022-08-29 MED ORDER — DEBROX 6.5 % OT SOLN: 5.0000 [drp] | Freq: Two times a day (BID) | OTIC | 0 refills | Status: DC

## 2022-08-29 MED ORDER — AZELASTINE HCL 0.1 % NA SOLN: 1.0000 | Freq: Two times a day (BID) | NASAL | 6 refills | Status: DC

## 2022-08-29 NOTE — Assessment & Plan Note (Signed)
Following with Dr. Quin Hoop. 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

## 2022-08-29 NOTE — Assessment & Plan Note (Signed)
We discussed Dx and prognosis. Recommend Astelin nasal spray bid and nasal saline irrigations as needed through the day.

## 2022-08-29 NOTE — Assessment & Plan Note (Addendum)
Not appreciated today. Most likely essential tremor, problem is otherwise stable. She is not interested in pharmacologic treatment nor neurologic evaluation at this time. Continue monitoring for new symptoms.

## 2022-08-29 NOTE — Progress Notes (Signed)
ACUTE VISIT Chief Complaint  Patient presents with   Eye Problem    Left eye has inflammation, watering. Saw eye doctor last week.   HPI: [name] is a 62 y.o. female, who is here today complaining of left eye "inflammation", which has been ongoing for almost two weeks. She is currently being followed by her ophthalmologist for this issue, seen on 08/24/22.   Eye Problem  The left eye is affected. This is a chronic problem. The current episode started more than 1 month ago. The problem occurs constantly. The problem has been unchanged. There is No known exposure to pink eye. She Does not wear contacts. Associated symptoms include blurred vision and photophobia. Pertinent negatives include no eye discharge, eye redness, fever, foreign body sensation, itching, nausea, recent URI, vomiting or weakness. She has tried eye drops for the symptoms.   Hx of bilateral keratoconus, corneal transplant, and ocular hypertension. According to pt, she has been told she needs a new corneal transplant for left eye.  Additionally, she reports experiencing watery eyes and a feeling of stuffiness in her nose and eyes, particularly at nighttime. However, she does not believe it is a sinus infection, as she has had one before. She has previously been on medication for allergies but has stopped taking it.  Negative for fever,chills, or changes in appetite.  Over the weekend, she experienced a sensation of her left ear being stopped up, but it has since improved. She has not noticed any earache or drainage.  No recent URI or travel.  She also mentions hand tremor, worse with tasks such as putting in eye drops or wearing makeup as well as other manual activities like typing.  It is worse at the end of the day.  Problem has been going on for a while, her sister has similar problem.  We discussed Primidone but she has concerns about potential side effects.  It is otherwise stable.   Review of Systems   Constitutional:  Negative for fever.  HENT:  Positive for congestion, postnasal drip and rhinorrhea. Negative for mouth sores, sinus pain and sore throat.   Eyes:  Positive for blurred vision and photophobia. Negative for discharge, redness and itching.  Respiratory:  Negative for cough and wheezing.   Gastrointestinal:  Negative for nausea and vomiting.  Allergic/Immunologic: Positive for environmental allergies.  Neurological:  Negative for syncope, weakness and headaches.  See other pertinent positives and negatives in HPI.  Current Outpatient Medications on File Prior to Visit  Medication Sig Dispense Refill   aspirin EC 81 MG tablet Take 81 mg by mouth daily.     Cholecalciferol (VITAMIN D3) 25 MCG (1000 UT) CAPS Take 1 capsule by mouth 3 (three) times a week.     enalapril (VASOTEC) 20 MG tablet Take 1 tablet by mouth once daily 90 tablet 1   lovastatin (MEVACOR) 40 MG tablet Take 1 tablet (40 mg total) by mouth at bedtime. 90 tablet 2   pantoprazole (PROTONIX) 20 MG tablet Take 1 tablet by mouth once daily 90 tablet 3   primidone (MYSOLINE) 50 MG tablet Take 0.5 tablets (25 mg total) by mouth at bedtime. 30 tablet 1   sodium fluoride (FLUORISHIELD) 1.1 % GEL dental gel SF 5000 Plus 1.1 % dental cream  BRUSH THOROUGHLY ONCE DAILY AT BEDTIME     verapamil (CALAN) 120 MG tablet Take 1 tablet by mouth once daily 90 tablet 1   vitamin C (ASCORBIC ACID) 500 MG tablet Take 500  mg by mouth daily.     vitamin E 400 UNIT capsule Take 400 Units by mouth daily.     No current facility-administered medications on file prior to visit.    Past Medical History:  Diagnosis Date   Chicken pox    Hypercholesteremia    Hypertension    Keratoconus    Left   Allergies  Allergen Reactions   Codeine Itching    Social History   Socioeconomic History   Marital status: Married    Spouse name: Renae Fickle   Number of children: 0   Years of education: Not on file   Highest education level:  Bachelor's degree (e.g., BA, AB, BS)  Occupational History   Occupation: Educator  Tobacco Use   Smoking status: Never   Smokeless tobacco: Never  Substance and Sexual Activity   Alcohol use: No   Drug use: No   Sexual activity: Not on file  Other Topics Concern   Not on file  Social History Narrative   Married, lives with husband Renae Fickle in a 2 story home. Drinks 1/2 cup of coffee a day, rare soda or tea. Exercise regularly for at least 60 minutes.   Social Determinants of Health   Financial Resource Strain: Low Risk  (06/28/2022)   Overall Financial Resource Strain (CARDIA)    Difficulty of Paying Living Expenses: Not hard at all  Food Insecurity: No Food Insecurity (08/18/2022)   Hunger Vital Sign    Worried About Running Out of Food in the Last Year: Never true    Ran Out of Food in the Last Year: Never true  Transportation Needs: No Transportation Needs (06/28/2022)   PRAPARE - Administrator, Civil Service (Medical): No    Lack of Transportation (Non-Medical): No  Physical Activity: Sufficiently Active (06/28/2022)   Exercise Vital Sign    Days of Exercise per Week: 4 days    Minutes of Exercise per Session: 60 min  Stress: No Stress Concern Present (06/28/2022)   [employer] of Occupational Health - Occupational Stress Questionnaire    Feeling of Stress : Not at all  Social Connections: Socially Integrated (06/28/2022)   Social Connection and Isolation Panel [NHANES]    Frequency of Communication with Friends and Family: More than three times a week    Frequency of Social Gatherings with Friends and Family: More than three times a week    Attends Religious Services: More than 4 times per year    Active Member of [employer] or Organizations: Yes    Attends Banker Meetings: More than 4 times per year    Marital Status: Married    Vitals:   08/29/22 1155  BP: 118/76  Pulse: 75  Resp: 16  Temp: 98.2 F (36.8 C)  SpO2: 98%   Wt Readings from  Last 3 Encounters:  08/29/22 152 lb (68.9 kg)  06/28/22 148 lb (67.1 kg)  02/13/22 167 lb 2 oz (75.8 kg)   Body mass index is 26.93 kg/m.  Physical Exam Vitals and nursing note reviewed.  Constitutional:      General: She is not in acute distress.    Appearance: She is not ill-appearing.  HENT:     Head: Normocephalic and atraumatic.     Right Ear: Tympanic membrane, ear canal and external ear normal.     Left Ear: External ear normal. No tenderness.     Ears:     Comments: Cerumen impaction left ear canal.    Nose: Septal  deviation and congestion present.     Right Turbinates: Enlarged.     Left Turbinates: Enlarged.     Mouth/Throat:     Mouth: Mucous membranes are moist.     Pharynx: Oropharynx is clear.  Eyes:     General:        Right eye: No discharge.     Conjunctiva/sclera:     Right eye: Right conjunctiva is not injected. No exudate or hemorrhage.    Comments: Left-sided hazy cornea.  Cardiovascular:     Rate and Rhythm: Normal rate and regular rhythm.     Heart sounds: No murmur heard. Pulmonary:     Effort: Pulmonary effort is normal. No respiratory distress.     Breath sounds: Normal breath sounds.  Neurological:     General: No focal deficit present.     Mental Status: She is alert and oriented to person, place, and time.     Motor: No tremor.     Coordination: Coordination normal.     Gait: Gait normal.  Psychiatric:        Mood and Affect: Mood and affect normal.    ASSESSMENT AND PLAN: Ms. Bagnato was seen today for eye problem and congestion.  Left epiphora We discussed possible etiologies. Complications from keratoconus and corneal transplant are most likely the main cause and following with ophthalmologies. Seasonal allergies can be a contributing factor.  Hearing loss of left ear due to cerumen impaction After verbal consent, she agrees with ear lavage, unsuccessful. She gave verbal consent to proceed with manual removal using a curette. Most  of cerumen removed. Hearing improved. Ear Cerumen Removal  Date/Time: 08/29/2022 9:08 PM  Performed by: [place], Savier Trickett G, MD Authorized by: [place], Fifi Schindler G, MD   Anesthesia: Local Anesthetic: none Ceruminolytics applied: Ceruminolytics applied prior to the procedure. Location details: left ear Patient tolerance: patient tolerated the procedure well with no immediate complications Procedure type: curette   Before next visit, she can apply Debrox 7-10 days before, so we can repeat ear lavage if appropriate. Hearing Screening   500Hz  1000Hz  2000Hz  4000Hz   Right ear Pass Pass Pass Pass  Left ear Pass Pass Pass Pass    -     Debrox; Place 5 drops into the left ear 2 (two) times daily.  Dispense: 15 mL; Refill: 0  Tremor, unspecified Assessment & Plan: Not appreciated today. Most likely essential tremor, problem is otherwise stable. She is not interested in pharmacologic treatment nor neurologic evaluation at this time. Continue monitoring for new symptoms.  Seasonal allergic rhinitis, unspecified trigger Assessment & Plan: We discussed Dx and prognosis. Recommend Astelin nasal spray bid and nasal saline irrigations as needed through the day.  Orders: -     Azelastine HCl; Place 1 spray into both nostrils 2 (two) times daily. Use in each nostril as directed  Dispense: 30 mL; Refill: 6  Unstable keratoconus of both eyes Assessment & Plan: Following with Dr. Quin Hoop. 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  Return if symptoms worsen or fail to improve, for keep next appointment.  Arlita Buffkin G. [place], MD  [facility]. [place] office.

## 2022-08-29 NOTE — Patient Instructions (Addendum)
A few things to remember from today's visit:  Left epiphora  Tremor, unspecified  Seasonal allergic rhinitis, unspecified trigger - Plan: azelastine (ASTELIN) 0.1 % nasal spray  Unstable keratoconus of both eyes  Hearing loss of left ear due to cerumen impaction I was able to remove most of wax in the left ear, it is no longer impacted.  Before your next visit apply eardrops in left ear. Astelin nasal spray twice daily in each nostril. Nasal saline irrigations as needed throughout the day.  If you need refills for medications you take chronically, please call your pharmacy. Do not use My Chart to request refills or for acute issues that need immediate attention. If you send a my chart message, it may take a few days to be addressed, specially if I am not in the office.  Please be sure medication list is accurate. If a new problem present, please set up appointment sooner than planned today.

## 2022-09-01 ENCOUNTER — Other Ambulatory Visit: Payer: Self-pay | Admitting: Family Medicine

## 2022-09-01 DIAGNOSIS — I10 Essential (primary) hypertension: Secondary | ICD-10-CM

## 2022-09-08 NOTE — Progress Notes (Signed)
Date:  09/21/2022   ID:  Latoya Thompson, DOB November 21, 1960, MRN [id number]  PCP:  [place], Betty G, MD  Cardiologist:   Eden Emms Electrophysiologist:  None   Evaluation Performed:  Follow-Up Visit  History of Present Illness:    62 y.o.history of blindness, LBBB, HTN, HLD DM-2 and mild DCM with EF 45-50% by echo 05/01/16 stable since echo 2015.  She had myovue 07/22/13 which was non ischemic She complained of dyspnea but BNP was normal at 24.  Carotid duplex done for tinnitus showed plaque no stenosis 01/01/18 She is on disability due to her blindness.  Also with some left sided tremors   TTE 05/10/20 stable EF 45-50% mild MR abnormal septal motion mild LVH  Functional class one no symptoms Compliant with meds   See [facility] for corneal transplant has had mild tremor in Right hand discussed primidone with primary but deferred   No cardiac complaints Still has not f/u with neurologist  Past Medical History:  Diagnosis Date   Chicken pox    Hypercholesteremia    Hypertension    Keratoconus    Left   Past Surgical History:  Procedure Laterality Date   BREAST SURGERY     biopsy   CORNEAL TRANSPLANT       Current Meds  Medication Sig   aspirin EC 81 MG tablet Take 81 mg by mouth daily.   azelastine (ASTELIN) 0.1 % nasal spray Place 1 spray into both nostrils 2 (two) times daily. Use in each nostril as directed   carbamide peroxide (DEBROX) 6.5 % OTIC solution Place 5 drops into the left ear 2 (two) times daily.   Cholecalciferol (VITAMIN D3) 25 MCG (1000 UT) CAPS Take 1 capsule by mouth 3 (three) times a week.   enalapril (VASOTEC) 20 MG tablet Take 1 tablet by mouth once daily   lovastatin (MEVACOR) 40 MG tablet Take 1 tablet (40 mg total) by mouth at bedtime.   pantoprazole (PROTONIX) 20 MG tablet Take 1 tablet by mouth once daily   sodium fluoride (FLUORISHIELD) 1.1 % GEL dental gel SF 5000 Plus 1.1 % dental cream  BRUSH THOROUGHLY ONCE DAILY AT BEDTIME   verapamil (CALAN)  120 MG tablet Take 1 tablet by mouth once daily   vitamin C (ASCORBIC ACID) 500 MG tablet Take 500 mg by mouth daily.   vitamin E 400 UNIT capsule Take 400 Units by mouth daily.     Allergies:   Codeine   Social History   Tobacco Use   Smoking status: Never   Smokeless tobacco: Never  Substance Use Topics   Alcohol use: No   Drug use: No     Family Hx: The patient's family history includes Diabetes in her father and mother; Heart disease in her father and mother; Hyperlipidemia in her father and mother; Hypertension in her father and mother; Stroke in her father and mother.  ROS:   Please see the history of present illness.     All other systems reviewed and are negative.   Prior CV studies:   The following studies were reviewed today:  Carotid Duplex 12/27/17 Echo: 05/01/16 Myovue 07/22/13   Labs/Other Tests and Data Reviewed:    EKG:  09/21/2022 SR Rate 68 LBBB no changes   Recent Labs: 02/13/2022: BUN 18; Creatinine, Ser 1.08; Potassium 3.6; Sodium 141   Recent Lipid Panel Lab Results  Component Value Date/Time   CHOL 179 02/13/2022 08:41 AM   TRIG 81.0 02/13/2022 08:41 AM  HDL 50.20 02/13/2022 08:41 AM   CHOLHDL 4 02/13/2022 08:41 AM   LDLCALC 113 (H) 02/13/2022 08:41 AM   LDLCALC 124 (H) 11/26/2019 08:31 AM    Wt Readings from Last 3 Encounters:  09/21/22 147 lb 3.2 oz (66.8 kg)  08/29/22 152 lb (68.9 kg)  06/28/22 148 lb (67.1 kg)     Objective:    Vital Signs:  BP 126/82   Pulse 76   Ht 5\' 3"  (1.6 m)   Wt 147 lb 3.2 oz (66.8 kg)   SpO2 95%   BMI 26.08 kg/m    Affect appropriate Healthy:  appears stated age HEENT:Blind  Neck supple with no adenopathy JVP normal no bruits no thyromegaly Lungs clear with no wheezing and good diaphragmatic motion Heart:  S1/S2 no murmur, no rub, gallop or click PMI normal Abdomen: benighn, BS positve, no tenderness, no AAA no bruit.  No HSM or HJR Distal pulses intact with no bruits No edema Neuro  non-focal Skin warm and dry No muscular weakness    ASSESSMENT & PLAN:    1. DCM:  Stable EF over years 45-50% update TTE Continue ACE not volume overloaded  Update TTE 2. LBBB: chronic f/u ECG in a year  3. HTN:  Well controlled continue vasotec 4. HLD:  On statin labs with primary  5. Tinnitus :  F/u with primary and ENT as well as her blindness and tremors Suggested She get at least a CT head and possibly MRI  COVID-19 Education: The signs and symptoms of COVID-19 were discussed with the patient and how to seek care for testing (follow up with PCP or arrange E-visit).  The importance of social distancing was discussed today.  Medication Adjustments/Labs and Tests Ordered: Current medicines are reviewed at length with the patient today.  Concerns regarding medicines are outlined above.   Tests Ordered:  TTE  Medication Changes:  None   Disposition:  Follow up  in a year  Signed, Charlton Haws, MD  09/21/2022 9:55 AM    [facility]

## 2022-09-21 ENCOUNTER — Encounter: Payer: Self-pay | Admitting: Cardiovascular Disease

## 2022-09-21 ENCOUNTER — Ambulatory Visit: Payer: PPO | Attending: Cardiovascular Disease | Admitting: Cardiovascular Disease

## 2022-09-21 VITALS — BP 126/82 | HR 76 | Ht 63.0 in | Wt 147.2 lb

## 2022-09-21 DIAGNOSIS — E785 Hyperlipidemia, unspecified: Secondary | ICD-10-CM | POA: Diagnosis not present

## 2022-09-21 DIAGNOSIS — I420 Dilated cardiomyopathy: Secondary | ICD-10-CM

## 2022-09-21 DIAGNOSIS — I447 Left bundle-branch block, unspecified: Secondary | ICD-10-CM

## 2022-09-21 DIAGNOSIS — I10 Essential (primary) hypertension: Secondary | ICD-10-CM | POA: Diagnosis not present

## 2022-09-21 NOTE — Patient Instructions (Signed)
Medication Instructions:  Your physician recommends that you continue on your current medications as directed. Please refer to the Current Medication list given to you today.  *If you need a refill on your cardiac medications before your next appointment, please call your pharmacy*  Lab Work: If you have labs (blood work) drawn today and your tests are completely normal, you will receive your results only by: MyChart Message (if you have MyChart) OR A paper copy in the mail If you have any lab test that is abnormal or we need to change your treatment, we will call you to review the results.  Testing/Procedures: Your physician has requested that you have an echocardiogram. Echocardiography is a painless test that uses sound waves to create images of your heart. It provides your doctor with information about the size and shape of your heart and how well your heart's chambers and valves are working. This procedure takes approximately one hour. There are no restrictions for this procedure. Please do NOT wear cologne, perfume, aftershave, or lotions (deodorant is allowed). Please arrive 15 minutes prior to your appointment time.   Follow-Up: At [facility], you and your health needs are our priority.  As part of our continuing mission to provide you with exceptional heart care, we have created designated Provider Care Teams.  These Care Teams include your primary Cardiologist (physician) and Advanced Practice Providers (APPs -  Physician Assistants and Nurse Practitioners) who all work together to provide you with the care you need, when you need it.  We recommend signing up for the patient portal called "MyChart".  Sign up information is provided on this After Visit Summary.  MyChart is used to connect with patients for Virtual Visits (Telemedicine).  Patients are able to view lab/test results, encounter notes, upcoming appointments, etc.  Non-urgent messages can be sent to your provider as  well.   To learn more about what you can do with MyChart, go to [email or web address].    Your next appointment:   1 year(s)  Provider:   Peter Nishan, MD     

## 2022-10-02 DIAGNOSIS — N760 Acute vaginitis: Secondary | ICD-10-CM | POA: Diagnosis not present

## 2022-10-02 DIAGNOSIS — A609 Anogenital herpesviral infection, unspecified: Secondary | ICD-10-CM | POA: Diagnosis not present

## 2022-10-02 DIAGNOSIS — B3731 Acute candidiasis of vulva and vagina: Secondary | ICD-10-CM | POA: Diagnosis not present

## 2022-10-04 ENCOUNTER — Encounter: Payer: Self-pay | Admitting: Family Medicine

## 2022-10-06 ENCOUNTER — Ambulatory Visit: Payer: PPO | Admitting: Family Medicine

## 2022-10-06 ENCOUNTER — Encounter: Payer: Self-pay | Admitting: Family Medicine

## 2022-10-06 VITALS — BP 130/82 | HR 65 | Temp 98.1°F | Resp 16 | Ht 63.0 in | Wt 152.1 lb

## 2022-10-06 DIAGNOSIS — R251 Tremor, unspecified: Secondary | ICD-10-CM

## 2022-10-06 DIAGNOSIS — R7303 Prediabetes: Secondary | ICD-10-CM

## 2022-10-06 DIAGNOSIS — I10 Essential (primary) hypertension: Secondary | ICD-10-CM | POA: Diagnosis not present

## 2022-10-06 DIAGNOSIS — R519 Headache, unspecified: Secondary | ICD-10-CM

## 2022-10-06 LAB — HEPATIC FUNCTION PANEL
ALT: 17 U/L (ref 0–35)
AST: 23 U/L (ref 0–37)
Albumin: 4.3 g/dL (ref 3.5–5.2)
Alkaline Phosphatase: 109 U/L (ref 39–117)
Bilirubin, Direct: 0.1 mg/dL (ref 0.0–0.3)
Total Bilirubin: 0.5 mg/dL (ref 0.2–1.2)
Total Protein: 7.5 g/dL (ref 6.0–8.3)

## 2022-10-06 LAB — BASIC METABOLIC PANEL
BUN: 20 mg/dL (ref 6–23)
CO2: 28 mEq/L (ref 19–32)
Calcium: 9.8 mg/dL (ref 8.4–10.5)
Chloride: 104 mEq/L (ref 96–112)
Creatinine, Ser: 1.1 mg/dL (ref 0.40–1.20)
GFR: 53.91 mL/min — ABNORMAL LOW (ref >60.00)
Glucose, Bld: 83 mg/dL (ref 70–99)
Potassium: 4 mEq/L (ref 3.5–5.1)
Sodium: 140 mEq/L (ref 135–145)

## 2022-10-06 LAB — HEMOGLOBIN A1C: Hgb A1c MFr Bld: 5.6 % (ref 4.6–6.5)

## 2022-10-06 NOTE — Assessment & Plan Note (Signed)
She reports mildly elevated BPs at home during the time she was having headache, it is back to baseline. For now continue enalapril 20 mg daily and verapamil 120 mg daily. Recommend monitoring BP at home and to let me know what her readings in about 2 weeks. Continue low-salt diet. Eye exam is current.

## 2022-10-06 NOTE — Assessment & Plan Note (Signed)
We discussed possible etiologies, history and examination suggest essential tremor.  We discussed diagnosis, prognosis, and treatment options. I do not think neurology evaluation is needed at this time but it was still offered, we decided to hold on it. Continue monitoring for new symptoms. For now she is not interested in pharmacologic treatment.

## 2022-10-06 NOTE — Assessment & Plan Note (Signed)
Last hemoglobin A1c 6.1 in 02/2022. Encouraged to continue a healthy lifestyle for diabetes prevention. Further recommendation will be given according to hemoglobin A1c result.

## 2022-10-06 NOTE — Progress Notes (Unsigned)
ACUTE VISIT Chief Complaint  Patient presents with   Headache   HPI: [name] is a 62 y.o. 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 She describes the headache as aching, with a pain intensity of 5 out of 10, located in the left frontal region. She reports that half a Tylenol provided relief, and the headache resolved by yesterday. She denies any headache today, as well as any associated changes in vision, nausea, or vomiting.  Headache  This is a new problem. The problem occurs constantly. The problem has been resolved. The pain is located in the Left unilateral region. The pain does not radiate. The pain quality is not similar to prior headaches. The pain is at a severity of 5/10. The pain is moderate. Associated symptoms include tinnitus (chronic). Pertinent negatives include no abdominal pain, abnormal behavior, anorexia, back pain, coughing, dizziness, ear pain, facial sweating, fever, hearing loss, insomnia, loss of balance, nausea, neck pain, photophobia, rhinorrhea, scalp tenderness, seizures, sinus pressure, sore throat, swollen glands, tingling, visual change, vomiting, weakness or weight loss.  She denies any recent unusual events, stress, or dietary changes that could have caused the headache. She recently experienced a yeast infection, she saw her gynecologist this past Monday and received medication for the infection. She notes that her headache seemed to worsen the day after taking the medication.  Essential tremor, states that she has not tried primidone, problem is stable and reports that her sister has a similar condition.  She reports that her blood pressure is usually "good", around 120, sometimes 130/82 when checked  regularly without pain.  She is also concerned about a few elevated BPs at home, while she was having pain. 135/83,122/81,133/74,142/76,140/84,150/85,146/94, [id number]. Today BP was 128/80.  Currently she is on verapamil 120 mg daily and enalapril 20 mg daily. Before headache started, she was having BPs 120s to 130s/70s to 80. Negative for CP, dyspnea, palpitations, orthopnea, PND, decreased urine output, gross hematuria, or edema.  Lab Results  Component Value Date   NA 141 02/13/2022   CL 107 02/13/2022   K 3.6 02/13/2022   CO2 26 02/13/2022   BUN 18 02/13/2022   CREATININE 1.08 02/13/2022   GFR 55.36 (L) 02/13/2022   CALCIUM 9.5 02/13/2022   ALBUMIN 4.3 07/20/2021   GLUCOSE 88 02/13/2022   Lab Results  Component Value Date   ALT 16 07/20/2021   AST 25 07/20/2021   ALKPHOS 100 07/20/2021   BILITOT 0.7 07/20/2021   Prediabetes: Negative for polydipsia, polyphagia, and polyuria. She follows a healthful diet and exercise regularly. Lab Results  Component Value Date   HGBA1C 6.1 02/13/2022   Review of Systems  Constitutional:  Negative for fever and weight loss.  HENT:  Positive for tinnitus (chronic). Negative for ear pain, hearing loss, rhinorrhea, sinus pressure and sore throat.   Eyes:  Negative for photophobia.  Respiratory:  Negative for cough.   Gastrointestinal:  Negative for abdominal pain, anorexia, nausea and vomiting.  Musculoskeletal:  Negative for back pain and neck pain.  Neurological:  Positive for headaches. Negative for dizziness, tingling, seizures, weakness and loss of balance.  Psychiatric/Behavioral:  The patient does not have insomnia.   See other pertinent positives and negatives in HPI.  Current Outpatient Medications on File Prior to Visit  Medication Sig Dispense  Refill   aspirin EC 81 MG tablet Take 81 mg by mouth daily.     azelastine (ASTELIN) 0.1 % nasal spray Place 1 spray into both nostrils 2 (two) times daily. Use in each nostril as  directed 30 mL 6   carbamide peroxide (DEBROX) 6.5 % OTIC solution Place 5 drops into the left ear 2 (two) times daily. 15 mL 0   Cholecalciferol (VITAMIN D3) 25 MCG (1000 UT) CAPS Take 1 capsule by mouth 3 (three) times a week.     enalapril (VASOTEC) 20 MG tablet Take 1 tablet by mouth once daily 90 tablet 1   lovastatin (MEVACOR) 40 MG tablet Take 1 tablet (40 mg total) by mouth at bedtime. 90 tablet 2   pantoprazole (PROTONIX) 20 MG tablet Take 1 tablet by mouth once daily 90 tablet 3   sodium fluoride (FLUORISHIELD) 1.1 % GEL dental gel SF 5000 Plus 1.1 % dental cream  BRUSH THOROUGHLY ONCE DAILY AT BEDTIME     verapamil (CALAN) 120 MG tablet Take 1 tablet by mouth once daily 90 tablet 1   vitamin C (ASCORBIC ACID) 500 MG tablet Take 500 mg by mouth daily.     vitamin E 400 UNIT capsule Take 400 Units by mouth daily.     No current facility-administered medications on file prior to visit.    Past Medical History:  Diagnosis Date   Chicken pox    Hypercholesteremia    Hypertension    Keratoconus    Left   Allergies  Allergen Reactions   Codeine Itching    Social History   Socioeconomic History   Marital status: Married    Spouse name: Renae Fickle   Number of children: 0   Years of education: Not on file   Highest education level: Bachelor's degree (e.g., BA, AB, BS)  Occupational History   Occupation: Educator  Tobacco Use   Smoking status: Never   Smokeless tobacco: Never  Substance and Sexual Activity   Alcohol use: No   Drug use: No   Sexual activity: Not on file  Other Topics Concern   Not on file  Social History Narrative   Married, lives with husband Renae Fickle in a 2 story home. Drinks 1/2 cup of coffee a day, rare soda or tea. Exercise regularly for at least 60 minutes.   Social Determinants of Health   Financial Resource Strain: Low Risk  (06/28/2022)   Overall Financial Resource Strain (CARDIA)    Difficulty of Paying Living Expenses: Not hard at all  Food  Insecurity: No Food Insecurity (08/18/2022)   Hunger Vital Sign    Worried About Running Out of Food in the Last Year: Never true    Ran Out of Food in the Last Year: Never true  Transportation Needs: No Transportation Needs (06/28/2022)   PRAPARE - Administrator, Civil Service (Medical): No    Lack of Transportation (Non-Medical): No  Physical Activity: Sufficiently Active (06/28/2022)   Exercise Vital Sign    Days of Exercise per Week: 4 days    Minutes of Exercise per Session: 60 min  Stress: No Stress Concern Present (06/28/2022)   [employer] of Occupational Health - Occupational Stress Questionnaire    Feeling of Stress : Not at all  Social Connections: Socially Integrated (06/28/2022)   Social Connection and Isolation Panel [NHANES]    Frequency of Communication with Friends and Family: More than three times a week    Frequency of Social Gatherings with Friends and  Family: More than three times a week    Attends Religious Services: More than 4 times per year    Active Member of Clubs or Organizations: Yes    Attends Banker Meetings: More than 4 times per year    Marital Status: Married    Vitals:   10/06/22 1125  BP: 130/82  Pulse: 65  Resp: 16  Temp: 98.1 F (36.7 C)  SpO2: 97%   Body mass index is 26.95 kg/m.  Physical Exam Vitals and nursing note reviewed.  Constitutional:      General: She is not in acute distress.    Appearance: She is well-developed.  HENT:     Head: Normocephalic and atraumatic.     Mouth/Throat:     Mouth: Mucous membranes are moist.     Pharynx: Oropharynx is clear.  Eyes:     Comments: Right conjunctiva is not injected. No exudate or hemorrhage. Left cornea opaque/hazy.   Cardiovascular:     Rate and Rhythm: Normal rate and regular rhythm.     Heart sounds: No murmur heard. Pulmonary:     Effort: Pulmonary effort is normal. No respiratory distress.     Breath sounds: Normal breath sounds.  Abdominal:      Palpations: Abdomen is soft. There is no hepatomegaly or mass.     Tenderness: There is no abdominal tenderness.  Musculoskeletal:     Right lower leg: No edema.     Left lower leg: No edema.  Lymphadenopathy:     Cervical: No cervical adenopathy.  Skin:    General: Skin is warm.     Findings: No erythema or rash.  Neurological:     General: No focal deficit present.     Mental Status: She is alert and oriented to person, place, and time.     Motor: Tremor (L>R hand , mild, not present at rest.) present. No pronator drift.     Gait: Gait normal.     Deep Tendon Reflexes:     Reflex Scores:      Patellar reflexes are 2+ on the right side and 2+ on the left side. Psychiatric:        Mood and Affect: Mood and affect normal.   ASSESSMENT AND PLAN:  [name] was seen today for new onset headache and f/u. Lab Results  Component Value Date   NA 140 10/06/2022   CL 104 10/06/2022   K 4.0 10/06/2022   CO2 28 10/06/2022   BUN 20 10/06/2022   CREATININE 1.10 10/06/2022   GFR 53.91 (L) 10/06/2022   CALCIUM 9.8 10/06/2022   ALBUMIN 4.3 10/06/2022   GLUCOSE 83 10/06/2022   Lab Results  Component Value Date   HGBA1C 5.6 10/06/2022   Lab Results  Component Value Date   ALT 17 10/06/2022   AST 23 10/06/2022   ALKPHOS 109 10/06/2022   BILITOT 0.5 10/06/2022   New onset headache Headache has greatly improved but given the fact it was a new problem for her that was severe enough to affect her daily activities during the time she had the pain + hx of tremor, I think it is appropriate to obtain head imaging.  Possible etiologies discussed. Brain MRI will be arranged. She was clearly instructed about warning signs.  -     MR BRAIN WO CONTRAST; Future  Prediabetes Assessment & Plan: Last hemoglobin A1c 6.1 in 02/2022. Encouraged to continue a healthy lifestyle for diabetes prevention. Further recommendation will be given according  to hemoglobin A1c result.  Orders: -      Hemoglobin A1c; Future  Primary hypertension Assessment & Plan: She reports mildly elevated BPs at home during the time she was having headache, it is back to baseline. For now continue enalapril 20 mg daily and verapamil 120 mg daily. Recommend monitoring BP at home and to let me know what her readings in about 2 weeks. Continue low-salt diet. Eye exam is current.  Orders: -     Hepatic function panel; Future  Tremor, unspecified Assessment & Plan: We discussed possible etiologies, history and examination suggest essential tremor.  We discussed diagnosis, prognosis, and treatment options. I do not think neurology evaluation is needed at this time but it was still offered, we decided to hold on it. Continue monitoring for new symptoms. For now she is not interested in pharmacologic treatment.  Orders: -     Basic metabolic panel; Future -     MR BRAIN WO CONTRAST; Future -     Hepatic function panel; Future  Return in about 6 months (around 04/08/2023) for chronic problems.  Tasmine Hipwell G. [place], MD  [facility]. [place] office.

## 2022-10-06 NOTE — Patient Instructions (Addendum)
A few things to remember from today's visit:  Primary hypertension  Tremor, unspecified - Plan: Basic metabolic panel, MR Brain Wo Contrast  New onset headache - Plan: MR Brain Wo Contrast  Prediabetes - Plan: Hemoglobin A1c  Monitor blood pressure at home, 3 times in the morning and 2 times at night, let me know about readings in 2 weeks. No changes today. Brain MRI will be arranged.  If you need refills for medications you take chronically, please call your pharmacy. Do not use My Chart to request refills or for acute issues that need immediate attention. If you send a my chart message, it may take a few days to be addressed, specially if I am not in the office.  Please be sure medication list is accurate. If a new problem present, please set up appointment sooner than planned today.

## 2022-10-17 ENCOUNTER — Ambulatory Visit (HOSPITAL_COMMUNITY): Payer: PPO | Attending: Cardiovascular Disease

## 2022-10-17 DIAGNOSIS — I447 Left bundle-branch block, unspecified: Secondary | ICD-10-CM

## 2022-10-17 DIAGNOSIS — E785 Hyperlipidemia, unspecified: Secondary | ICD-10-CM

## 2022-10-17 DIAGNOSIS — I10 Essential (primary) hypertension: Secondary | ICD-10-CM

## 2022-10-17 DIAGNOSIS — I420 Dilated cardiomyopathy: Secondary | ICD-10-CM | POA: Diagnosis not present

## 2022-10-17 LAB — ECHOCARDIOGRAM COMPLETE
Area-P 1/2: 3.12 cm2
S' Lateral: 2.7 cm

## 2022-10-20 ENCOUNTER — Encounter: Payer: Self-pay | Admitting: Family Medicine

## 2022-10-30 ENCOUNTER — Encounter: Payer: Self-pay | Admitting: Family Medicine

## 2022-11-01 NOTE — Telephone Encounter (Signed)
See other mychart encounter.

## 2022-11-07 ENCOUNTER — Other Ambulatory Visit: Payer: Self-pay | Admitting: Family Medicine

## 2022-11-24 ENCOUNTER — Other Ambulatory Visit: Payer: PPO

## 2022-12-12 ENCOUNTER — Telehealth: Payer: Self-pay | Admitting: Family Medicine

## 2022-12-12 NOTE — Telephone Encounter (Signed)
Pt has MRI of brain sch for Friday and pt is no long having headaches and would like to know if its ok to cancel

## 2022-12-13 NOTE — Telephone Encounter (Signed)
I called and spoke with patient. She is aware PCP will leave it up to her if she wants to cancel it, but wouldn't hurt to have it done to make sure everything has resolved.

## 2022-12-15 ENCOUNTER — Ambulatory Visit
Admission: RE | Admit: 2022-12-15 | Discharge: 2022-12-15 | Disposition: A | Discharge status: Home / Self Care | Payer: PPO | Source: Ambulatory Visit | Attending: Family Medicine | Admitting: Family Medicine

## 2022-12-15 ENCOUNTER — Ambulatory Visit (INDEPENDENT_AMBULATORY_CARE_PROVIDER_SITE_OTHER): Payer: PPO

## 2022-12-15 DIAGNOSIS — R251 Tremor, unspecified: Secondary | ICD-10-CM

## 2022-12-15 DIAGNOSIS — H539 Unspecified visual disturbance: Secondary | ICD-10-CM | POA: Diagnosis not present

## 2022-12-15 DIAGNOSIS — R519 Headache, unspecified: Secondary | ICD-10-CM | POA: Diagnosis not present

## 2022-12-15 DIAGNOSIS — Z23 Encounter for immunization: Secondary | ICD-10-CM

## 2022-12-28 ENCOUNTER — Telehealth: Payer: Self-pay | Admitting: Family Medicine

## 2022-12-28 NOTE — Telephone Encounter (Signed)
Pt had MR Brain Wo Contrast  on 12/15/22. Pt would like a call back to go over her results.  Pt informed MD & CMA are OOO on Thursdays.

## 2022-12-29 NOTE — Telephone Encounter (Signed)
I spoke with reading room, they should be reading now.

## 2022-12-29 NOTE — Telephone Encounter (Signed)
See result note.  

## 2023-01-28 ENCOUNTER — Other Ambulatory Visit: Payer: Self-pay | Admitting: Family Medicine

## 2023-01-28 DIAGNOSIS — I10 Essential (primary) hypertension: Secondary | ICD-10-CM

## 2023-01-29 ENCOUNTER — Telehealth: Payer: Self-pay | Admitting: Family Medicine

## 2023-01-29 DIAGNOSIS — I10 Essential (primary) hypertension: Secondary | ICD-10-CM

## 2023-01-29 MED ORDER — ENALAPRIL MALEATE 20 MG PO TABS: 20.0000 mg | ORAL_TABLET | Freq: Every day | ORAL | 1 refills | Status: DC

## 2023-01-29 NOTE — Telephone Encounter (Signed)
Prescription Request  01/29/2023  LOV: 10/06/2022  What is the name of the medication or equipment? enalapril (VASOTEC) 20 MG tablet   Have you contacted your pharmacy to request a refill? No   Which pharmacy would you like this sent to?  [facility] [address] [place] - [address] [place] [address] Phone: [phone] Fax: [phone]    Patient notified that their request is being sent to the clinical staff for review and that they should receive a response within 2 business days.   Please advise at Mobile [phone] (mobile)

## 2023-01-29 NOTE — Telephone Encounter (Signed)
Rx sent 

## 2023-01-30 MED ORDER — ENALAPRIL MALEATE 20 MG PO TABS
ORAL_TABLET | ORAL | 2 refills | Status: DC
Start: 2023-01-30 — End: 2023-07-23

## 2023-01-30 NOTE — Addendum Note (Signed)
Addended by: Weyman Croon E on: 01/30/2023 12:51 PM   Modules accepted: Orders

## 2023-01-30 NOTE — Telephone Encounter (Signed)
Rx updated & re-sent. Pt is taking 1 and 1/2 tablets per pcp notes.

## 2023-02-15 DIAGNOSIS — Z1231 Encounter for screening mammogram for malignant neoplasm of breast: Secondary | ICD-10-CM | POA: Diagnosis not present

## 2023-02-15 DIAGNOSIS — R87610 Atypical squamous cells of undetermined significance on cytologic smear of cervix (ASC-US): Secondary | ICD-10-CM | POA: Diagnosis not present

## 2023-02-15 DIAGNOSIS — Z01419 Encounter for gynecological examination (general) (routine) without abnormal findings: Secondary | ICD-10-CM | POA: Diagnosis not present

## 2023-02-20 LAB — HM PAP SMEAR
HM Pap smear: NEGATIVE
HPV, high-risk: NEGATIVE

## 2023-02-22 ENCOUNTER — Other Ambulatory Visit: Payer: Self-pay | Admitting: Family Medicine

## 2023-02-22 DIAGNOSIS — I10 Essential (primary) hypertension: Secondary | ICD-10-CM

## 2023-04-06 NOTE — Progress Notes (Signed)
HPI: [name] is a 63 y.o. female with a PMHx significant for prediabetes, HLD, HTN, LBBB, insomnia, prediabetes, and NASH, who is here today for her routine physical.  Last CPE: 02/13/2022  Exercise: Diet:  Sleep:  Alcohol Use:  Smoking: Vision:  Dental:  Immunization History  Administered Date(s) Administered   Hep A / Hep B 11/29/2016   Influenza Inj Mdck Quad Pf 01/15/2022   Influenza, Seasonal, Injecte, Preservative Fre 12/23/2014, 12/15/2022   Influenza,inj,Quad PF,6+ Mos 11/29/2016, 01/02/2018, 11/26/2018, 11/26/2019, 01/12/2021   Influenza-Unspecified 12/23/2014   PFIZER(Purple Top)SARS-COV-2 Vaccination 05/22/2019, 06/16/2019   PPD Test 11/29/2016   Pfizer Covid-19 Vaccine Bivalent Booster 52yrs & up 12/17/2020   Tdap 02/09/2012   Unspecified SARS-COV-2 Vaccination 01/15/2022   Health Maintenance  Topic Date Due   Zoster Vaccines- Shingrix (1 of 2) Never done   DTaP/Tdap/Td (2 - Td or Tdap) 02/08/2022   COVID-19 Vaccine (5 - 2024-25 season) 11/12/2022   Medicare Annual Wellness (AWV)  06/28/2023   MAMMOGRAM  01/28/2024   Colonoscopy  10/04/2025   Cervical Cancer Screening (HPV/Pap Cotest)  02/20/2028   INFLUENZA VACCINE  Completed   Hepatitis C Screening  Completed   HIV Screening  Completed   HPV VACCINES  Aged Out   Chronic medical problems: ***  Concerns today: ***  Review of Systems  Current Outpatient Medications on File Prior to Visit  Medication Sig Dispense Refill   aspirin EC 81 MG tablet Take 81 mg by mouth daily.     azelastine (ASTELIN) 0.1 % nasal spray Place 1 spray into both nostrils 2 (two) times daily. Use in each nostril as directed 30 mL 6   carbamide peroxide (DEBROX) 6.5 % OTIC solution Place 5 drops into the left ear 2 (two) times daily. 15 mL 0   Cholecalciferol (VITAMIN D3) 25 MCG (1000 UT) CAPS Take 1 capsule by mouth 3 (three) times a week.     enalapril (VASOTEC) 20 MG tablet Take 1 tablet in the morning and 1/2  tablet in the evening. 135 tablet 2   lovastatin (MEVACOR) 40 MG tablet TAKE 1 TABLET BY MOUTH AT BEDTIME 90 tablet 2   pantoprazole (PROTONIX) 20 MG tablet Take 1 tablet by mouth once daily 90 tablet 3   sodium fluoride (FLUORISHIELD) 1.1 % GEL dental gel SF 5000 Plus 1.1 % dental cream  BRUSH THOROUGHLY ONCE DAILY AT BEDTIME     verapamil (CALAN) 120 MG tablet Take 1 tablet by mouth once daily 90 tablet 1   vitamin C (ASCORBIC ACID) 500 MG tablet Take 500 mg by mouth daily.     vitamin E 400 UNIT capsule Take 400 Units by mouth daily.     No current facility-administered medications on file prior to visit.    Past Medical History:  Diagnosis Date   Chicken pox    Hypercholesteremia    Hypertension    Keratoconus    Left    Past Surgical History:  Procedure Laterality Date   BREAST SURGERY     biopsy   CORNEAL TRANSPLANT      Allergies  Allergen Reactions   Codeine Itching    Family History  Problem Relation Age of Onset   Hyperlipidemia Mother    Heart disease Mother    Stroke Mother    Hypertension Mother    Diabetes Mother    Hyperlipidemia Father    Heart disease Father    Stroke Father    Hypertension Father    Diabetes  Father     Social History   Socioeconomic History   Marital status: Married    Spouse name: Renae Fickle   Number of children: 0   Years of education: Not on file   Highest education level: Bachelor's degree (e.g., BA, AB, BS)  Occupational History   Occupation: Educator  Tobacco Use   Smoking status: Never   Smokeless tobacco: Never  Substance and Sexual Activity   Alcohol use: No   Drug use: No   Sexual activity: Not on file  Other Topics Concern   Not on file  Social History Narrative   Married, lives with husband Renae Fickle in a 2 story home. Drinks 1/2 cup of coffee a day, rare soda or tea. Exercise regularly for at least 60 minutes.   Social Drivers of Corporate investment banker Strain: Low Risk  (06/28/2022)   Overall Financial  Resource Strain (CARDIA)    Difficulty of Paying Living Expenses: Not hard at all  Food Insecurity: No Food Insecurity (08/18/2022)   Hunger Vital Sign    Worried About Running Out of Food in the Last Year: Never true    Ran Out of Food in the Last Year: Never true  Transportation Needs: No Transportation Needs (06/28/2022)   PRAPARE - Administrator, Civil Service (Medical): No    Lack of Transportation (Non-Medical): No  Physical Activity: Sufficiently Active (06/28/2022)   Exercise Vital Sign    Days of Exercise per Week: 4 days    Minutes of Exercise per Session: 60 min  Stress: No Stress Concern Present (06/28/2022)   [employer] of Occupational Health - Occupational Stress Questionnaire    Feeling of Stress : Not at all  Social Connections: Socially Integrated (06/28/2022)   Social Connection and Isolation Panel [NHANES]    Frequency of Communication with Friends and Family: More than three times a week    Frequency of Social Gatherings with Friends and Family: More than three times a week    Attends Religious Services: More than 4 times per year    Active Member of [employer] or Organizations: Yes    Attends Engineer, structural: More than 4 times per year    Marital Status: Married    There were no vitals filed for this visit. There is no height or weight on file to calculate BMI.  Wt Readings from Last 3 Encounters:  10/06/22 152 lb 2 oz (69 kg)  09/21/22 147 lb 3.2 oz (66.8 kg)  08/29/22 152 lb (68.9 kg)    Physical Exam  ASSESSMENT AND PLAN:  Latoya Thompson was here today for her annual physical examination.  No orders of the defined types were placed in this encounter.   @ASSESSPLAN @  There are no diagnoses linked to this encounter.  No follow-ups on file.  I, Rolla Etienne Wierda, acting as a scribe for Shaneisha Burkel [place], MD., have documented all relevant documentation on the behalf of Hershel Corkery [place], MD, as directed by  Aloise Copus [place], MD while  in the presence of Medardo Hassing [place], MD.   I, Kirandeep Fariss [place], MD, have reviewed all documentation for this visit. The documentation on 04/06/23 for the exam, diagnosis, procedures, and orders are all accurate and complete.  Lakya Schrupp G. [place], MD  [facility]. [place] office.

## 2023-04-09 ENCOUNTER — Ambulatory Visit (INDEPENDENT_AMBULATORY_CARE_PROVIDER_SITE_OTHER): Payer: PPO | Admitting: Family Medicine

## 2023-04-09 ENCOUNTER — Encounter: Payer: Self-pay | Admitting: Family Medicine

## 2023-04-09 VITALS — BP 120/80 | HR 80 | Resp 16 | Ht 63.0 in | Wt 154.1 lb

## 2023-04-09 DIAGNOSIS — Z0000 Encounter for general adult medical examination without abnormal findings: Secondary | ICD-10-CM

## 2023-04-09 DIAGNOSIS — E785 Hyperlipidemia, unspecified: Secondary | ICD-10-CM

## 2023-04-09 DIAGNOSIS — R7303 Prediabetes: Secondary | ICD-10-CM

## 2023-04-09 DIAGNOSIS — Z23 Encounter for immunization: Secondary | ICD-10-CM | POA: Diagnosis not present

## 2023-04-09 LAB — LIPID PANEL
Cholesterol: 205 mg/dL — ABNORMAL HIGH (ref 0–200)
HDL: 55.8 mg/dL (ref >39.00)
LDL Cholesterol: 134 mg/dL — ABNORMAL HIGH (ref 0–99)
NonHDL: 149.56
Total CHOL/HDL Ratio: 4
Triglycerides: 79 mg/dL (ref 0.0–149.0)
VLDL: 15.8 mg/dL (ref 0.0–40.0)

## 2023-04-09 LAB — COMPREHENSIVE METABOLIC PANEL
ALT: 16 U/L (ref 0–35)
AST: 27 U/L (ref 0–37)
Albumin: 4.4 g/dL (ref 3.5–5.2)
Alkaline Phosphatase: 103 U/L (ref 39–117)
BUN: 21 mg/dL (ref 6–23)
CO2: 28 meq/L (ref 19–32)
Calcium: 9.3 mg/dL (ref 8.4–10.5)
Chloride: 108 meq/L (ref 96–112)
Creatinine, Ser: 1.02 mg/dL (ref 0.40–1.20)
GFR: 58.82 mL/min — ABNORMAL LOW (ref >60.00)
Glucose, Bld: 86 mg/dL (ref 70–99)
Potassium: 3.9 meq/L (ref 3.5–5.1)
Sodium: 143 meq/L (ref 135–145)
Total Bilirubin: 0.5 mg/dL (ref 0.2–1.2)
Total Protein: 7 g/dL (ref 6.0–8.3)

## 2023-04-09 LAB — HEMOGLOBIN A1C: Hgb A1c MFr Bld: 6 % (ref 4.6–6.5)

## 2023-04-09 NOTE — Assessment & Plan Note (Signed)
Continue lovastatin 40 mg daily and low-fat diet. Further recommendation will be given according to lipid panel result.

## 2023-04-09 NOTE — Assessment & Plan Note (Signed)
We discussed the importance of regular physical activity and healthy diet for prevention of chronic illness and/or complications. Preventive guidelines reviewed. Vaccination updated today, Prevnar 20 given. Continue female preventive care with gynecologist. Ca++ and vit D supplementation to continue. Next CPE in a year.

## 2023-04-09 NOTE — Patient Instructions (Addendum)
A few things to remember from today's visit:  Routine general medical examination at a health care facility  Hyperlipidemia, unspecified hyperlipidemia type - Plan: Comprehensive metabolic panel, Lipid panel  Prediabetes - Plan: Hemoglobin A1c  Monitor blood pressure and let me know readings in 2 weeks, it should be under 130/80.  If you need refills for medications you take chronically, please call your pharmacy. Do not use My Chart to request refills or for acute issues that need immediate attention. If you send a my chart message, it may take a few days to be addressed, specially if I am not in the office.  Please be sure medication list is accurate. If a new problem present, please set up appointment sooner than planned today.  Health Maintenance, Female Adopting a healthy lifestyle and getting preventive care are important in promoting health and wellness. Ask your health care provider about: The right schedule for you to have regular tests and exams. Things you can do on your own to prevent diseases and keep yourself healthy. What should I know about diet, weight, and exercise? Eat a healthy diet  Eat a diet that includes plenty of vegetables, fruits, low-fat dairy products, and lean protein. Do not eat a lot of foods that are high in solid fats, added sugars, or sodium. Maintain a healthy weight Body mass index (BMI) is used to identify weight problems. It estimates body fat based on height and weight. Your health care provider can help determine your BMI and help you achieve or maintain a healthy weight. Get regular exercise Get regular exercise. This is one of the most important things you can do for your health. Most adults should: Exercise for at least 150 minutes each week. The exercise should increase your heart rate and make you sweat (moderate-intensity exercise). Do strengthening exercises at least twice a week. This is in addition to the moderate-intensity  exercise. Spend less time sitting. Even light physical activity can be beneficial. Watch cholesterol and blood lipids Have your blood tested for lipids and cholesterol at 63 years of age, then have this test every 5 years. Have your cholesterol levels checked more often if: Your lipid or cholesterol levels are high. You are older than 63 years of age. You are at high risk for heart disease. What should I know about cancer screening? Depending on your health history and family history, you may need to have cancer screening at various ages. This may include screening for: Breast cancer. Cervical cancer. Colorectal cancer. Skin cancer. Lung cancer. What should I know about heart disease, diabetes, and high blood pressure? Blood pressure and heart disease High blood pressure causes heart disease and increases the risk of stroke. This is more likely to develop in people who have high blood pressure readings or are overweight. Have your blood pressure checked: Every 3-5 years if you are 27-40 years of age. Every year if you are 35 years old or older. Diabetes Have regular diabetes screenings. This checks your fasting blood sugar level. Have the screening done: Once every three years after age 32 if you are at a normal weight and have a low risk for diabetes. More often and at a younger age if you are overweight or have a high risk for diabetes. What should I know about preventing infection? Hepatitis B If you have a higher risk for hepatitis B, you should be screened for this virus. Talk with your health care provider to find out if you are at risk for hepatitis  B infection. Hepatitis C Testing is recommended for: Everyone born from [birth year] through 1965. Anyone with known risk factors for hepatitis C. Sexually transmitted infections (STIs) Get screened for STIs, including gonorrhea and chlamydia, if: You are sexually active and are younger than 63 years of age. You are older than 63 years  of age and your health care provider tells you that you are at risk for this type of infection. Your sexual activity has changed since you were last screened, and you are at increased risk for chlamydia or gonorrhea. Ask your health care provider if you are at risk. Ask your health care provider about whether you are at high risk for HIV. Your health care provider may recommend a prescription medicine to help prevent HIV infection. If you choose to take medicine to prevent HIV, you should first get tested for HIV. You should then be tested every 3 months for as long as you are taking the medicine. Pregnancy If you are about to stop having your period (premenopausal) and you may become pregnant, seek counseling before you get pregnant. Take 400 to 800 micrograms (mcg) of folic acid every day if you become pregnant. Ask for birth control (contraception) if you want to prevent pregnancy. Osteoporosis and menopause Osteoporosis is a disease in which the bones lose minerals and strength with aging. This can result in bone fractures. If you are 51 years old or older, or if you are at risk for osteoporosis and fractures, ask your health care provider if you should: Be screened for bone loss. Take a calcium or vitamin D supplement to lower your risk of fractures. Be given hormone replacement therapy (HRT) to treat symptoms of menopause. Follow these instructions at home: Alcohol use Do not drink alcohol if: Your health care provider tells you not to drink. You are pregnant, may be pregnant, or are planning to become pregnant. If you drink alcohol: Limit how much you have to: 0-1 drink a day. Know how much alcohol is in your drink. In the [place] one drink equals one 12 oz bottle of beer (355 mL), one 5 oz glass of wine (148 mL), or one 1 oz glass of hard liquor (44 mL). Lifestyle Do not use any products that contain nicotine or tobacco. These products include cigarettes, chewing tobacco, and vaping  devices, such as e-cigarettes. If you need help quitting, ask your health care provider. Do not use street drugs. Do not share needles. Ask your health care provider for help if you need support or information about quitting drugs. General instructions Schedule regular health, dental, and eye exams. Stay current with your vaccines. Tell your health care provider if: You often feel depressed. You have ever been abused or do not feel safe at home. Summary Adopting a healthy lifestyle and getting preventive care are important in promoting health and wellness. Follow your health care provider's instructions about healthy diet, exercising, and getting tested or screened for diseases. Follow your health care provider's instructions on monitoring your cholesterol and blood pressure. This information is not intended to replace advice given to you by your health care provider. Make sure you discuss any questions you have with your health care provider. Document Revised: 07/19/2020 Document Reviewed: 07/19/2020 Elsevier Patient Education  2024 [employer].

## 2023-04-09 NOTE — Assessment & Plan Note (Signed)
Last hemoglobin A1c was improved, 5.6 in 09/2022. Encouraged consistency with a healthy lifestyle for diabetes prevention. Further recommendation will be given according to hemoglobin A1c result.

## 2023-05-30 ENCOUNTER — Ambulatory Visit
Admission: EM | Admit: 2023-05-30 | Discharge: 2023-05-30 | Disposition: A | Discharge status: Home / Self Care | Attending: Internal Medicine | Admitting: Internal Medicine

## 2023-05-30 ENCOUNTER — Ambulatory Visit (HOSPITAL_BASED_OUTPATIENT_CLINIC_OR_DEPARTMENT_OTHER)
Admission: RE | Admit: 2023-05-30 | Discharge: 2023-05-30 | Disposition: A | Discharge status: Home / Self Care | Source: Ambulatory Visit | Attending: Internal Medicine | Admitting: Internal Medicine

## 2023-05-30 DIAGNOSIS — S9031XA Contusion of right foot, initial encounter: Secondary | ICD-10-CM

## 2023-05-30 DIAGNOSIS — M2011 Hallux valgus (acquired), right foot: Secondary | ICD-10-CM | POA: Diagnosis not present

## 2023-05-30 MED ORDER — IBUPROFEN 600 MG PO TABS: 600.0000 mg | ORAL_TABLET | Freq: Three times a day (TID) | ORAL | 0 refills | Status: DC | PRN

## 2023-05-30 NOTE — Discharge Instructions (Addendum)
 If there is no fracture, you may wear the special brace for 2 days then try regular shoe as tolerated Ice area of pain for 20 minutes 3 times a day for  2 days  No exercising if it is broken We will call you when the xray is back   Go to [facility] to get the xray done [address], [place], [place] [address] Hours:  Open 24 hours Emergency room: Open 24 hours  More hours Confirmed by this business 9 weeks ago Phone: [phone]

## 2023-05-30 NOTE — ED Triage Notes (Signed)
 Pt reports pain in right foot after she dropped 40lbs dumbbell at the gym today.

## 2023-05-30 NOTE — ED Provider Notes (Signed)
 [facility]    CSN: [id number] Arrival date & time: 05/30/23  1443      History   Chief Complaint Chief Complaint  Patient presents with   Foot Pain    HPI Latoya Thompson is a 63 y.o. female presents with R foot pain after she dropped a 40 lbs dumbbell at the gym today. She continued working out since this area was just a little sore. But after 3 hours she noticed herself limping and has more pain.     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   Routine general medical examination at a health care facility 02/13/2022   Dermatitis of face 02/13/2022   Stress incontinence in female 07/12/2020   Prediabetes 07/12/2020   Cardiomyopathy, unspecified type (HCC) 05/29/2019   GERD (gastroesophageal reflux disease) 07/03/2018   Insomnia 07/03/2018   Tremor, unspecified 01/02/2018   Tinnitus, right ear 01/02/2018   NASH (nonalcoholic steatohepatitis) 05/18/2016   Overweight (BMI 25.0-29.9) 05/18/2016   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      Home Medications    Prior to Admission medications   Medication Sig Start Date End Date Taking? Authorizing Provider  ibuprofen (ADVIL) 600 MG tablet Take 1 tablet (600 mg total) by mouth every 8 (eight) hours as needed. 05/30/23  Yes Rodriguez-Southworth, Viviana Simpler  aspirin EC 81 MG tablet Take 81 mg by mouth daily.    [provider]  azelastine (ASTELIN) 0.1 % nasal spray Place 1 spray into both nostrils 2 (two) times  daily. Use in each nostril as directed 08/29/22   [place], Betty G, MD  carbamide peroxide (DEBROX) 6.5 % OTIC solution Place 5 drops into the left ear 2 (two) times daily. 08/29/22   [place], Betty G, MD  Cholecalciferol (VITAMIN D3) 25 MCG (1000 UT) CAPS Take 1 capsule by mouth 3 (three) times a week.    [provider]  enalapril (VASOTEC) 20 MG tablet Take 1 tablet in the morning and 1/2 tablet in the evening. 01/30/23   [place], Betty G, MD  lovastatin (MEVACOR) 40 MG tablet TAKE 1 TABLET BY MOUTH AT BEDTIME 11/07/22   [place], Betty G, MD  pantoprazole (PROTONIX) 20 MG tablet Take 1 tablet by mouth once daily 02/22/21   [place], Betty G, MD  sodium fluoride (FLUORISHIELD) 1.1 % GEL dental gel SF 5000 Plus 1.1 % dental cream  BRUSH THOROUGHLY ONCE DAILY AT BEDTIME    [provider]  verapamil (CALAN) 120 MG tablet Take 1 tablet by mouth once daily 02/23/23   [place], Betty G, MD  vitamin C (ASCORBIC ACID) 500 MG tablet Take 500 mg by mouth daily.    [provider]  vitamin E 400 UNIT capsule Take 400 Units by mouth daily.    [provider]    Family History Family History  Problem Relation Age of Onset   Hyperlipidemia Mother  Heart disease Mother    Stroke Mother    Hypertension Mother    Diabetes Mother    Hyperlipidemia Father    Heart disease Father    Stroke Father    Hypertension Father    Diabetes Father     Social History Social History   Tobacco Use   Smoking status: Never   Smokeless tobacco: Never  Vaping Use   Vaping status: Never Used  Substance Use Topics   Alcohol use: No   Drug use: No     Allergies   Codeine   Review of Systems Review of Systems As noted in HPI  Physical Exam Triage Vital Signs ED Triage Vitals [05/30/23 1451]  Encounter Vitals Group     BP 128/77     Systolic BP Percentile      Diastolic BP Percentile      Pulse Rate 85     Resp 18     Temp 98.7 F (37.1 C)     Temp Source Oral      SpO2 96 %     Weight      Height      Head Circumference      Peak Flow      Pain Score      Pain Loc      Pain Education      Exclude from Growth Chart    No data found.  Updated Vital Signs BP 128/77 (BP Location: Right Arm)   Pulse 85   Temp 98.7 F (37.1 C) (Oral)   Resp 18   SpO2 96%   Visual Acuity Right Eye Distance:   Left Eye Distance:   Bilateral Distance:    Right Eye Near:   Left Eye Near:    Bilateral Near:     Physical Exam Vitals and nursing note reviewed.  HENT:     Right Ear: External ear normal.  Eyes:     General: No scleral icterus.    Conjunctiva/sclera: Conjunctivae normal.  Pulmonary:     Effort: Pulmonary effort is normal.  Musculoskeletal:        General: Normal range of motion.     Cervical back: Neck supple.     Comments: R FOOT- no ecchymosis or swelling. Has tenderness on 1ns and 2nd metatarsal region. Toe ROM is normal  ANKLE- normal.   Skin:    General: Skin is warm and dry.     Findings: No bruising, erythema or rash.  Neurological:     Mental Status: She is alert and oriented to person, place, and time.  Psychiatric:        Mood and Affect: Mood normal.        Behavior: Behavior normal.        Thought Content: Thought content normal.        Judgment: Judgment normal.      UC Treatments / Results  Labs (all labs ordered are listed, but only abnormal results are displayed) Labs Reviewed - No data to display  EKG   Radiology DG Foot Complete Right Result Date: 05/30/2023 CLINICAL DATA:  40 pound dumbbell and top of right foot. Tender first and second metatarsals. EXAM: RIGHT FOOT COMPLETE - 3+ VIEW COMPARISON:  None Available. FINDINGS: Borderline mild hallux valgus. Mild chronic enthesopathic change at the Achilles insertion on the calcaneus. Joint spaces are preserved. No acute fracture or dislocation. IMPRESSION: 1. No acute fracture. 2. Borderline mild hallux valgus. 3. Mild chronic enthesopathic change at the  Achilles insertion on the calcaneus. Electronically Signed   By: Neita Garnet M.D.   On: 05/30/2023 17:38    Procedures Procedures (including critical care time)  Medications Ordered in UC Medications - No data to display  Initial Impression / Assessment and Plan / UC Course  I have reviewed the triage vital signs and the nursing notes.  Pertinent  imaging results that were available during my care of the patient were reviewed by me and considered in my medical decision making (see chart for details).   Contusion R foot  She was placed no a post op shoe. I sent Ibuprofen to the pharmacy as noted for pain.  See instructions.  Final Clinical Impressions(s) / UC Diagnoses   Final diagnoses:  Contusion of right foot, initial encounter     Discharge Instructions      If there is no fracture, you may wear the special brace for 2 days then try regular shoe as tolerated Ice area of pain for 20 minutes 3 times a day for  2 days  No exercising if it is broken We will call you when the xray is back   Go to [facility] to get the xray done [address], [place], [place] [address] Hours:  Open 24 hours Emergency room: Open 24 hours  More hours Confirmed by this business 9 weeks ago Phone: [phone]     ED Prescriptions     Medication Sig Dispense Auth. Provider   ibuprofen (ADVIL) 600 MG tablet Take 1 tablet (600 mg total) by mouth every 8 (eight) hours as needed. 30 tablet Rodriguez-Southworth, Nettie Elm, PA-C      PDMP not reviewed this encounter.   Garey Ham, Cordelia Poche 05/30/23 1936

## 2023-06-01 NOTE — Progress Notes (Signed)
 No acute fracture or dislocation noted on imaging. No changes to management plan recommended at this time.

## 2023-06-17 ENCOUNTER — Other Ambulatory Visit: Payer: Self-pay | Admitting: Family Medicine

## 2023-06-17 DIAGNOSIS — K219 Gastro-esophageal reflux disease without esophagitis: Secondary | ICD-10-CM

## 2023-07-05 ENCOUNTER — Ambulatory Visit
Admission: EM | Admit: 2023-07-05 | Discharge: 2023-07-05 | Disposition: A | Discharge status: Home / Self Care | Attending: Family Medicine | Admitting: Family Medicine

## 2023-07-05 DIAGNOSIS — J069 Acute upper respiratory infection, unspecified: Secondary | ICD-10-CM | POA: Diagnosis not present

## 2023-07-05 LAB — POC SARS CORONAVIRUS 2 AG -  ED: SARS Coronavirus 2 Ag: NEGATIVE

## 2023-07-05 MED ORDER — BENZONATATE 100 MG PO CAPS: 100.0000 mg | ORAL_CAPSULE | Freq: Three times a day (TID) | ORAL | 0 refills | Status: DC | PRN

## 2023-07-05 MED ORDER — FLUTICASONE PROPIONATE 50 MCG/ACT NA SUSP: 2.0000 | Freq: Every day | NASAL | 0 refills | Status: AC

## 2023-07-05 NOTE — ED Provider Notes (Signed)
 [facility]    CSN: [id number] Arrival date & time: 07/05/23  1039      History   Chief Complaint Chief Complaint  Patient presents with   Sore Throat   Headache   Cough    HPI Latoya Thompson is a 63 y.o. female.    Sore Throat Associated symptoms include headaches.  Headache Associated symptoms: cough   Cough Associated symptoms: headaches   Here for cough and some mild nasal congestion.  She has had some headache off and on and maybe has felt chilled.  She notes her sense of taste is not like usual.  She has not had any fever and no myalgia.  No nausea or vomiting or diarrhea or shortness of breath or wheezing.  No ear pain.  Symptoms began on April 22.  Her husband was ill 1 week ago with similar symptoms.  He did not have any testing done.  She is allergic to codeine  Past medical history significant for hypertension.  Last EGFR was in the 50s.  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   Routine general medical examination at a health care facility 02/13/2022   Dermatitis of face 02/13/2022   Stress incontinence in female 07/12/2020   Prediabetes 07/12/2020   Cardiomyopathy, unspecified type (HCC) 05/29/2019   GERD (gastroesophageal reflux disease) 07/03/2018   Insomnia 07/03/2018   Tremor, unspecified 01/02/2018   Tinnitus, right ear 01/02/2018   NASH (nonalcoholic steatohepatitis) 05/18/2016   Overweight (BMI 25.0-29.9) 05/18/2016   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      Home Medications    Prior to Admission medications   Medication Sig Start Date End Date Taking? Authorizing Provider  benzonatate  (TESSALON ) 100 MG capsule Take 1 capsule (100 mg total) by mouth 3 (three) times daily as needed for cough. 07/05/23  Yes Ann Keto, MD  fluticasone  (FLONASE ) 50 MCG/ACT nasal spray Place 2 sprays into both nostrils daily. 07/05/23  Yes Dim Meisinger K, MD  aspirin EC 81 MG tablet Take 81 mg by mouth daily.    [provider]  azelastine  (ASTELIN ) 0.1 % nasal spray Place 1 spray into both nostrils 2 (two) times daily. Use in each nostril as directed 08/29/22   [place], Betty G, MD  carbamide peroxide (DEBROX) 6.5 % OTIC solution Place 5 drops into the left ear 2 (two) times daily. 08/29/22   [place], Betty G, MD  Cholecalciferol (VITAMIN D3) 25 MCG (1000 UT) CAPS Take 1 capsule by mouth 3 (three) times a week.    [provider]  enalapril  (VASOTEC ) 20 MG tablet Take 1 tablet in the morning and 1/2 tablet in the evening. 01/30/23   [place], Betty G, MD  ibuprofen  (ADVIL ) 600 MG tablet Take 1 tablet (600 mg total) by mouth every 8 (eight) hours as needed. 05/30/23   Rodriguez-Southworth, Sylvia, PA-C  lovastatin  (MEVACOR ) 40 MG  tablet TAKE 1 TABLET BY MOUTH AT BEDTIME 11/07/22   [place], Betty G, MD  pantoprazole  (PROTONIX ) 20 MG tablet Take 1 tablet by mouth once daily 06/18/23   [place], Betty G, MD  sodium fluoride (FLUORISHIELD) 1.1 % GEL dental gel SF 5000 Plus 1.1 % dental cream  BRUSH THOROUGHLY ONCE DAILY AT BEDTIME    [provider]  verapamil  (CALAN ) 120 MG tablet Take 1 tablet by mouth once daily 02/23/23   [place], Betty G, MD  vitamin C (ASCORBIC ACID) 500 MG tablet Take 500 mg by mouth daily.    [provider]  vitamin E 400 UNIT capsule Take 400 Units by mouth daily.    [provider]    Family History Family History  Problem Relation Age of Onset   Hyperlipidemia  Mother    Heart disease Mother    Stroke Mother    Hypertension Mother    Diabetes Mother    Hyperlipidemia Father    Heart disease Father    Stroke Father    Hypertension Father    Diabetes Father     Social History Social History   Tobacco Use   Smoking status: Never   Smokeless tobacco: Never  Vaping Use   Vaping status: Never Used  Substance Use Topics   Alcohol use: No   Drug use: No     Allergies   Codeine   Review of Systems Review of Systems  Respiratory:  Positive for cough.   Neurological:  Positive for headaches.     Physical Exam Triage Vital Signs ED Triage Vitals [07/05/23 1103]  Encounter Vitals Group     BP (!) 144/85     Systolic BP Percentile      Diastolic BP Percentile      Pulse Rate 63     Resp 17     Temp 98.2 F (36.8 C)     Temp Source Oral     SpO2 95 %     Weight      Height      Head Circumference      Peak Flow      Pain Score 0     Pain Loc      Pain Education      Exclude from Growth Chart    No data found.  Updated Vital Signs BP (!) 144/85 (BP Location: Left Arm)   Pulse 63   Temp 98.2 F (36.8 C) (Oral)   Resp 17   SpO2 95%   Visual Acuity Right Eye Distance:   Left Eye Distance:   Bilateral Distance:    Right Eye Near:   Left Eye Near:    Bilateral Near:     Physical Exam Vitals reviewed.  Constitutional:      General: She is not in acute distress.    Appearance: She is not toxic-appearing.  HENT:     Right Ear: Tympanic membrane and ear canal normal.     Left Ear: Ear canal normal.     Ears:     Comments: Left tympanic membrane is obscured by cerumen    Nose: Nose normal.     Mouth/Throat:     Mouth: Mucous membranes are moist.     Pharynx: No oropharyngeal exudate or posterior oropharyngeal erythema.  Eyes:     Extraocular Movements: Extraocular movements intact.     Conjunctiva/sclera: Conjunctivae normal.     Pupils: Pupils are equal, round, and reactive to light.  Cardiovascular:  Rate and Rhythm: Normal rate and regular rhythm.     Heart sounds: No murmur heard. Pulmonary:     Effort: Pulmonary effort is normal. No respiratory distress.     Breath sounds: No stridor. No wheezing, rhonchi or rales.  Musculoskeletal:     Cervical back: Neck supple.  Lymphadenopathy:     Cervical: No cervical adenopathy.  Skin:    Capillary Refill: Capillary refill takes less than 2 seconds.     Coloration: Skin is not jaundiced or pale.  Neurological:     General: No focal deficit present.     Mental Status: She is alert and oriented to person, place, and time.  Psychiatric:        Behavior: Behavior normal.      UC Treatments / Results  Labs (all labs ordered are listed, but only abnormal results are displayed) Labs Reviewed  POC SARS CORONAVIRUS 2 AG -  ED    EKG   Radiology No results found.  Procedures Procedures (including critical care time)  Medications Ordered in UC Medications - No data to display  Initial Impression / Assessment and Plan / UC Course  I have reviewed the triage vital signs and the nursing notes.  Pertinent labs & imaging results that were available during my care of the patient were reviewed by me and considered in my medical decision making (see chart for details).   COVID test is negative.  She inquired about antibiotics.  I discussed with her that this is most likely viral in origin at this point.  Tessalon  Perles are sent in for cough and Flonase  is sent into see if that will help her symptoms improve faster.   Final Clinical Impressions(s) / UC Diagnoses   Final diagnoses:  Viral URI     Discharge Instructions      The test for COVID is negative.  Take benzonatate  100 mg, 1 tab every 8 hours as needed for cough.  Fluticasone /Flonase  nose spray--put 2 sprays in each nostril once daily       ED Prescriptions     Medication Sig Dispense Auth. Provider   benzonatate  (TESSALON ) 100 MG capsule Take 1 capsule  (100 mg total) by mouth 3 (three) times daily as needed for cough. 21 capsule Ann Keto, MD   fluticasone  (FLONASE ) 50 MCG/ACT nasal spray Place 2 sprays into both nostrils daily. 16 g Ann Keto, MD      I have reviewed the PDMP during this encounter.   Ann Keto, MD 07/05/23 [phone]

## 2023-07-05 NOTE — ED Triage Notes (Signed)
 Pt present with c/o sore throat, cough and headaches since Tuesday. Pt has taken Mucinex, has not had any relief. Denies any pain, no fever.

## 2023-07-05 NOTE — Discharge Instructions (Signed)
 The test for COVID is negative.  Take benzonatate  100 mg, 1 tab every 8 hours as needed for cough.  Fluticasone /Flonase  nose spray--put 2 sprays in each nostril once daily

## 2023-07-06 ENCOUNTER — Ambulatory Visit: Admitting: Family Medicine

## 2023-07-23 ENCOUNTER — Encounter: Payer: Self-pay | Admitting: Family Medicine

## 2023-07-23 ENCOUNTER — Ambulatory Visit (INDEPENDENT_AMBULATORY_CARE_PROVIDER_SITE_OTHER): Admitting: Family Medicine

## 2023-07-23 VITALS — BP 126/80 | HR 64 | Temp 98.0°F | Resp 16 | Ht 63.0 in | Wt 154.0 lb

## 2023-07-23 DIAGNOSIS — J3489 Other specified disorders of nose and nasal sinuses: Secondary | ICD-10-CM | POA: Diagnosis not present

## 2023-07-23 DIAGNOSIS — H6122 Impacted cerumen, left ear: Secondary | ICD-10-CM

## 2023-07-23 DIAGNOSIS — R519 Headache, unspecified: Secondary | ICD-10-CM

## 2023-07-23 DIAGNOSIS — L282 Other prurigo: Secondary | ICD-10-CM

## 2023-07-23 DIAGNOSIS — I10 Essential (primary) hypertension: Secondary | ICD-10-CM

## 2023-07-23 MED ORDER — ENALAPRIL MALEATE 20 MG PO TABS: 20.0000 mg | ORAL_TABLET | Freq: Two times a day (BID) | ORAL | 2 refills | Status: DC

## 2023-07-23 MED ORDER — AMOXICILLIN-POT CLAVULANATE 875-125 MG PO TABS: 1.0000 | ORAL_TABLET | Freq: Two times a day (BID) | ORAL | 0 refills | Status: AC

## 2023-07-23 NOTE — Progress Notes (Signed)
 ACUTE VISIT Chief Complaint  Patient presents with   Ear Pain    X 2 weeks, comes & goes   jaw discomfort   Headache    BP has been good   HPI: [name] is a 63 y.o. female with past medical history significant for prediabetes, hyperlipidemia, hypertension, and LBBB here today complaining of right ear pain, upper right jaw discomfort, and headaches, as described above.   Right earache: Intermittent ear pain started about 2 weeks ago.  She describes her pain as dull and rates it at 5/10 at most.  Of note, pt was also having URI symptoms 2 weeks ago, for which she presented to the urgent care at the time.  No fullness sensation.  Pt took a Tylenol  last night for pain management.  She reports listening to music with headphones while at the gym and wonders if this has contributing to this problem.  Left ear fullness sensation, mild decreased hearing. No earache or drainage.   -She also complains of upper right mandibular/facial pain, starting 1-2 days.  Experiences occasional jaw pain when opening her mouth.  She is UTD with routine dental care and states her dentist has not previously mentioned the presence of any cavities.  No hx of trauma.  She endorses a frontal headache yesterday, which she attributes to not eating enough.  Denies any associated nausea or vomiting.  Pain is described as mild.  HTN:She has been taking Enalapril  20 mg in the morning and 10 mg (1/2 tab) at night, however 2 days a week she takes a full tab at nighttime.  Pt monitors her BP at home and per her BP log she presented today, her systolic blood pressures average in the 130-150s.  Denies visual changes, chest pain, dyspnea, palpitation, focal weakness, or edema. Lab Results  Component Value Date   NA 143 04/09/2023   CL 108 04/09/2023   K 3.9 04/09/2023   CO2 28 04/09/2023   BUN 21 04/09/2023   CREATININE 1.02 04/09/2023   GFR 58.82 (L) 04/09/2023   CALCIUM 9.3 04/09/2023   ALBUMIN 4.4  04/09/2023   GLUCOSE 86 04/09/2023   -Pt additionally reports a rash on her upper buttocks area.  She states this rash has been present for some time.  She requested the rash be looked at today.  She has not identified exacerbating or alleviating factors. She has not tried OTC medication.  Review of Systems  Constitutional:  Negative for activity change, appetite change and fever.  HENT:  Positive for postnasal drip. Negative for dental problem, facial swelling, mouth sores, nosebleeds and sore throat.   Gastrointestinal:  Negative for abdominal pain.  Genitourinary:  Negative for decreased urine volume and hematuria.  Musculoskeletal:  Negative for gait problem and myalgias.  Allergic/Immunologic: Positive for environmental allergies.  Neurological:  Negative for syncope and facial asymmetry.  See other pertinent positives and negatives in HPI.  Current Outpatient Medications on File Prior to Visit  Medication Sig Dispense Refill   aspirin EC 81 MG tablet Take 81 mg by mouth daily.     azelastine  (ASTELIN ) 0.1 % nasal spray Place 1 spray into both nostrils 2 (two) times daily. Use in each nostril as directed 30 mL 6   benzonatate  (TESSALON ) 100 MG capsule Take 1 capsule (100 mg total) by mouth 3 (three) times daily as needed for cough. 21 capsule 0   Cholecalciferol (VITAMIN D3) 25 MCG (1000 UT) CAPS Take 1 capsule by mouth 3 (three) times  a week.     fluticasone  (FLONASE ) 50 MCG/ACT nasal spray Place 2 sprays into both nostrils daily. 16 g 0   ibuprofen  (ADVIL ) 600 MG tablet Take 1 tablet (600 mg total) by mouth every 8 (eight) hours as needed. 30 tablet 0   lovastatin  (MEVACOR ) 40 MG tablet TAKE 1 TABLET BY MOUTH AT BEDTIME 90 tablet 2   pantoprazole  (PROTONIX ) 20 MG tablet Take 1 tablet by mouth once daily 90 tablet 1   sodium fluoride (FLUORISHIELD) 1.1 % GEL dental gel SF 5000 Plus 1.1 % dental cream  BRUSH THOROUGHLY ONCE DAILY AT BEDTIME     verapamil  (CALAN ) 120 MG tablet Take  1 tablet by mouth once daily 90 tablet 1   vitamin C (ASCORBIC ACID) 500 MG tablet Take 500 mg by mouth daily.     vitamin E 400 UNIT capsule Take 400 Units by mouth daily.     No current facility-administered medications on file prior to visit.   Past Medical History:  Diagnosis Date   Chicken pox    Hypercholesteremia    Hypertension    Keratoconus    Left   Allergies  Allergen Reactions   Codeine Itching    Social History   Socioeconomic History   Marital status: Married    Spouse name: Latoya Thompson   Number of children: 0   Years of education: Not on file   Highest education level: Bachelor's degree (e.g., BA, AB, BS)  Occupational History   Occupation: Educator  Tobacco Use   Smoking status: Never   Smokeless tobacco: Never  Vaping Use   Vaping status: Never Used  Substance and Sexual Activity   Alcohol use: No   Drug use: No   Sexual activity: Yes  Other Topics Concern   Not on file  Social History Narrative   Married, lives with husband Latoya Thompson in a 2 story home. Drinks 1/2 cup of coffee a day, rare soda or tea. Exercise regularly for at least 60 minutes.   Social Drivers of Corporate investment banker Strain: Low Risk  (06/28/2022)   Overall Financial Resource Strain (CARDIA)    Difficulty of Paying Living Expenses: Not hard at all  Food Insecurity: No Food Insecurity (08/18/2022)   Hunger Vital Sign    Worried About Running Out of Food in the Last Year: Never true    Ran Out of Food in the Last Year: Never true  Transportation Needs: No Transportation Needs (06/28/2022)   PRAPARE - Administrator, Civil Service (Medical): No    Lack of Transportation (Non-Medical): No  Physical Activity: Sufficiently Active (06/28/2022)   Exercise Vital Sign    Days of Exercise per Week: 4 days    Minutes of Exercise per Session: 60 min  Stress: No Stress Concern Present (06/28/2022)   [employer] of Occupational Health - Occupational Stress Questionnaire     Feeling of Stress : Not at all  Social Connections: Socially Integrated (06/28/2022)   Social Connection and Isolation Panel [NHANES]    Frequency of Communication with Friends and Family: More than three times a week    Frequency of Social Gatherings with Friends and Family: More than three times a week    Attends Religious Services: More than 4 times per year    Active Member of [employer] or Organizations: Yes    Attends Banker Meetings: More than 4 times per year    Marital Status: Married    Vitals:   07/23/23  0914  BP: 126/80  Pulse: 64  Resp: 16  Temp: 98 F (36.7 C)  SpO2: 97%   Body mass index is 27.28 kg/m.  Physical Exam Vitals and nursing note reviewed.  Constitutional:      General: She is not in acute distress.    Appearance: She is well-developed.  HENT:     Head: Normocephalic and atraumatic.     Jaw: No tenderness or pain on movement.     Comments: Mild TMJ crepitus with ROM.     Right Ear: Tympanic membrane, ear canal and external ear normal.     Left Ear: There is impacted cerumen (could not see TM).     Nose:     Right Sinus: Maxillary sinus tenderness present. No frontal sinus tenderness.     Left Sinus: No maxillary sinus tenderness or frontal sinus tenderness.     Mouth/Throat:     Mouth: Mucous membranes are moist.     Pharynx: Oropharynx is clear.  Eyes:     Conjunctiva/sclera: Conjunctivae normal.     Comments: Right conjunctiva is not injected. No exudate or hemorrhage. Left cornea opaque/hazy.   Cardiovascular:     Rate and Rhythm: Normal rate and regular rhythm.     Heart sounds: No murmur heard. Pulmonary:     Effort: Pulmonary effort is normal. No respiratory distress.     Breath sounds: Normal breath sounds.  Abdominal:     Palpations: Abdomen is soft. There is no mass.     Tenderness: There is no abdominal tenderness.  Lymphadenopathy:     Cervical: No cervical adenopathy.  Skin:    General: Skin is warm.     Findings:  No erythema or rash.       Neurological:     General: No focal deficit present.     Mental Status: She is alert and oriented to person, place, and time.     Gait: Gait normal.  Psychiatric:        Mood and Affect: Mood and affect normal.   ASSESSMENT AND PLAN: Ms. SYNETHIA TAKACH was seen today for ear pain, jaw, pain and headaches.  Pain of maxillary sinus We discussed possible etiologies, probably has been going on for about 2 weeks, so she agrees with antibiotic trial, some side effects discussed. If not resolved, she may need to see her dentist and we could consider sinus imaging. Flonase  nasal spray at bedtime for 10 to 14 days and nasal saline irrigations throughout the day as needed recommended.  -     Amoxicillin-Pot Clavulanate; Take 1 tablet by mouth 2 (two) times daily for 7 days.  Dispense: 14 tablet; Refill: 0  Hearing loss of left ear due to cerumen impaction After verbal consent, ear lavage was performed, and successful.  With a small curette, I removed cerumen excess from the left ear, TM with no erythema.  Hearing loss improved and back to baseline. Ear Cerumen Removal  Date/Time: 07/23/2023 11:35 AM  Performed by: [place], Anu Stagner G, MD Authorized by: [place], Nael Petrosyan G, MD   Anesthesia: Local Anesthetic: none Location details: left ear Patient tolerance: patient tolerated the procedure well with no immediate complications Procedure type: curette  Sedation: Patient sedated: no    Headache, unspecified headache type Mild. Possible etiologies discussed, allergies and sinus problems could be contributing factors. I do not think imaging is needed at this time.  Pruritic rash Chronic, currently is not active. For future episodes she can try OTC hydrocortisone 1%, small  amount twice daily as needed. Follow-up as needed.  Primary hypertension Assessment & Plan: Most of his BPs at home above 130 and son 140s to 150s. Recommend increasing night dose of enalapril   from 10 mg to 20 mg, continue morning dose 20 mg. Continue verapamil  120 mg daily and low-salt diet. Continue monitoring BP regularly. Keep next follow-up appointment in 09/2023.  -     Enalapril  Maleate; Take 1 tablet (20 mg total) by mouth 2 (two) times daily. Take 1 tablet in the morning and 1/2 tablet in the evening.  Dispense: 180 tablet; Refill: 2  Return if symptoms worsen or fail to improve, for keep next appointment.  I, Bernita Bristle, acting as a scribe for Harlie Ragle [place], MD., have documented all relevant documentation on the behalf of Lajada Janes [place], MD, as directed by   while in the presence of Shandreka Dante [place], MD.  I, Jahmal Dunavant [place], MD, have reviewed all documentation for this visit. The documentation on 07/23/23 for the exam, diagnosis, procedures, and orders are all accurate and complete.  Nashla Althoff G. [place], MD  [facility] [place] office.

## 2023-07-23 NOTE — Patient Instructions (Addendum)
 A few things to remember from today's visit:  Headache, unspecified headache type  Essential hypertension - Plan: enalapril  (VASOTEC ) 20 MG tablet  Pruritic rash  Pain of maxillary sinus  Hearing loss of left ear due to cerumen impaction  Primary hypertension  Try antibiotic treatment for right maxillary pain. Nasal saline irrigations as needed during the day and Flonase  nasal spray at bedtime. Increase Enalapril  from 30 mg to 40 mg daily, 20 mg 2 times daily.  Over the counter hydrocortisone, small amount, on area of itchy rash.  Monitor for new symptoms associated with headache.  If you need refills for medications you take chronically, please call your pharmacy. Do not use My Chart to request refills or for acute issues that need immediate attention. If you send a my chart message, it may take a few days to be addressed, specially if I am not in the office.  Please be sure medication list is accurate. If a new problem present, please set up appointment sooner than planned today.

## 2023-07-23 NOTE — Assessment & Plan Note (Signed)
 Most of his BPs at home above 130 and son 140s to 150s. Recommend increasing night dose of enalapril  from 10 mg to 20 mg, continue morning dose 20 mg. Continue verapamil  120 mg daily and low-salt diet. Continue monitoring BP regularly. Keep next follow-up appointment in 09/2023.

## 2023-07-24 ENCOUNTER — Telehealth: Payer: Self-pay

## 2023-07-24 NOTE — Telephone Encounter (Signed)
 Please see message below from [facility]. Please advise on clarification of prescription.   Copied from CRM [id number]. Topic: Clinical - Prescription Issue >> Jul 24, 2023  2:30 PM Luane Rumps D wrote: Reason for CRM: [facility] calling for clarification on instructions for enalapril  (VASOTEC ) 20 MG tablet. States that order said take 1 tablet in am and 1 in pm, but also says take 1 in am and 1/2 in pm. Requesting clarification on which is correct?

## 2023-07-24 NOTE — Telephone Encounter (Signed)
 Spoke with pharmacy, Rx is for 1 tab bid. Nothing else needed.

## 2023-07-28 ENCOUNTER — Other Ambulatory Visit: Payer: Self-pay | Admitting: Family Medicine

## 2023-08-14 ENCOUNTER — Other Ambulatory Visit: Payer: Self-pay | Admitting: Family Medicine

## 2023-08-14 DIAGNOSIS — I10 Essential (primary) hypertension: Secondary | ICD-10-CM

## 2023-09-23 ENCOUNTER — Other Ambulatory Visit: Payer: Self-pay | Admitting: Family Medicine

## 2023-09-23 DIAGNOSIS — I10 Essential (primary) hypertension: Secondary | ICD-10-CM

## 2023-10-08 ENCOUNTER — Encounter: Payer: Self-pay | Admitting: Family Medicine

## 2023-10-08 ENCOUNTER — Ambulatory Visit: Payer: Self-pay | Admitting: Family Medicine

## 2023-10-08 ENCOUNTER — Ambulatory Visit (INDEPENDENT_AMBULATORY_CARE_PROVIDER_SITE_OTHER): Payer: PPO | Admitting: Family Medicine

## 2023-10-08 VITALS — BP 128/80 | HR 73 | Temp 98.5°F | Resp 16 | Ht 63.0 in | Wt 152.0 lb

## 2023-10-08 DIAGNOSIS — N1831 Chronic kidney disease, stage 3a: Secondary | ICD-10-CM

## 2023-10-08 DIAGNOSIS — R251 Tremor, unspecified: Secondary | ICD-10-CM

## 2023-10-08 DIAGNOSIS — E785 Hyperlipidemia, unspecified: Secondary | ICD-10-CM

## 2023-10-08 DIAGNOSIS — I10 Essential (primary) hypertension: Secondary | ICD-10-CM | POA: Diagnosis not present

## 2023-10-08 DIAGNOSIS — R7303 Prediabetes: Secondary | ICD-10-CM

## 2023-10-08 LAB — LIPID PANEL
Cholesterol: 201 mg/dL — ABNORMAL HIGH (ref 0–200)
HDL: 51.9 mg/dL (ref >39.00)
LDL Cholesterol: 130 mg/dL — ABNORMAL HIGH (ref 0–99)
NonHDL: 149.32
Total CHOL/HDL Ratio: 4
Triglycerides: 95 mg/dL (ref 0.0–149.0)
VLDL: 19 mg/dL (ref 0.0–40.0)

## 2023-10-08 LAB — BASIC METABOLIC PANEL WITH GFR
BUN: 16 mg/dL (ref 6–23)
CO2: 24 meq/L (ref 19–32)
Calcium: 9.7 mg/dL (ref 8.4–10.5)
Chloride: 107 meq/L (ref 96–112)
Creatinine, Ser: 1.05 mg/dL (ref 0.40–1.20)
GFR: 56.61 mL/min — ABNORMAL LOW (ref >60.00)
Glucose, Bld: 88 mg/dL (ref 70–99)
Potassium: 4 meq/L (ref 3.5–5.1)
Sodium: 142 meq/L (ref 135–145)

## 2023-10-08 LAB — HEMOGLOBIN A1C: Hgb A1c MFr Bld: 5.7 % (ref 4.6–6.5)

## 2023-10-08 MED ORDER — AMLODIPINE BESYLATE 5 MG PO TABS
5.0000 mg | ORAL_TABLET | Freq: Every day | ORAL | 0 refills | Status: DC
Start: 2023-10-08 — End: 2024-01-02

## 2023-10-08 NOTE — Assessment & Plan Note (Signed)
 We reviewed diagnosis, prognosis, and treatment options. Propranolol versus primidone , the former one will help also with BP management, we discussed son side effects given her history of LBBB.  She prefers to hold on pharmacologic treatment for now.

## 2023-10-08 NOTE — Assessment & Plan Note (Signed)
 Last hemoglobin A1c was 6.0 in 03/2023. Encouraged consistency with a healthy lifestyle. Further recommendation will be given according to hemoglobin A1c result.

## 2023-10-08 NOTE — Assessment & Plan Note (Addendum)
 Currently on Lovastatin  40 mg daily. Last LDL 134, went up from 113 with no medication changes. She would like to have lipid panel rechecked today.Further recommendation will be given accordingly.

## 2023-10-08 NOTE — Patient Instructions (Addendum)
 A few things to remember from today's visit:  Prediabetes - Plan: Basic metabolic panel with GFR, Hemoglobin A1c  Primary hypertension - Plan: Basic metabolic panel with GFR  Hyperlipidemia, unspecified hyperlipidemia type - Plan: Lipid panel  Tremor, unspecified  Verapamil  to be wean off, so we can add Amlodipine . Alternate 1 and 1/2 tab every other day for 10 days then 1/2 tab daily for 10 days then 1/2 tab every other day for 10 days at this point you start Amlodipine  5 mg daily.  No changes in Enalapril . Blood pressure reading s3-4 weeks after been on Amlodipine .  If you need refills for medications you take chronically, please call your pharmacy. Do not use My Chart to request refills or for acute issues that need immediate attention. If you send a my chart message, it may take a few days to be addressed, specially if I am not in the office.  Please be sure medication list is accurate. If a new problem present, please set up appointment sooner than planned today.

## 2023-10-08 NOTE — Assessment & Plan Note (Signed)
 BP readings at home still elevated. We discussed options, she agrees with weaning of verapamil  and trying amlodipine  5 mg daily, instructions how to wean off verapamil  given on AVS. Continue enalapril  20 mg twice daily. Continue low-salt diet. BP readings 3 to 4 weeks after restarting amlodipine . Eye exam is current. Follow-up in 4 months.

## 2023-10-08 NOTE — Progress Notes (Signed)
 HPI: [name] is a 62 y.o. female  with PMHx significant for prediabetes, hyperlipidemia, hypertension, and LBBB, who is here today for chronic disease management.  Last seen on 07/23/23  Hypertension:  Currently on Verapamil  120 mg once daily and enalapril  20 mg twice daily, the dose of enalapril  was increased since last visit. No adverse side effects. BP readings at home: ranging from 126-157 systolic and 76-90 diastolic.  Negative for unusual or severe headache, visual changes, exertional chest pain, dyspnea,  focal weakness, or edema. Lab Results  Component Value Date   CREATININE 1.02 04/09/2023   BUN 21 04/09/2023   NA 143 04/09/2023   K 3.9 04/09/2023   CL 108 04/09/2023   CO2 28 04/09/2023  eGFR has been mildly abnormal, mid 50s. Creatinine 1.0-1.1. Negative for foamy urine, gross hematuria, or decreased urine output.  Hyperlipidemia: Currently on Lovastatin  40 mg once daily.  Side effects from medication: None Lab Results  Component Value Date   CHOL 205 (H) 04/09/2023   HDL 55.80 04/09/2023   LDLCALC 134 (H) 04/09/2023   TRIG 79.0 04/09/2023   CHOLHDL 4 04/09/2023   Prediabetes:  Negative for polyuria, polydipsia, or polyphagia. Lab Results  Component Value Date   HGBA1C 6.0 04/09/2023   Hand tremors: Inquiring about pharmacologic treatment options. Pt also reports a bilateral hand tremor; L>R  This tends to occur intermittently and when she is typing for long period.  She has followed up by Dr. Skeet; last seen on 03/14/2017  She would like to have ears checked today, no symptoms.  Review of Systems  Constitutional:  Negative for activity change, appetite change, chills and fever.  HENT:  Negative for ear discharge, ear pain, hearing loss and sore throat.   Respiratory:  Negative for cough and wheezing.   Gastrointestinal:  Negative for abdominal pain, nausea and vomiting.  Genitourinary:  Negative for decreased urine volume, dysuria and  hematuria.  Skin:  Negative for rash.  Neurological:  Negative for syncope and facial asymmetry.  See other pertinent positives and negatives in HPI.  Current Outpatient Medications on File Prior to Visit  Medication Sig Dispense Refill   aspirin EC 81 MG tablet Take 81 mg by mouth daily.     Cholecalciferol (VITAMIN D3) 25 MCG (1000 UT) CAPS Take 1 capsule by mouth 3 (three) times a week.     enalapril  (VASOTEC ) 20 MG tablet Take 20 mg by mouth 2 (two) times daily.     fluticasone  (FLONASE ) 50 MCG/ACT nasal spray Place 2 sprays into both nostrils daily. 16 g 0   lovastatin  (MEVACOR ) 40 MG tablet TAKE 1 TABLET BY MOUTH AT BEDTIME 90 tablet 2   pantoprazole  (PROTONIX ) 20 MG tablet Take 1 tablet by mouth once daily 90 tablet 1   sodium fluoride (FLUORISHIELD) 1.1 % GEL dental gel SF 5000 Plus 1.1 % dental cream  BRUSH THOROUGHLY ONCE DAILY AT BEDTIME     verapamil  (CALAN ) 120 MG tablet Take 1 tablet by mouth once daily 90 tablet 2   vitamin C (ASCORBIC ACID) 500 MG tablet Take 500 mg by mouth daily.     vitamin E 400 UNIT capsule Take 400 Units by mouth daily.     No current facility-administered medications on file prior to visit.    Past Medical History:  Diagnosis Date   Cataract    Chicken pox    GERD (gastroesophageal reflux disease)    Hypercholesteremia    Hypertension    Keratoconus  Left   Allergies  Allergen Reactions   Codeine Itching    Social History   Socioeconomic History   Marital status: Married    Spouse name: Latoya Thompson   Number of children: 0   Years of education: Not on file   Highest education level: Master's degree (e.g., MA, MS, MEng, MEd, MSW, MBA)  Occupational History   Occupation: Educator  Tobacco Use   Smoking status: Never   Smokeless tobacco: Never  Vaping Use   Vaping status: Never Used  Substance and Sexual Activity   Alcohol use: No   Drug use: No   Sexual activity: Yes  Other Topics Concern   Not on file  Social History Narrative    Married, lives with husband Latoya Thompson in a 2 story home. Drinks 1/2 cup of coffee a day, rare soda or tea. Exercise regularly for at least 60 minutes.   Social Drivers of Corporate investment banker Strain: Low Risk  (10/05/2023)   Overall Financial Resource Strain (CARDIA)    Difficulty of Paying Living Expenses: Not hard at all  Food Insecurity: No Food Insecurity (10/05/2023)   Hunger Vital Sign    Worried About Running Out of Food in the Last Year: Never true    Ran Out of Food in the Last Year: Never true  Transportation Needs: No Transportation Needs (10/05/2023)   PRAPARE - Administrator, Civil Service (Medical): No    Lack of Transportation (Non-Medical): No  Physical Activity: Sufficiently Active (10/05/2023)   Exercise Vital Sign    Days of Exercise per Week: 5 days    Minutes of Exercise per Session: 90 min  Stress: No Stress Concern Present (10/05/2023)   [employer] of Occupational Health - Occupational Stress Questionnaire    Feeling of Stress: Not at all  Social Connections: Moderately Integrated (10/05/2023)   Social Connection and Isolation Panel    Frequency of Communication with Friends and Family: Three times a week    Frequency of Social Gatherings with Friends and Family: Once a week    Attends Religious Services: More than 4 times per year    Active Member of Clubs or Organizations: No    Attends Banker Meetings: Not on file    Marital Status: Married    Vitals:   10/08/23 0813  BP: 128/80  Pulse: 73  Resp: 16  Temp: 98.5 F (36.9 C)  SpO2: 99%   Body mass index is 26.93 kg/m.  Physical Exam Vitals and nursing note reviewed.  Constitutional:      General: She is not in acute distress.    Appearance: She is well-developed.  HENT:     Head: Normocephalic and atraumatic.     Right Ear: Tympanic membrane, ear canal and external ear normal.     Left Ear: Tympanic membrane, ear canal and external ear normal.      Mouth/Throat:     Mouth: Mucous membranes are moist.     Pharynx: Oropharynx is clear.  Eyes:     Conjunctiva/sclera: Conjunctivae normal.     Comments: Right conjunctiva is not injected. No exudate or hemorrhage. Left cornea opaque/hazy.   Cardiovascular:     Rate and Rhythm: Normal rate and regular rhythm.     Heart sounds: No murmur heard. Pulmonary:     Effort: Pulmonary effort is normal. No respiratory distress.     Breath sounds: Normal breath sounds.  Abdominal:     Palpations: Abdomen is soft. There is  no mass.     Tenderness: There is no abdominal tenderness.  Lymphadenopathy:     Cervical: No cervical adenopathy.  Skin:    General: Skin is warm.     Findings: No erythema or rash.  Neurological:     General: No focal deficit present.     Mental Status: She is alert and oriented to person, place, and time.     Motor: Tremor (Mild affecting left hand, not present at rest.) present.     Gait: Gait normal.  Psychiatric:        Mood and Affect: Mood and affect normal.    ASSESSMENT AND PLAN: Latoya Thompson was seen today for chronic disease management.  Orders Placed This Encounter  Procedures   Basic metabolic panel with GFR   Hemoglobin A1c   Lipid panel   Lab Results  Component Value Date   NA 142 10/08/2023   CL 107 10/08/2023   K 4.0 10/08/2023   CO2 24 10/08/2023   BUN 16 10/08/2023   CREATININE 1.05 10/08/2023   GFR 56.61 (L) 10/08/2023   CALCIUM 9.7 10/08/2023   ALBUMIN 4.4 04/09/2023   GLUCOSE 88 10/08/2023   Lab Results  Component Value Date   CHOL 201 (H) 10/08/2023   HDL 51.90 10/08/2023   LDLCALC 130 (H) 10/08/2023   TRIG 95.0 10/08/2023   CHOLHDL 4 10/08/2023   Lab Results  Component Value Date   HGBA1C 5.7 10/08/2023   Prediabetes Assessment & Plan: Last hemoglobin A1c was 6.0 in 03/2023. Encouraged consistency with a healthy lifestyle. Further recommendation will be given according to hemoglobin A1c result.  Orders: -      Basic metabolic panel with GFR; Future -     Hemoglobin A1c; Future  Hyperlipidemia, unspecified hyperlipidemia type Assessment & Plan: Currently on Lovastatin  40 mg daily. Last LDL 134, went up from 113 with no medication changes. She would like to have lipid panel rechecked today.Further recommendation will be given accordingly.  Orders: -     Lipid panel; Future  Tremor, unspecified Assessment & Plan: We reviewed diagnosis, prognosis, and treatment options. Propranolol versus primidone , the former one will help also with BP management, we discussed son side effects given her history of LBBB.  She prefers to hold on pharmacologic treatment for now.   Primary hypertension Assessment & Plan: BP readings at home still elevated. We discussed options, she agrees with weaning of verapamil  and trying amlodipine  5 mg daily, instructions how to wean off verapamil  given on AVS. Continue enalapril  20 mg twice daily. Continue low-salt diet. BP readings 3 to 4 weeks after restarting amlodipine . Eye exam is current. Follow-up in 4 months.  Orders: -     Basic metabolic panel with GFR; Future -     amLODIPine  Besylate; Take 1 tablet (5 mg total) by mouth daily.  Dispense: 90 tablet; Refill: 0  Stage 3a chronic kidney disease (HCC)  Creatinine and EGFR have been stable since 2023, mid to upper 50s, creatinine 1.0-1.1. Continue adequate hydration, low-salt diet, and avoidance of NSAIDs. Continue enalapril .  I spent a total of 43 minutes in both face to face and non face to face activities for this visit on the date of this encounter. During this time history was obtained and documented, examination was performed, prior labs reviewed, and assessment/plan discussed.  Return in about 4 months (around 02/06/2024) for chronic problems.  I, Vernell Forest, acting as a scribe for Stanislawa Gaffin [place], MD., have documented all relevant documentation  on the behalf of Azaria Bartell [place], MD, as directed by   while  in the presence of Elysha Daw [place], MD.  I, Giulia Hickey [place], MD, have reviewed all documentation for this visit. The documentation on 10/08/23 for the exam, diagnosis, procedures, and orders are all accurate and complete.  Seif Teichert G. [place], MD  [facility] office

## 2023-10-23 ENCOUNTER — Telehealth: Payer: Self-pay

## 2023-10-23 NOTE — Telephone Encounter (Signed)
 Patient called and she has questions regarding when to start amlodipine . She says she has instructions to wean off verapamil , but is not sure if she takes the amlodipine  when she starts taking the verapamil  every other day for the 10 days. Advised per the notes on the AVS and my understanding is to wean off verapamil  so she can start amlodipine , so start it when completely off verapamil . Advised I will send this to Dr. [place] for further clarification, but for now hold on starting amlodipine  as long as still taking verapamil  until she hears back with the clarification. Patient verbalized understanding.   Copied from CRM #[id number]. Topic: Clinical - Medication Question >> Oct 23, 2023 11:36 AM Latoya Thompson wrote: Reason for CRM: Patient wants to know when she needs to start her new blood pressure medication amLODipine  (NORVASC ) 5 MG tablet. She wants to know if she should take both amLODipine  (NORVASC ) 5 MG tablet and Verapamil  at the same time. She is needing clarification on how she should take medication.

## 2023-10-25 ENCOUNTER — Telehealth: Payer: Self-pay

## 2023-10-25 NOTE — Telephone Encounter (Signed)
 Copied from CRM [id number]. Topic: Clinical - Medical Advice >> Oct 25, 2023  1:32 PM Latoya Thompson wrote: Reason for CRM: Patient states she has instructions to take Verapamil . Half a pill every other day for 10 days. However when she takes the half pill she gets headaches and her blood pressure rises. She doesn't think she can do the 10 days like this and would like to know of alternatives

## 2023-10-26 NOTE — Telephone Encounter (Signed)
 See other telephone encounter; pt was having headaches & Bp rising while weaning Verapamil . Recommended by Dr. Ozell to stop Verapamil  and start Amlodipine  the next day.

## 2023-10-26 NOTE — Telephone Encounter (Signed)
 Can you advise in PCP's absence? Patient was weaning off Verapamil  to start Amlodipine .

## 2023-10-26 NOTE — Telephone Encounter (Signed)
I spoke with pt, she is aware of message below.  

## 2023-10-26 NOTE — Telephone Encounter (Signed)
 I would just have her stop the verapamil  and start the amlodipine  the next day. I do not know of any evidence that indicates that verapamil  needs to be weaned.

## 2023-11-14 ENCOUNTER — Ambulatory Visit

## 2023-12-02 ENCOUNTER — Other Ambulatory Visit: Payer: Self-pay | Admitting: Family Medicine

## 2023-12-02 DIAGNOSIS — I10 Essential (primary) hypertension: Secondary | ICD-10-CM

## 2023-12-04 ENCOUNTER — Telehealth: Payer: Self-pay | Admitting: *Deleted

## 2023-12-04 MED ORDER — ENALAPRIL MALEATE 20 MG PO TABS: 20.0000 mg | ORAL_TABLET | Freq: Two times a day (BID) | ORAL | 2 refills | Status: DC

## 2023-12-04 NOTE — Telephone Encounter (Signed)
 Copied from CRM [id number]. Topic: Clinical - Medication Question >> Dec 04, 2023  9:07 AM Rea ORN wrote: Reason for CRM: Pt called to inquire when enalapril  (VASOTEC ) 20 MG tablet will be sent to pharmacy. Pt is taking her last tablet today.

## 2023-12-05 ENCOUNTER — Telehealth: Payer: Self-pay

## 2023-12-05 ENCOUNTER — Ambulatory Visit

## 2023-12-05 ENCOUNTER — Telehealth: Payer: Self-pay | Admitting: *Deleted

## 2023-12-05 MED ORDER — ENALAPRIL MALEATE 20 MG PO TABS: 20.0000 mg | ORAL_TABLET | Freq: Two times a day (BID) | ORAL | 2 refills | Status: DC

## 2023-12-05 NOTE — Addendum Note (Signed)
 Addended by: METTA MATTOCKS L on: 12/05/2023 11:40 AM   Modules accepted: Orders

## 2023-12-05 NOTE — Telephone Encounter (Signed)
 Copied from CRM #[id number]. Topic: Clinical - Medication Question >> Dec 05, 2023  9:51 AM Terri MATSU wrote: Reason for CRM: Patient stated she called [facility] pharmacy about her medication enalapril  enalapril  (VASOTEC ) 20 MG tablet and they stated it was sent to [facility]. I did advised her on my end I was showing [facility]. So just wanted to make sure the prescription was sent to [facility] and NOT [facility]. Thank you! If you can also call her to let her know which pharmacy the medication was sent to please. [phone]

## 2023-12-05 NOTE — Telephone Encounter (Signed)
 Rx sent to [facility]

## 2023-12-05 NOTE — Telephone Encounter (Signed)
 Copied from CRM [id number]. Topic: Clinical - Medication Question >> Dec 04, 2023  9:07 AM Rea ORN wrote: Reason for CRM: Pt called to inquire when enalapril  (VASOTEC ) 20 MG tablet will be sent to pharmacy. Pt is taking her last tablet today. >> Dec 05, 2023  8:28 AM Franky GRADE wrote: Patient is calling to follow up on the refill request for enalapril  (VASOTEC ) 20 MG , she is completely out for today and would like to know if it will be refilled. Please call to advise.

## 2023-12-05 NOTE — Telephone Encounter (Signed)
Patient informed rx was sent yesterday

## 2023-12-06 MED ORDER — ENALAPRIL MALEATE 20 MG PO TABS: 20.0000 mg | ORAL_TABLET | Freq: Two times a day (BID) | ORAL | 2 refills | Status: AC

## 2023-12-06 NOTE — Addendum Note (Signed)
 Addended by: METTA KRISTEN CROME on: 12/06/2023 04:13 PM   Modules accepted: Orders

## 2023-12-11 ENCOUNTER — Ambulatory Visit: Admitting: Family Medicine

## 2023-12-12 NOTE — Progress Notes (Deleted)
 Date:  12/12/2023   ID:  Latoya Thompson, DOB [birth year], MRN [id number]  PCP:  [place], Betty G, MD  Cardiologist:   Delford Electrophysiologist:  None   Evaluation Performed:  Follow-Up Visit  History of Present Illness:    63 y.o.history of blindness, LBBB, HTN, HLD DM-2 and mild DCM with EF 45-50% by echo 05/01/16 stable since echo 2015.  She had myovue 07/22/13 which was non ischemic She complained of dyspnea but BNP was normal at 24.  Carotid duplex done for tinnitus showed plaque no stenosis 01/01/18 She is on disability due to her blindness.  Also with some left sided tremors   TTE 05/10/20 stable EF 45-50% mild MR abnormal septal motion mild LVH  Functional class one no symptoms Compliant with meds   See [facility] for corneal transplant has had mild tremor in Right hand discussed primidone  with primary but deferred   No cardiac complaints Still has not f/u with neurologist  Past Medical History:  Diagnosis Date   Cataract    Chicken pox    GERD (gastroesophageal reflux disease)    Hypercholesteremia    Hypertension    Keratoconus    Left   Past Surgical History:  Procedure Laterality Date   BREAST SURGERY     biopsy   CORNEAL TRANSPLANT     EYE SURGERY       No outpatient medications have been marked as taking for the 12/25/23 encounter (Appointment) with Kian Gamarra C, MD.     Allergies:   Codeine   Social History   Tobacco Use   Smoking status: Never   Smokeless tobacco: Never  Vaping Use   Vaping status: Never Used  Substance Use Topics   Alcohol use: No   Drug use: No     Family Hx: The patient's family history includes Diabetes in her father and mother; Heart disease in her father and mother; Hyperlipidemia in her father and mother; Hypertension in her father and mother; Stroke in her father and mother.  ROS:   Please see the history of present illness.     All other systems reviewed and are negative.   Prior CV studies:   The following  studies were reviewed today:  Carotid Duplex 12/27/17 Echo: 05/01/16 Myovue 07/22/13   Echo 10/17/22     1. Left ventricular ejection fraction, by estimation, is 55 to 60%. The  left ventricle has normal function. The left ventricle has no regional  wall motion abnormalities. There is mild left ventricular hypertrophy.  Left ventricular diastolic parameters  are consistent with Grade II diastolic dysfunction (pseudonormalization).   2. The aortic valve is tricuspid. Aortic valve regurgitation is trivial.  No aortic stenosis is present.   3. The mitral valve is normal in structure. Trivial mitral valve  regurgitation. No evidence of mitral stenosis.   4. The inferior vena cava is normal in size with greater than 50%  respiratory variability, suggesting right atrial pressure of 3 mmHg.   5. Tricuspid valve regurgitation is mild to moderate.   6. There is no evidence of cardiac tamponade.   Labs/Other Tests and Data Reviewed:    EKG:  12/12/2023 SR Rate 68 LBBB no changes   Recent Labs: 04/09/2023: ALT 16 10/08/2023: BUN 16; Creatinine, Ser 1.05; Potassium 4.0; Sodium 142   Recent Lipid Panel Lab Results  Component Value Date/Time   CHOL 201 (H) 10/08/2023 09:14 AM   TRIG 95.0 10/08/2023 09:14 AM   HDL 51.90 10/08/2023 09:14 AM  CHOLHDL 4 10/08/2023 09:14 AM   LDLCALC 130 (H) 10/08/2023 09:14 AM   LDLCALC 124 (H) 11/26/2019 08:31 AM    Wt Readings from Last 3 Encounters:  10/08/23 152 lb (68.9 kg)  07/23/23 154 lb (69.9 kg)  04/09/23 154 lb 2 oz (69.9 kg)     Objective:    Vital Signs:  There were no vitals taken for this visit.   Affect appropriate Healthy:  appears stated age HEENT:Blind  Neck supple with no adenopathy JVP normal no bruits no thyromegaly Lungs clear with no wheezing and good diaphragmatic motion Heart:  S1/S2 no murmur, no rub, gallop or click PMI normal Abdomen: benighn, BS positve, no tenderness, no AAA no bruit.  No HSM or HJR Distal pulses  intact with no bruits No edema Neuro non-focal mild intermittent left hand tremor  Skin warm and dry No muscular weakness    ASSESSMENT & PLAN:    1. DCM:  EF normalized by TTE done 10/17/22  Continue ACE not volume overloaded 2. LBBB: chronic f/u ECG in a year  3. HTN: On vasotec  20 mg Primary changed to norvasc  in addition 4. HLD:  On statin labs with primary  5. 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 Jaffe in past   COVID-19 Education: The signs and symptoms of COVID-19 were discussed with the patient and how to seek care for testing (follow up with PCP or arrange E-visit).  The importance of social distancing was discussed today.  Medication Adjustments/Labs and Tests Ordered: Current medicines are reviewed at length with the patient today.  Concerns regarding medicines are outlined above.   Tests Ordered:  ***  Medication Changes:  None   Disposition:  Follow up in a year  Signed, Maude Emmer, MD  12/12/2023 1:22 PM     [facility]

## 2023-12-25 ENCOUNTER — Ambulatory Visit: Admitting: Cardiovascular Disease

## 2023-12-28 ENCOUNTER — Ambulatory Visit: Admitting: Family Medicine

## 2023-12-31 ENCOUNTER — Encounter: Payer: Self-pay | Admitting: Family Medicine

## 2023-12-31 ENCOUNTER — Ambulatory Visit (INDEPENDENT_AMBULATORY_CARE_PROVIDER_SITE_OTHER): Admitting: Family Medicine

## 2023-12-31 VITALS — BP 128/94 | HR 62 | Temp 98.4°F | Resp 16 | Ht 63.0 in | Wt 152.4 lb

## 2023-12-31 DIAGNOSIS — R251 Tremor, unspecified: Secondary | ICD-10-CM

## 2023-12-31 DIAGNOSIS — I429 Cardiomyopathy, unspecified: Secondary | ICD-10-CM | POA: Diagnosis not present

## 2023-12-31 DIAGNOSIS — I10 Essential (primary) hypertension: Secondary | ICD-10-CM | POA: Diagnosis not present

## 2023-12-31 DIAGNOSIS — H1013 Acute atopic conjunctivitis, bilateral: Secondary | ICD-10-CM | POA: Diagnosis not present

## 2023-12-31 DIAGNOSIS — G4700 Insomnia, unspecified: Secondary | ICD-10-CM | POA: Diagnosis not present

## 2023-12-31 MED ORDER — ZOLPIDEM TARTRATE 5 MG PO TABS
2.5000 mg | ORAL_TABLET | Freq: Every evening | ORAL | 2 refills | Status: AC | PRN
Start: 2023-12-31 — End: ?

## 2023-12-31 MED ORDER — CROMOLYN SODIUM 4 % OP SOLN
1.0000 [drp] | Freq: Three times a day (TID) | OPHTHALMIC | 1 refills | Status: AC | PRN
Start: 2023-12-31 — End: ?

## 2023-12-31 NOTE — Assessment & Plan Note (Signed)
 This is a chronic problem.  She has been taking her husband's Ambien  10 mg, 1/4 every night and it helps. Prescription for Ambien  5 mg sent to her pharmacy to continue one half nightly as needed. We discussed some side effects. Continue good sleep hygiene.

## 2023-12-31 NOTE — Patient Instructions (Signed)
 A few things to remember from today's visit:  Insomnia, unspecified type - Plan: zolpidem  (AMBIEN ) 5 MG tablet  Tremor, unspecified  Allergic conjunctivitis of both eyes - Plan: cromolyn (OPTICROM) 4 % ophthalmic solution  Flonase  nasal spray daily as needed for nasal congestion and saline nasal irrigations as needed. Cromoly daily as needed for eye itching.  If you need refills for medications you take chronically, please call your pharmacy. Do not use My Chart to request refills or for acute issues that need immediate attention. If you send a my chart message, it may take a few days to be addressed, specially if I am not in the office.  Please be sure medication list is accurate. If a new problem present, please set up appointment sooner than planned today.

## 2023-12-31 NOTE — Assessment & Plan Note (Addendum)
 Reporting lower readings at home. Continue amlodipine  5 mg daily and enalapril  20 mg twice daily as well as low-salt diet. Continue monitoring BP regularly.

## 2023-12-31 NOTE — Assessment & Plan Note (Signed)
 We reviewed differential diagnosis. History and examination suggest benign essential tremor. I do not think medication is needed at this time. Recommend monitoring for new symptoms.

## 2023-12-31 NOTE — Progress Notes (Signed)
 ACUTE VISIT Chief Complaint  Patient presents with   Eye Problem    Pt reports watering eyes/itching on both eyes. 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 Sx going on for about 1 wk and half. When school started. Not taking allergies med. Denied fever, sore throat, facial pressure.    Discussed the use of AI scribe software for clinical note transcription with the patient, who gave verbal consent to proceed.  History of Present Illness Latoya Thompson is a 63 year old female with past medical history significant for hypertension, essential tremor, hyperlipidemia, and bilateral unstable keratoconus; who presents with watery and itchy eyes as described above.  She experiences watery and itchy eyes once or twice a week for the past few mounts, which she suspects might be due to allergies. She avoids excessive scratching, opting to tap lightly instead. No yellow discharge is present. She reports vision changes related to her left chronic corneal edema, for which a new corneal transplant has been recommended after other more conservative treatments have failed. She has not identified exacerbating or alleviating factors. No changes in vision.  She experiences nasal congestion and a runny nose.  No associated fever, body aches, or chills. No sick contact. She has used Flonase  nasal spray in the past.  Essential tremor:L>R, stable. She is inquiring about the need for medication. Problem does not interfere with ADLs. Yeast is not present at rest, exacerbated by son activities like typing on the computer for long periods of time. She notes a family history of tremor, as her father had it.  Insomnia: She takes a quarter of a 10 mg Ambien  pill nightly, which is from her husband's prescription, to help with sleep issues. She experiences difficulty staying asleep, waking frequently to use the bathroom, and hot flashes contribute to her sleep disturbances.  Ambien  helps her to  sleep through the night, no side effects reported.  She was getting a prescription for Ambien  in the past, discontinued in 2020.  BP mildly elevated today. Reports home BPs 120s/70s. Currently she is on enalapril  20 mg twice daily and amlodipine  5 mg daily.  Review of Systems  Constitutional:  Negative for activity change and appetite change.  HENT:  Negative for facial swelling, sinus pain and sore throat.   Respiratory:  Negative for cough, shortness of breath and wheezing.   Gastrointestinal:  Negative for abdominal pain and nausea.  Endocrine: Negative for cold intolerance and heat intolerance.  Skin:  Negative for rash.  Allergic/Immunologic: Positive for environmental allergies.  Neurological:  Negative for syncope, weakness and headaches.  See other pertinent positives and negatives in HPI.  Current Outpatient Medications on File Prior to Visit  Medication Sig Dispense Refill   amLODipine  (NORVASC ) 5 MG tablet Take 1 tablet (5 mg total) by mouth daily. 90 tablet 0   aspirin EC 81 MG tablet Take 81 mg by mouth daily.     Cholecalciferol (VITAMIN D3) 25 MCG (1000 UT) CAPS Take 1 capsule by mouth 3 (three) times a week.     enalapril  (VASOTEC ) 20 MG tablet Take 1 tablet (20 mg total) by mouth 2 (two) times daily. 180 tablet 2   lovastatin  (MEVACOR ) 40 MG tablet TAKE 1 TABLET BY MOUTH AT BEDTIME 90 tablet 2   pantoprazole  (PROTONIX ) 20 MG tablet Take 1 tablet by mouth once daily 90 tablet 1   sodium fluoride (FLUORISHIELD) 1.1 % GEL dental gel SF 5000 Plus 1.1 % dental cream  BRUSH THOROUGHLY ONCE DAILY AT BEDTIME     vitamin C (ASCORBIC ACID) 500 MG tablet Take 500 mg by mouth daily.     vitamin E 400 UNIT capsule Take 400 Units by mouth daily.     fluticasone  (FLONASE ) 50 MCG/ACT nasal spray Place 2 sprays into both nostrils daily. (Patient not taking: Reported on 12/31/2023) 16 g 0   No current facility-administered medications on file prior to visit.    Past Medical History:   Diagnosis Date   Cataract    Chicken pox    GERD (gastroesophageal reflux disease)    Hypercholesteremia    Hypertension    Keratoconus    Left   Allergies  Allergen Reactions   Codeine Itching    Social History   Socioeconomic History   Marital status: Married    Spouse name: Deward   Number of children: 0   Years of education: Not on file   Highest education level: Master's degree (e.g., MA, MS, MEng, MEd, MSW, MBA)  Occupational History   Occupation: Educator  Tobacco Use   Smoking status: Never   Smokeless tobacco: Never  Vaping Use   Vaping status: Never Used  Substance and Sexual Activity   Alcohol use: No   Drug use: No   Sexual activity: Yes  Other Topics Concern   Not on file  Social History Narrative   Married, lives with husband Deward in a 2 story home. Drinks 1/2 cup of coffee a day, rare soda or tea. Exercise regularly for at least 60 minutes.   Social Drivers of Corporate investment banker Strain: Low Risk  (12/30/2023)   Overall Financial Resource Strain (CARDIA)    Difficulty of Paying Living Expenses: Not hard at all  Food Insecurity: No Food Insecurity (12/30/2023)   Hunger Vital Sign    Worried About Running Out of Food in the Last Year: Never true    Ran Out of Food in the Last Year: Never true  Transportation Needs: No Transportation Needs (12/30/2023)   PRAPARE - Administrator, Civil Service (Medical): No    Lack of Transportation (Non-Medical): No  Physical Activity: Sufficiently Active (12/30/2023)   Exercise Vital Sign    Days of Exercise per Week: 4 days    Minutes of Exercise per Session: 90 min  Stress: No Stress Concern Present (12/30/2023)   [employer] of Occupational Health - Occupational Stress Questionnaire    Feeling of Stress: Not at all  Social Connections: Moderately Integrated (12/30/2023)   Social Connection and Isolation Panel    Frequency of Communication with Friends and Family: Three times a week     Frequency of Social Gatherings with Friends and Family: Twice a week    Attends Religious Services: More than 4 times per year    Active Member of [employer] or Organizations: No    Attends Banker Meetings: Not on file    Marital Status: Married   Vitals:   12/31/23 1128  BP: (!) 128/94  Pulse: 62  Resp: 16  Temp: 98.4 F (36.9 C)  SpO2: 98%   Body mass index is 27 kg/m.  Physical Exam Vitals and nursing note reviewed.  Constitutional:      General: She is not in acute distress.    Appearance: She is well-developed. She is not ill-appearing.  HENT:     Head: Atraumatic.     Nose: No rhinorrhea.     Right Turbinates: Enlarged.  Left Turbinates: Enlarged.  Eyes:     Comments: Right conjunctiva is not injected. No exudate or hemorrhage. Left cornea opaque/hazy.  Cardiovascular:     Rate and Rhythm: Normal rate and regular rhythm.     Heart sounds: No murmur heard. Pulmonary:     Effort: Pulmonary effort is normal. No respiratory distress.     Breath sounds: Normal breath sounds. No stridor.  Lymphadenopathy:     Cervical: No cervical adenopathy.  Skin:    General: Skin is warm.     Findings: No erythema or rash.  Neurological:     General: No focal deficit present.     Mental Status: She is alert and oriented to person, place, and time.     Motor: Tremor (Left hand more noticeable than right and mild head tremor. Not present at rest.) present. No pronator drift.     Coordination: Coordination normal.     Gait: Gait normal.  Psychiatric:        Mood and Affect: Mood and affect normal.   ASSESSMENT AND PLAN:  Ms. Pippen was seen today for epiphora dn eye pruritus as well as tremor and insomnia.  Insomnia, unspecified type Assessment & Plan: This is a chronic problem.  She has been taking her husband's Ambien  10 mg, 1/4 every night and it helps. Prescription for Ambien  5 mg sent to her pharmacy to continue one half nightly as needed. We discussed  some side effects. Continue good sleep hygiene.  Orders: -     Zolpidem  Tartrate; Take 0.5 tablets (2.5 mg total) by mouth at bedtime as needed for sleep.  Dispense: 15 tablet; Refill: 2  Tremor, unspecified Assessment & Plan: We reviewed differential diagnosis. History and examination suggest benign essential tremor. I do not think medication is needed at this time. Recommend monitoring for new symptoms. We can arrange appt with neurologist at any time if needed or if she still has concerns.  Allergic conjunctivitis of both eyes History and examination do not suggest a serious acute problem. She has history of and stable bilateral keratoconus for which she follows with ophthalmology regularly. Recommend cromolyn eyedrops daily as needed.  Flonase  nasal spray daily as needed for nasal congestion may also help.  -     Cromolyn Sodium; Place 1 drop into both eyes 3 (three) times daily as needed.  Dispense: 10 mL; Refill: 1  Primary hypertension Assessment & Plan: Reporting lower readings at home. Continue amlodipine  5 mg daily and enalapril  20 mg twice daily as well as low-salt diet. Continue monitoring BP regularly.  Cardiomyopathy, unspecified type [facility]) Assessment & Plan: Follows with cardiologist annually.  I personally spent a total of 33 minutes in the care of the patient today including preparing to see the patient, getting/reviewing separately obtained history, performing a medically appropriate exam/evaluation, counseling and educating, and documenting clinical information in the EHR.  Return if symptoms worsen or fail to improve, for keep next appointment.  Ardean Melroy G. [place], MD  [facility]. [place] office.

## 2023-12-31 NOTE — Assessment & Plan Note (Signed)
 Follows with cardiologist annually.

## 2024-01-02 ENCOUNTER — Other Ambulatory Visit: Payer: Self-pay | Admitting: Family Medicine

## 2024-01-02 DIAGNOSIS — I10 Essential (primary) hypertension: Secondary | ICD-10-CM

## 2024-01-09 ENCOUNTER — Emergency Department (HOSPITAL_COMMUNITY)
Admission: EM | Admit: 2024-01-09 | Discharge: 2024-01-09 | Disposition: A | Discharge status: Home / Self Care | Attending: Emergency Medicine | Admitting: Emergency Medicine

## 2024-01-09 ENCOUNTER — Encounter (HOSPITAL_COMMUNITY): Payer: Self-pay | Admitting: Emergency Medicine

## 2024-01-09 DIAGNOSIS — H5712 Ocular pain, left eye: Secondary | ICD-10-CM | POA: Diagnosis not present

## 2024-01-09 DIAGNOSIS — Z7982 Long term (current) use of aspirin: Secondary | ICD-10-CM | POA: Insufficient documentation

## 2024-01-09 DIAGNOSIS — T85398A Other mechanical complication of other ocular prosthetic devices, implants and grafts, initial encounter: Secondary | ICD-10-CM | POA: Diagnosis not present

## 2024-01-09 MED ORDER — FLUORESCEIN SODIUM 1 MG OP STRP
1.0000 | ORAL_STRIP | Freq: Once | OPHTHALMIC | Status: DC
  Filled 2024-01-09: qty 1

## 2024-01-09 MED ORDER — TETRACAINE HCL 0.5 % OP SOLN
2.0000 [drp] | Freq: Once | OPHTHALMIC | Status: DC
  Filled 2024-01-09: qty 4

## 2024-01-09 NOTE — ED Provider Notes (Signed)
  EMERGENCY DEPARTMENT AT [facility] Provider Note   CSN: [id number] Arrival date & time: 01/09/24  0940     Patient presents with: Eye Pain (Left)   Latoya Thompson is a 63 y.o. female.   63 year old female with prior medical history as detailed below presents for evaluation.  Patient with longstanding complex ophthalmologic history.  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  She complains of persistent discomfort in pain to the left eye for the last 1 to 2 months.  She reports that she is having trouble getting into see her primary ophthalmology team at [facility].  She is requesting ophthalmologic evaluation.  The history is provided by the patient and medical records.       Prior to Admission medications   Medication Sig Start Date End Date Taking? Authorizing Provider  amLODipine  (NORVASC ) 5 MG tablet Take 1 tablet by mouth once daily 01/02/24   Jordan, Betty G, MD  aspirin EC 81 MG tablet Take 81 mg by mouth daily.    [provider]  Cholecalciferol (VITAMIN D3) 25 MCG (1000 UT) CAPS Take 1 capsule by mouth 3 (three) times a week.    [provider]  cromolyn (OPTICROM) 4 % ophthalmic solution Place 1 drop into both eyes 3 (three) times daily as needed. 12/31/23   Jordan, Betty G, MD  enalapril  (VASOTEC ) 20 MG tablet Take 1 tablet (20 mg total) by mouth 2 (two) times daily. 12/06/23   Jordan, Betty G, MD  fluticasone  (FLONASE ) 50 MCG/ACT nasal spray Place 2 sprays into both nostrils daily. Patient not taking: Reported on 12/31/2023 07/05/23   Vonna Sharlet POUR, MD  lovastatin  (MEVACOR ) 40 MG tablet TAKE 1 TABLET BY MOUTH AT BEDTIME 07/30/23   Jordan, Betty G, MD  pantoprazole  (PROTONIX ) 20 MG tablet Take 1 tablet by mouth once daily 06/18/23   Jordan, Betty G, MD  sodium fluoride (FLUORISHIELD) 1.1 % GEL dental gel SF 5000 Plus 1.1 % dental  cream  BRUSH THOROUGHLY ONCE DAILY AT BEDTIME    [provider]  vitamin C (ASCORBIC ACID) 500 MG tablet Take 500 mg by mouth daily.    [provider]  vitamin E 400 UNIT capsule Take 400 Units by mouth daily.    [provider]  zolpidem  (AMBIEN ) 5 MG tablet Take 0.5 tablets (2.5 mg total) by mouth at bedtime as needed for sleep. 12/31/23   Jordan, Betty G, MD    Allergies: Codeine    Review of Systems  All other systems reviewed and are negative.   Updated Vital Signs BP (!) 134/93 (BP Location: Left Arm)   Pulse 97   Temp 98.4 F (36.9 C) (Oral)   Resp 18   SpO2 100%   Physical Exam Vitals and nursing note reviewed.  Constitutional:      General: She is not in acute distress.    Appearance: She is well-developed.  HENT:     Head: Normocephalic and atraumatic.  Eyes:     Extraocular Movements: Extraocular movements intact.     Comments: Clouding of the left cornea noted.  See images below.  Cardiovascular:     Rate and Rhythm: Normal rate and regular rhythm.     Heart sounds: No murmur heard. Pulmonary:     Effort: Pulmonary effort is normal. No respiratory distress.     Breath sounds:  Normal breath sounds.  Abdominal:     Palpations: Abdomen is soft.     Tenderness: There is no abdominal tenderness.  Musculoskeletal:        General: No swelling.     Cervical back: Neck supple.  Skin:    General: Skin is warm and dry.     Capillary Refill: Capillary refill takes less than 2 seconds.  Neurological:     Mental Status: She is alert.  Psychiatric:        Mood and Affect: Mood normal.     (all labs ordered are listed, but only abnormal results are displayed) Labs Reviewed - No data to display  EKG: None  Radiology: No results found.   Procedures   Medications Ordered in the ED  fluorescein ophthalmic strip 1 strip (has no administration in time range)  tetracaine (PONTOCAINE) 0.5 % ophthalmic solution 2 drop (has no  administration in time range)                                    Medical Decision Making Patient with complex prior ophthalmologic history.  She complains of chronic left eye pain.  This appears to have been an issue for at least the last month or 2.  She is status post 2 prior corneal transplants with [facility].  She reports that she is having trouble getting into see [facility].  She is requesting ophthalmology evaluation.  Case discussed briefly with Dr. Fleeta, on-call ophthalmology.  She requests that the patient be seen this afternoon in her clinic at [facility].  Patient is agreeable with plan to see Dr. Fleeta in the outpatient setting.  Importance of close follow-up is stressed.  Strict return precautions given understood.  Risk Prescription drug management.        Final diagnoses:  Pain of left eye    ED Discharge Orders     None          Laurice Maude BROCKS, MD 01/09/24 1225

## 2024-01-09 NOTE — Discharge Instructions (Signed)
 Go directly to [facility] - DR VAN - wants to see you today.   Your appointment is at 4:45 PM  [address], [place].   [phone]

## 2024-01-09 NOTE — ED Triage Notes (Signed)
 Pt arriving POV with left eye pain, pt has hx of Keratoconus. Pts specialist has told her she has corneal edema and is unable to see specialist again until January. Pt was instructed to go to the ER.

## 2024-01-17 DIAGNOSIS — T85398A Other mechanical complication of other ocular prosthetic devices, implants and grafts, initial encounter: Secondary | ICD-10-CM | POA: Diagnosis not present

## 2024-01-24 DIAGNOSIS — T85398A Other mechanical complication of other ocular prosthetic devices, implants and grafts, initial encounter: Secondary | ICD-10-CM | POA: Diagnosis not present

## 2024-01-28 ENCOUNTER — Ambulatory Visit (INDEPENDENT_AMBULATORY_CARE_PROVIDER_SITE_OTHER)

## 2024-01-28 VITALS — Ht 63.0 in | Wt 152.0 lb

## 2024-01-28 DIAGNOSIS — Z0000 Encounter for general adult medical examination without abnormal findings: Secondary | ICD-10-CM | POA: Diagnosis not present

## 2024-01-28 NOTE — Patient Instructions (Addendum)
 Latoya Thompson,  Thank you for taking the time for your Medicare Wellness Visit. I appreciate your continued commitment to your health goals. Please review the care plan we discussed, and feel free to reach out if I can assist you further.  Please note that Annual Wellness Visits do not include a physical exam. Some assessments may be limited, especially if the visit was conducted virtually. If needed, we may recommend an in-person follow-up with your provider.  Ongoing Care Seeing your primary care provider every 3 to 6 months helps us  monitor your health and provide consistent, personalized care. Next office visit on 02/11/2024.  You are due for a mammogram, please call to get scheduled.  You are due for a Shingrix vaccine, a tetanus vaccine and a Hep B vaccine.  Remember to discuss with provider.   Referrals If a referral was made during today's visit and you haven't received any updates within two weeks, please contact the referred provider directly to check on the status.  Recommended Screenings:  Health Maintenance  Topic Date Due   Zoster (Shingles) Vaccine (1 of 2) Never done   Hepatitis B Vaccine (2 of 3 - 19+ 3-dose series) 12/27/2016   DTaP/Tdap/Td vaccine (2 - Td or Tdap) 02/08/2022   Breast Cancer Screening  01/28/2024   Medicare Annual Wellness Visit  01/27/2025   Colon Cancer Screening  10/04/2025   Pap with HPV screening  02/20/2028   Pneumococcal Vaccine for age over 25  Completed   Flu Shot  Completed   COVID-19 Vaccine  Completed   Hepatitis C Screening  Completed   HIV Screening  Completed   HPV Vaccine  Aged Out   Meningitis B Vaccine  Aged Out       01/28/2024    3:09 PM  Advanced Directives  Does Patient Have a Medical Advance Directive? No    Vision: Annual vision screenings are recommended for early detection of glaucoma, cataracts, and diabetic retinopathy. These exams can also reveal signs of chronic conditions such as diabetes and high blood  pressure.  Dental: Annual dental screenings help detect early signs of oral cancer, gum disease, and other conditions linked to overall health, including heart disease and diabetes.  Please see the attached documents for additional preventive care recommendations.

## 2024-01-28 NOTE — Progress Notes (Signed)
 Chief Complaint  Patient presents with   Medicare Wellness     Subjective:   Latoya Thompson is a 63 y.o. female who presents for a Medicare Annual Wellness Visit.  Allergies (verified) Codeine   History: Past Medical History:  Diagnosis Date   Cataract    Chicken pox    GERD (gastroesophageal reflux disease)    Hypercholesteremia    Hypertension    Keratoconus    Left   Past Surgical History:  Procedure Laterality Date   BREAST SURGERY     biopsy   CORNEAL TRANSPLANT     EYE SURGERY     Family History  Problem Relation Age of Onset   Hyperlipidemia Mother    Heart disease Mother    Stroke Mother    Hypertension Mother    Diabetes Mother    Hyperlipidemia Father    Heart disease Father    Stroke Father    Hypertension Father    Diabetes Father    Social History   Occupational History   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  Tobacco Use   Smoking status: Never   Smokeless tobacco: Never  Vaping Use   Vaping status: Never Used  Substance and Sexual Activity   Alcohol use: No   Drug use: No   Sexual activity: Yes   Tobacco Counseling Counseling given: Not Answered  SDOH Screenings   Food Insecurity: No Food Insecurity (01/28/2024)  Housing: Unknown (01/28/2024)  Transportation Needs: No Transportation Needs (01/28/2024)  Utilities: Not At Risk (01/28/2024)  Alcohol Screen: Low Risk  (12/30/2023)  Depression (PHQ2-9): Low Risk  (01/28/2024)  Financial Resource Strain: Low Risk  (12/30/2023)  Physical Activity: Sufficiently Active (01/28/2024)  Social Connections: Socially Integrated (01/28/2024)  Stress: No Stress Concern Present (01/28/2024)  Tobacco Use: Low Risk  (01/28/2024)  Health Literacy: Adequate Health Literacy (01/28/2024)   See flowsheets for full screening details  Depression Screen PHQ 2 & 9 Depression Scale- Over the past 2 weeks, how often have you been bothered by any of the following problems? Little interest or pleasure in  doing things: 0 Feeling down, depressed, or hopeless (PHQ Adolescent also includes...irritable): 0 PHQ-2 Total Score: 0 Trouble falling or staying asleep, or sleeping too much: 0 Feeling tired or having little energy: 0 Poor appetite or overeating (PHQ Adolescent also includes...weight loss): 0 Feeling bad about yourself - or that you are a failure or have let yourself or your family down: 0 Trouble concentrating on things, such as reading the newspaper or watching television (PHQ Adolescent also includes...like school work): 0 Moving or speaking so slowly that other people could have noticed. Or the opposite - being so fidgety or restless that you have been moving around a lot more than usual: 0 Thoughts that you would be better off dead, or of hurting yourself in some way: 0 PHQ-9 Total Score: 0 If you checked off any problems, how difficult have these problems made it for you to do your work, take care of things at home, or get along with other people?: Not difficult at all  Depression Treatment Depression Interventions/Treatment : EYV7-0 Score <4 Follow-up Not Indicated     Goals Addressed               This Visit's Progress     COMPLETED: Patient Stated (pt-stated)        Lose 10 lbs.      Patient Stated (pt-stated)        To stay healthy and sane  Visit info / Clinical Intake: Medicare Wellness Visit Type:: Subsequent Annual Wellness Visit Persons participating in visit:: patient Medicare Wellness Visit Mode:: Video Because this visit was a virtual/telehealth visit:: vitals recorded from last visit If Telephone or Video please confirm:: I connected with the patient using audio enabled telemedicine application and verified that I am speaking with the correct person using two identifiers; I discussed the limitations of evaluation and management by telemedicine Patient Location:: Home Provider Location:: Home Information given by:: patient Interpreter Needed?:  No Pre-visit prep was completed: no AWV questionnaire completed by patient prior to visit?: no Living arrangements:: lives with spouse/significant other Patient's Overall Health Status Rating: very good Typical amount of pain: none Does pain affect daily life?: no Are you currently prescribed opioids?: no  Dietary Habits and Nutritional Risks How many meals a day?: 3 Eats fruit and vegetables daily?: yes Most meals are obtained by: preparing own meals In the last 2 weeks, have you had any of the following?: none Diabetic:: no  Functional Status Activities of Daily Living (to include ambulation/medication): Independent Ambulation: Independent with device- listed below Home Assistive Devices/Equipment: Eyeglasses Medication Administration: Independent Home Management: Independent Manage your own finances?: yes Primary transportation is: driving Concerns about vision?: (!) yes (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) Concerns about hearing?: no  Fall Screening Falls in the past year?: 0 Number of falls in past year: 0 Was there an injury with Fall?: 0 Fall Risk Category Calculator: 0 Patient Fall Risk Level: Low Fall Risk  Fall Risk Patient at Risk for Falls Due to: No Fall Risks Fall risk Follow up: Falls evaluation completed; Falls prevention discussed  Home and Transportation Safety: All rugs have non-skid backing?: N/A, no rugs All stairs or steps have railings?: yes Grab bars in the bathtub or shower?: (!) no Have non-skid surface in bathtub or shower?: yes (shower stool) Good home lighting?: yes Regular seat belt use?: yes Hospital stays in the last year:: no  Cognitive Assessment Difficulty concentrating, remembering, or making decisions? : no Will 6CIT or Mini Cog be Completed: no 6CIT or Mini Cog Declined: patient alert, oriented, able to answer questions appropriately and recall recent events  Advance Directives (For Healthcare) Does Patient Have a  Medical Advance Directive?: No  Reviewed/Updated  Reviewed/Updated: Reviewed All (Medical, Surgical, Family, Medications, Allergies, Care Teams, Patient Goals)        Objective:    Today's Vitals   01/28/24 1505  Weight: 152 lb (68.9 kg)  Height: 5' 3 (1.6 m)   Body mass index is 26.93 kg/m.  Current Medications (verified) Outpatient Encounter Medications as of 01/28/2024  Medication Sig   amLODipine  (NORVASC ) 5 MG tablet Take 1 tablet by mouth once daily   aspirin EC 81 MG tablet Take 81 mg by mouth daily.   Cholecalciferol (VITAMIN D3) 25 MCG (1000 UT) CAPS Take 1 capsule by mouth 3 (three) times a week.   cromolyn (OPTICROM) 4 % ophthalmic solution Place 1 drop into both eyes 3 (three) times daily as needed.   enalapril  (VASOTEC ) 20 MG tablet Take 1 tablet (20 mg total) by mouth 2 (two) times daily.   fluticasone  (FLONASE ) 50 MCG/ACT nasal spray Place 2 sprays into both nostrils daily.   lovastatin  (MEVACOR ) 40 MG tablet TAKE 1 TABLET BY MOUTH AT BEDTIME   pantoprazole  (PROTONIX ) 20 MG tablet Take 1 tablet by mouth once daily   sodium fluoride (FLUORISHIELD) 1.1 % GEL dental gel SF 5000 Plus 1.1 % dental cream  BRUSH THOROUGHLY ONCE DAILY AT BEDTIME   vitamin C (ASCORBIC ACID) 500 MG tablet Take 500 mg by mouth daily.   vitamin E 400 UNIT capsule Take 400 Units by mouth daily.   zolpidem  (AMBIEN ) 5 MG tablet Take 0.5 tablets (2.5 mg total) by mouth at bedtime as needed for sleep.   No facility-administered encounter medications on file as of 01/28/2024.   Hearing/Vision screen Hearing Screening - Comments:: Denies hearing difficulties   Vision Screening - Comments:: Wears eyeglasses/[facility]/Dr. Genginack/UTD Immunizations and Health Maintenance Health Maintenance  Topic Date Due   Zoster Vaccines- Shingrix (1 of 2) Never done   Hepatitis B Vaccines 19-59 Average Risk (2 of 3 - 19+ 3-dose series) 12/27/2016   DTaP/Tdap/Td (2 - Td or Tdap) 02/08/2022    Mammogram  01/28/2024   Medicare Annual Wellness (AWV)  01/27/2025   Colonoscopy  10/04/2025   Cervical Cancer Screening (HPV/Pap Cotest)  02/20/2028   Pneumococcal Vaccine: 50+ Years  Completed   Influenza Vaccine  Completed   COVID-19 Vaccine  Completed   Hepatitis C Screening  Completed   HIV Screening  Completed   HPV VACCINES  Aged Out   Meningococcal B Vaccine  Aged Out        Assessment/Plan:  This is a routine wellness examination for Keleigh.  Patient Care Team: Jordan, Betty G, MD as PCP - General (Family Medicine) Delford Maude BROCKS, MD as PCP - Cardiology (Cardiology) Ruth, Jon DEL, [facility] (Pharmacist)  I have personally reviewed and noted the following in the patient's chart:   Medical and social history Use of alcohol, tobacco or illicit drugs  Current medications and supplements including opioid prescriptions. Functional ability and status Nutritional status Physical activity Advanced directives List of other physicians Hospitalizations, surgeries, and ER visits in previous 12 months Vitals Screenings to include cognitive, depression, and falls Referrals and appointments  No orders of the defined types were placed in this encounter.  In addition, I have reviewed and discussed with patient certain preventive protocols, quality metrics, and best practice recommendations. A written personalized care plan for preventive services as well as general preventive health recommendations were provided to patient.   Oneida Mckamey L Sissi Padia, CMA   01/28/2024   Return in 1 year (on 01/27/2025).  After Visit Summary: (MyChart) Due to this being a telephonic visit, the after visit summary with patients personalized plan was offered to patient via MyChart   Nurse Notes: Patient is due for a mammogram and stated that she will call to get scheduled.  She is due for a shingrix vaccine, a tdap and a Hep B vaccine.  Patient had no other concerns to address today.

## 2024-02-11 ENCOUNTER — Ambulatory Visit: Admitting: Family Medicine

## 2024-02-15 DIAGNOSIS — T85398A Other mechanical complication of other ocular prosthetic devices, implants and grafts, initial encounter: Secondary | ICD-10-CM | POA: Diagnosis not present

## 2024-02-18 ENCOUNTER — Encounter: Payer: Self-pay | Admitting: Family Medicine

## 2024-02-18 ENCOUNTER — Ambulatory Visit: Payer: Self-pay | Admitting: Family Medicine

## 2024-02-18 ENCOUNTER — Ambulatory Visit: Admitting: Family Medicine

## 2024-02-18 VITALS — BP 135/85 | HR 83 | Temp 98.5°F | Resp 16 | Ht 62.0 in | Wt 151.0 lb

## 2024-02-18 DIAGNOSIS — I10 Essential (primary) hypertension: Secondary | ICD-10-CM | POA: Diagnosis not present

## 2024-02-18 DIAGNOSIS — E785 Hyperlipidemia, unspecified: Secondary | ICD-10-CM

## 2024-02-18 DIAGNOSIS — N1831 Chronic kidney disease, stage 3a: Secondary | ICD-10-CM | POA: Diagnosis not present

## 2024-02-18 DIAGNOSIS — N183 Chronic kidney disease, stage 3 unspecified: Secondary | ICD-10-CM | POA: Insufficient documentation

## 2024-02-18 LAB — COMPREHENSIVE METABOLIC PANEL WITH GFR
ALT: 15 U/L (ref 0–35)
AST: 24 U/L (ref 0–37)
Albumin: 4.3 g/dL (ref 3.5–5.2)
Alkaline Phosphatase: 98 U/L (ref 39–117)
BUN: 18 mg/dL (ref 6–23)
CO2: 28 meq/L (ref 19–32)
Calcium: 9.3 mg/dL (ref 8.4–10.5)
Chloride: 107 meq/L (ref 96–112)
Creatinine, Ser: 0.91 mg/dL (ref 0.40–1.20)
GFR: 67.04 mL/min (ref >60.00)
Glucose, Bld: 85 mg/dL (ref 70–99)
Potassium: 3.7 meq/L (ref 3.5–5.1)
Sodium: 144 meq/L (ref 135–145)
Total Bilirubin: 0.6 mg/dL (ref 0.2–1.2)
Total Protein: 7.4 g/dL (ref 6.0–8.3)

## 2024-02-18 LAB — LIPID PANEL
Cholesterol: 172 mg/dL (ref 0–200)
HDL: 50 mg/dL (ref >39.00)
LDL Cholesterol: 106 mg/dL — ABNORMAL HIGH (ref 0–99)
NonHDL: 121.5
Total CHOL/HDL Ratio: 3
Triglycerides: 76 mg/dL (ref 0.0–149.0)
VLDL: 15.2 mg/dL (ref 0.0–40.0)

## 2024-02-18 LAB — CBC
HCT: 42.7 % (ref 36.0–46.0)
Hemoglobin: 14.2 g/dL (ref 12.0–15.0)
MCHC: 33.3 g/dL (ref 30.0–36.0)
MCV: 86.6 fl (ref 78.0–100.0)
Platelets: 188 K/uL (ref 150.0–400.0)
RBC: 4.94 Mil/uL (ref 3.87–5.11)
RDW: 14 % (ref 11.5–15.5)
WBC: 5.2 K/uL (ref 4.0–10.5)

## 2024-02-18 LAB — VITAMIN D 25 HYDROXY (VIT D DEFICIENCY, FRACTURES): VITD: 31.32 ng/mL (ref 30.00–100.00)

## 2024-02-18 LAB — MICROALBUMIN / CREATININE URINE RATIO
Creatinine,U: 98.2 mg/dL
Microalb Creat Ratio: UNDETERMINED mg/g (ref 0.0–30.0)
Microalb, Ur: <0.7 mg/dL

## 2024-02-18 NOTE — Progress Notes (Signed)
 Chief Complaint  Patient presents with   Medical Management of Chronic Issues    Patient asking for labs to be drawn.    Discussed the use of AI scribe software for clinical note transcription with the patient, who gave verbal consent to proceed. History of Present Illness Latoya Thompson is a 63 year old female with past medical history significant for hypertension, hyperlipidemia, prediabetes, and unstable keratoconus of both eyes who presents for chronic management and questing blood work.  She visited the emergency department due to pain with her left eye, problem is back to baseline. She is under the care of an ophthalmologist until she sees a cornea specialist in January/2026.  Her weight has remained stable at 151 pounds. She wants to see a nutritionist to help manage her diet. She is mindful of her diet but seeks more specific guidance.  Hypertension: Her renal function has been mildly abnormal but stable otherwise. No foam in urine, gross hematuria,or decreased urine output. Her renal function has been stable 53, 58, 56, and 55.  Her blood pressure is usually normal at home, around 118/85 or 80, but occasionally SBP rises to 135. She is currently taking amlodipine  5 mg daily and enalapril  20 mg twice daily.  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 These headaches resolve spontaneously without medication.  Negative for exertional chest pain, dyspnea,  focal weakness, or edema.  Lab Results  Component Value Date   NA 142 10/08/2023   CL 107 10/08/2023   K 4.0 10/08/2023   CO2 24 10/08/2023   BUN 16 10/08/2023   CREATININE 1.05 10/08/2023   GFR 56.61 (L) 10/08/2023   CALCIUM 9.7 10/08/2023   ALBUMIN 4.4 04/09/2023   GLUCOSE 88 10/08/2023   Hyperlipidemia: Currently she is on lovastatin  40 mg daily.  Lab Results  Component Value Date   CHOL 201 (H) 10/08/2023    HDL 51.90 10/08/2023   LDLCALC 130 (H) 10/08/2023   TRIG 95.0 10/08/2023   CHOLHDL 4 10/08/2023   Lab Results  Component Value Date   HGBA1C 5.7 10/08/2023   Review of Systems  Constitutional:  Negative for activity change, appetite change, chills and fever.  HENT:  Negative for sore throat.   Respiratory:  Negative for cough and wheezing.   Gastrointestinal:  Negative for abdominal pain, nausea and vomiting.  Genitourinary:  Negative for decreased urine volume, dysuria and hematuria.  Skin:  Negative for rash.  Neurological:  Negative for syncope and facial asymmetry.  See other pertinent positives and negatives in HPI.  Current Outpatient Medications on File Prior to Visit  Medication Sig Dispense Refill   amLODipine  (NORVASC ) 5 MG tablet Take 1 tablet by mouth once daily 90 tablet 0   aspirin EC 81 MG tablet Take 81 mg by mouth daily.     Cholecalciferol (VITAMIN D3) 25 MCG (1000 UT) CAPS Take 1 capsule by mouth 3 (three) times a week.     cromolyn  (OPTICROM ) 4 % ophthalmic solution Place 1 drop into both eyes 3 (three) times daily as needed. 10 mL 1   enalapril  (VASOTEC ) 20 MG tablet Take 1 tablet (20 mg total) by mouth 2 (two) times daily. 180 tablet 2   fluticasone  (FLONASE ) 50 MCG/ACT nasal spray Place 2 sprays into both nostrils daily. 16 g 0   lovastatin  (MEVACOR ) 40 MG tablet TAKE 1 TABLET BY MOUTH AT BEDTIME 90 tablet  2   pantoprazole  (PROTONIX ) 20 MG tablet Take 1 tablet by mouth once daily 90 tablet 1   sodium fluoride (FLUORISHIELD) 1.1 % GEL dental gel SF 5000 Plus 1.1 % dental cream  BRUSH THOROUGHLY ONCE DAILY AT BEDTIME     vitamin C (ASCORBIC ACID) 500 MG tablet Take 500 mg by mouth daily.     vitamin E 400 UNIT capsule Take 400 Units by mouth daily.     zolpidem  (AMBIEN ) 5 MG tablet Take 0.5 tablets (2.5 mg total) by mouth at bedtime as needed for sleep. 15 tablet 2   No current facility-administered medications on file prior to visit.   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 /2025   Drinks 1/2 cup of coffee a day, rare soda or tea. Exercise regularly for at least 60 minutes.   Social Drivers of Corporate Investment Banker Strain: Low Risk  (12/30/2023)   Overall Financial Resource Strain (CARDIA)    Difficulty of Paying Living Expenses: Not hard at all  Food Insecurity: No Food Insecurity (01/28/2024)   Hunger Vital Sign    Worried About Running Out of Food in the Last Year: Never true    Ran Out of Food in the Last Year: Never true  Transportation Needs: No Transportation Needs (01/28/2024)   PRAPARE - Administrator, Civil Service (Medical): No    Lack of Transportation (Non-Medical): No  Physical Activity: Sufficiently Active (01/28/2024)   Exercise Vital Sign    Days of Exercise per Week: 3 days    Minutes of Exercise per Session: 120 min  Stress: No Stress Concern Present (01/28/2024)   [employer] of Occupational Health - Occupational Stress Questionnaire    Feeling of Stress: Only a little  Social Connections: Socially Integrated (01/28/2024)   Social Connection and Isolation Panel    Frequency of Communication with  Friends and Family: Three times a week    Frequency of Social Gatherings with Friends and Family: Never    Attends Religious Services: More than 4 times per year    Active Member of Clubs or Organizations: Yes    Attends Banker Meetings: More than 4 times per year    Marital Status: Married    Today's Vitals   02/18/24 0820 02/18/24 0849  BP: (!) 140/92 135/85  Pulse: 83   Resp: 16   Temp: 98.5 F (36.9 C)   TempSrc: Oral   SpO2: 98%   Weight: 151 lb (68.5 kg)   Height: 5' 2 (1.575 m)  Body mass index is 27.62 kg/m.  Physical Exam Vitals and nursing note reviewed.  Constitutional:      General: She is not in acute distress.    Appearance: She is well-developed.  HENT:     Head: Normocephalic and atraumatic.     Right Ear: Ear canal normal.     Mouth/Throat:     Mouth: Mucous membranes are moist.     Pharynx: Oropharynx is clear.  Eyes:     Conjunctiva/sclera: Conjunctivae normal.     Comments: Right conjunctiva is not injected. No exudate or hemorrhage. Left cornea opaque/hazy.   Cardiovascular:     Rate and Rhythm: Normal rate and regular rhythm.     Heart sounds: No murmur heard. Pulmonary:     Effort: Pulmonary effort is normal. No respiratory distress.     Breath sounds: Normal breath sounds.  Abdominal:     Palpations: Abdomen is soft. There is no mass.     Tenderness: There is no abdominal tenderness.  Lymphadenopathy:     Cervical: No cervical adenopathy.  Skin:    General: Skin is warm.     Findings: No erythema or rash.  Neurological:     General: No focal deficit present.     Mental Status: She is alert and oriented to person, place, and time.     Motor: Tremor (Mild affecting left hand, not present at rest.) present.     Gait: Gait normal.  Psychiatric:        Mood and Affect: Mood and affect normal.   ASSESSMENT AND PLAN:  Ms. Oaklee Esther was seen today for medical management of chronic issues.  Diagnoses and all orders  for this visit: Orders Placed This Encounter  Procedures   Comprehensive metabolic panel with GFR   CBC   Microalbumin / creatinine urine ratio   VITAMIN D  25 Hydroxy (Vit-D Deficiency, Fractures)   Lipid panel   Consult to Registered Dietitian   Lab Results  Component Value Date   CHOL 172 02/18/2024   HDL 50.00 02/18/2024   LDLCALC 106 (H) 02/18/2024   TRIG 76.0 02/18/2024   CHOLHDL 3 02/18/2024   Lab Results  Component Value Date   NA 144 02/18/2024   CL 107 02/18/2024   K 3.7 02/18/2024   CO2 28 02/18/2024   BUN 18 02/18/2024   CREATININE 0.91 02/18/2024   GFR 67.04 02/18/2024   CALCIUM 9.3 02/18/2024   ALBUMIN 4.3 02/18/2024   GLUCOSE 85 02/18/2024   Lab Results  Component Value Date   ALT 15 02/18/2024   AST 24 02/18/2024   ALKPHOS 98 02/18/2024   BILITOT 0.6 02/18/2024   Lab Results  Component Value Date   MICROALBUR <0.7 02/18/2024   Lab Results  Component Value Date   WBC 5.2 02/18/2024   HGB 14.2 02/18/2024   HCT 42.7 02/18/2024   MCV 86.6 02/18/2024   PLT 188.0 02/18/2024   Stage 3a chronic kidney disease (HCC) Assessment & Plan: We discussed diagnosis. Continue adequate hydration, low-salt diet, and avoidance of NSAIDs as well as adequate BP control. Currently on enalapril .  Orders: -     Comprehensive metabolic panel with GFR; Future -     CBC; Future -     Microalbumin / creatinine urine ratio; Future -     VITAMIN D  25 Hydroxy (Vit-D Deficiency, Fractures); Future  Primary hypertension Assessment & Plan: BP mildly elevated today, reporting lower readings at home. Continue amlodipine  5 mg daily and enalapril  20  mg twice daily as well as low-salt diet. Continue monitoring BP regularly. Eye exam is current. Follow-up in 6 months.  Orders: -     Comprehensive metabolic panel with GFR; Future  Hyperlipidemia, unspecified hyperlipidemia type Assessment & Plan: Currently on lovastatin  40 mg daily. Continue low-fat diet. Appointment  with nutritionist will be arranged. Further recommendation will be given according to lipid panel result.  Orders: -     Consult to Registered Dietitian -     Comprehensive metabolic panel with GFR; Future -     Lipid panel; Future   Return in about 6 months (around 08/18/2024).  Kendall Arnell, MD [facility]. [place] office.

## 2024-02-18 NOTE — Assessment & Plan Note (Addendum)
 Currently on lovastatin  40 mg daily. Continue low-fat diet. Appointment with nutritionist will be arranged. Further recommendation will be given according to lipid panel result.

## 2024-02-18 NOTE — Assessment & Plan Note (Signed)
 BP mildly elevated today, reporting lower readings at home. Continue amlodipine  5 mg daily and enalapril  20 mg twice daily as well as low-salt diet. Continue monitoring BP regularly. Eye exam is current. Follow-up in 6 months.

## 2024-02-18 NOTE — Assessment & Plan Note (Signed)
 We discussed diagnosis. Continue adequate hydration, low-salt diet, and avoidance of NSAIDs as well as adequate BP control. Currently on enalapril .

## 2024-02-18 NOTE — Patient Instructions (Addendum)
 A few things to remember from today's visit:  Primary hypertension - Plan: Comprehensive metabolic panel with GFR  Stage 3a chronic kidney disease (HCC) - Plan: Comprehensive metabolic panel with GFR, CBC, Microalbumin / creatinine urine ratio, VITAMIN D  25 Hydroxy (Vit-D Deficiency, Fractures)  Hyperlipidemia, unspecified hyperlipidemia type - Plan: Consult to Registered Dietitian, Comprehensive metabolic panel with GFR, Lipid panel  No changes today.  If you need refills for medications you take chronically, please call your pharmacy. Do not use My Chart to request refills or for acute issues that need immediate attention. If you send a my chart message, it may take a few days to be addressed, specially if I am not in the office.  Please be sure medication list is accurate. If a new problem present, please set up appointment sooner than planned today.

## 2024-02-22 LAB — HM MAMMOGRAPHY

## 2024-03-03 NOTE — Progress Notes (Signed)
 "    Date:  03/11/2024   ID:  Latoya Thompson, DOB May 17, 1960, MRN [id number]  PCP:  Jordan, Betty G, MD  Cardiologist:   [place] Electrophysiologist:  None   Evaluation Performed:  Follow-Up Visit  History of Present Illness:    63 y.o.history of blindness, LBBB, HTN, HLD DM-2 and mild DCM with EF 45-50% by echo 05/01/16 stable since echo 2015.  She had myovue 07/22/13 which was non ischemic She complained of dyspnea but BNP was normal at 24.  Carotid duplex done for tinnitus showed plaque no stenosis 01/01/18 She is on disability due to her blindness.  Also with some left sided tremors   TTE 05/10/20 stable EF 45-50% mild MR abnormal septal motion mild LVH TTE 10/17/22  EF normalized 55-60% mild LVH grade 2 diastolic Trivial MR/AR  Functional class one no symptoms Compliant with meds   See [facility] for corneal transplant has had mild tremor in Right hand discussed primidone  with primary but deferred   No cardiac complaints Still has not f/u with neurologist  Husband had kidney transplant [facility] 7 years ago. He is visually impaired   Past Medical History:  Diagnosis Date   Cataract    Chicken pox    GERD (gastroesophageal reflux disease)    Hypercholesteremia    Hypertension    Keratoconus    Left   Past Surgical History:  Procedure Laterality Date   BREAST SURGERY     biopsy   CORNEAL TRANSPLANT     EYE SURGERY       Current Meds  Medication Sig   amLODipine  (NORVASC ) 5 MG tablet Take 1 tablet by mouth once daily   aspirin EC 81 MG tablet Take 81 mg by mouth daily.   Cholecalciferol (VITAMIN D3) 25 MCG (1000 UT) CAPS Take 1 capsule by mouth 3 (three) times a week.   cromolyn  (OPTICROM ) 4 % ophthalmic solution Place 1 drop into both eyes 3 (three) times daily as needed.   enalapril  (VASOTEC ) 20 MG tablet Take 1 tablet (20 mg total) by mouth 2 (two) times daily.   fluticasone  (FLONASE ) 50 MCG/ACT nasal spray Place 2 sprays into both nostrils daily.   lovastatin  (MEVACOR )  40 MG tablet TAKE 1 TABLET BY MOUTH AT BEDTIME   neomycin-polymyxin b-dexamethasone (MAXITROL) 3.5-10000-0.1 OINT Apply 0.5 inches to eye.   ofloxacin (OCUFLOX) 0.3 % ophthalmic solution Apply 1 drop to eye.   pantoprazole  (PROTONIX ) 20 MG tablet Take 1 tablet by mouth once daily   sodium fluoride (FLUORISHIELD) 1.1 % GEL dental gel SF 5000 Plus 1.1 % dental cream  BRUSH THOROUGHLY ONCE DAILY AT BEDTIME   vitamin C (ASCORBIC ACID) 500 MG tablet Take 500 mg by mouth daily.   vitamin E 400 UNIT capsule Take 400 Units by mouth daily.   zolpidem  (AMBIEN ) 5 MG tablet Take 0.5 tablets (2.5 mg total) by mouth at bedtime as needed for sleep.     Allergies:   Codeine   Social History   Tobacco Use   Smoking status: Never   Smokeless tobacco: Never  Vaping Use   Vaping status: Never Used  Substance Use Topics   Alcohol use: No   Drug use: No     Family Hx: The patient's family history includes Diabetes in her father and mother; Heart disease in her father and mother; Hyperlipidemia in her father and mother; Hypertension in her father and mother; Stroke in her father and mother.  ROS:   Please see the history of  present illness.     All other systems reviewed and are negative.   Prior CV studies:   The following studies were reviewed today:  Carotid Duplex 12/27/17 Echo: 05/01/16 Myovue 07/22/13   Labs/Other Tests and Data Reviewed:    EKG:  03/11/2024 SR Rate 68 LBBB no changes   Recent Labs: 02/18/2024: ALT 15; BUN 18; Creatinine, Ser 0.91; Hemoglobin 14.2; Platelets 188.0; Potassium 3.7; Sodium 144   Recent Lipid Panel Lab Results  Component Value Date/Time   CHOL 172 02/18/2024 08:52 AM   TRIG 76.0 02/18/2024 08:52 AM   HDL 50.00 02/18/2024 08:52 AM   CHOLHDL 3 02/18/2024 08:52 AM   LDLCALC 106 (H) 02/18/2024 08:52 AM   LDLCALC 124 (H) 11/26/2019 08:31 AM    Wt Readings from Last 3 Encounters:  03/11/24 150 lb (68 kg)  02/18/24 151 lb (68.5 kg)  01/28/24 152 lb  (68.9 kg)     Objective:    Vital Signs:  BP 106/78 (BP Location: Left Arm, Patient Position: Sitting, Cuff Size: Normal)   Pulse 80   Ht 5' 2 (1.575 m)   Wt 150 lb (68 kg)   SpO2 97%   BMI 27.44 kg/m    Affect appropriate Healthy:  appears stated age HEENT:Blind  Neck supple with no adenopathy JVP normal no bruits no thyromegaly Lungs clear with no wheezing and good diaphragmatic motion Heart:  S1/S2 no murmur, no rub, gallop or click PMI normal Abdomen: benighn, BS positve, no tenderness, no AAA no bruit.  No HSM or HJR Distal pulses intact with no bruits No edema Neuro non-focal Skin warm and dry No muscular weakness    ASSESSMENT & PLAN:    1. DCM:  Stable EF normalized on TTE 10/17/22  2. LBBB: chronic f/u ECG in a year  3. HTN:  Well controlled continue vasotec  and norvasc   4. HLD:  On statin labs with primary LDL 106 Suggested cardiac calcium score to further assess need for stricter Rx 5. Tinnitus :  F/u with primary and ENT as well as her blindness and tremors MRI done 12/15/22 with mild cerebellar tonsillar ectopia not meeting criteria for Chiari 1 malformation f/u neuro  COVID-19 Education: The signs and symptoms of COVID-19 were discussed with the patient and how to seek care for testing (follow up with PCP or arrange E-visit).  The importance of social distancing was discussed today.  Medication Adjustments/Labs and Tests Ordered: Current medicines are reviewed at length with the patient today.  Concerns regarding medicines are outlined above.   Tests Ordered:  Calcium score   Medication Changes:  None   Disposition:  Follow up in a year  Signed, Maude Emmer, MD  03/11/2024 9:25 AM     [facility] "

## 2024-03-11 ENCOUNTER — Ambulatory Visit: Payer: Self-pay | Admitting: Cardiovascular Disease

## 2024-03-11 ENCOUNTER — Ambulatory Visit: Admitting: Cardiovascular Disease

## 2024-03-11 ENCOUNTER — Ambulatory Visit
Admission: RE | Admit: 2024-03-11 | Discharge: 2024-03-11 | Disposition: A | Discharge status: Home / Self Care | Payer: Self-pay | Source: Ambulatory Visit | Attending: Cardiovascular Disease | Admitting: Cardiovascular Disease

## 2024-03-11 VITALS — BP 106/78 | HR 80 | Ht 62.0 in | Wt 150.0 lb

## 2024-03-11 DIAGNOSIS — I420 Dilated cardiomyopathy: Secondary | ICD-10-CM | POA: Insufficient documentation

## 2024-03-11 DIAGNOSIS — E785 Hyperlipidemia, unspecified: Secondary | ICD-10-CM | POA: Insufficient documentation

## 2024-03-11 DIAGNOSIS — I447 Left bundle-branch block, unspecified: Secondary | ICD-10-CM | POA: Insufficient documentation

## 2024-03-11 DIAGNOSIS — I10 Essential (primary) hypertension: Secondary | ICD-10-CM

## 2024-03-11 DIAGNOSIS — Z79899 Other long term (current) drug therapy: Secondary | ICD-10-CM

## 2024-03-11 MED ORDER — ROSUVASTATIN CALCIUM 20 MG PO TABS: 20.0000 mg | ORAL_TABLET | Freq: Every day | ORAL | 3 refills | Status: AC

## 2024-03-11 NOTE — Patient Instructions (Signed)
 Medication Instructions:  No medication changes were made at this visit. Continue current regimen.   *If you need a refill on your cardiac medications before your next appointment, please call your pharmacy*  Lab Work: None ordered today. If you have labs (blood work) drawn today and your tests are completely normal, you will receive your results only by: MyChart Message (if you have MyChart) OR A paper copy in the mail If you have any lab test that is abnormal or we need to change your treatment, we will call you to review the results.  Testing/Procedures: Your physician has requested that you have a coronary calcium score performed. This is not covered by insurance and will be an out-of-pocket cost of approximately $99.   Follow-Up: At [facility], you and your health needs are our priority.  As part of our continuing mission to provide you with exceptional heart care, our providers are all part of one team.  This team includes your primary Cardiologist (physician) and Advanced Practice Providers or APPs (Physician Assistants and Nurse Practitioners) who all work together to provide you with the care you need, when you need it.  Your next appointment:   1 year(s)  Provider:   Maude Emmer, MD

## 2024-03-14 NOTE — Progress Notes (Signed)
 Latoya Thompson                                          MRN: [id number]   03/14/2024   The [facility] Quality Team Specialist reviewed this patient medical record for the purposes of chart review for care gap closure. The following were reviewed: abstraction for care gap closure-controlling blood pressure.    [facility] Quality Team

## 2024-03-29 ENCOUNTER — Other Ambulatory Visit: Payer: Self-pay | Admitting: Family Medicine

## 2024-03-29 DIAGNOSIS — I10 Essential (primary) hypertension: Secondary | ICD-10-CM

## 2024-04-15 ENCOUNTER — Other Ambulatory Visit: Payer: Self-pay | Admitting: Family Medicine

## 2024-08-18 ENCOUNTER — Ambulatory Visit: Admitting: Family Medicine
